# Patient Record
Sex: Female | Born: 1935 | Race: White | Hispanic: No | Smoking: Never smoker
Health system: Southern US, Community
[De-identification: ages and names within clinical notes are randomized; demographics above are authoritative.]

## PROBLEM LIST (undated history)

## (undated) DIAGNOSIS — K219 Gastro-esophageal reflux disease without esophagitis: Secondary | ICD-10-CM

## (undated) DIAGNOSIS — I350 Nonrheumatic aortic (valve) stenosis: Secondary | ICD-10-CM

## (undated) DIAGNOSIS — I10 Essential (primary) hypertension: Secondary | ICD-10-CM

## (undated) DIAGNOSIS — R9430 Abnormal result of cardiovascular function study, unspecified: Secondary | ICD-10-CM

## (undated) DIAGNOSIS — Z7901 Long term (current) use of anticoagulants: Secondary | ICD-10-CM

## (undated) DIAGNOSIS — I5030 Unspecified diastolic (congestive) heart failure: Secondary | ICD-10-CM

## (undated) DIAGNOSIS — E7800 Pure hypercholesterolemia, unspecified: Secondary | ICD-10-CM

## (undated) DIAGNOSIS — H3530 Unspecified macular degeneration: Secondary | ICD-10-CM

## (undated) DIAGNOSIS — I480 Paroxysmal atrial fibrillation: Secondary | ICD-10-CM

## (undated) DIAGNOSIS — M1990 Unspecified osteoarthritis, unspecified site: Secondary | ICD-10-CM

## (undated) DIAGNOSIS — G459 Transient cerebral ischemic attack, unspecified: Secondary | ICD-10-CM

## (undated) HISTORY — DX: Paroxysmal atrial fibrillation: I48.0

## (undated) HISTORY — DX: Unspecified diastolic (congestive) heart failure: I50.30

## (undated) HISTORY — DX: Nonrheumatic aortic (valve) stenosis: I35.0

## (undated) HISTORY — DX: Transient cerebral ischemic attack, unspecified: G45.9

## (undated) HISTORY — DX: Abnormal result of cardiovascular function study, unspecified: R94.30

## (undated) HISTORY — DX: Long term (current) use of anticoagulants: Z79.01

## (undated) HISTORY — DX: Gastro-esophageal reflux disease without esophagitis: K21.9

## (undated) HISTORY — DX: Unspecified osteoarthritis, unspecified site: M19.90

---

## 1998-01-09 ENCOUNTER — Other Ambulatory Visit: Admission: RE | Admit: 1998-01-09 | Discharge: 1998-01-09 | Payer: Self-pay | Admitting: *Deleted

## 1999-08-07 ENCOUNTER — Other Ambulatory Visit: Admission: RE | Admit: 1999-08-07 | Discharge: 1999-08-07 | Payer: Self-pay | Admitting: *Deleted

## 1999-09-15 ENCOUNTER — Ambulatory Visit (HOSPITAL_COMMUNITY): Admission: RE | Admit: 1999-09-15 | Discharge: 1999-09-15 | Payer: Self-pay | Admitting: Ophthalmology

## 1999-10-14 ENCOUNTER — Ambulatory Visit (HOSPITAL_COMMUNITY): Admission: RE | Admit: 1999-10-14 | Discharge: 1999-10-14 | Payer: Self-pay | Admitting: *Deleted

## 2000-09-14 ENCOUNTER — Encounter: Payer: Self-pay | Admitting: Internal Medicine

## 2000-09-14 ENCOUNTER — Inpatient Hospital Stay (HOSPITAL_COMMUNITY): Admission: AD | Admit: 2000-09-14 | Discharge: 2000-09-30 | Payer: Self-pay | Admitting: Internal Medicine

## 2000-09-14 ENCOUNTER — Encounter (INDEPENDENT_AMBULATORY_CARE_PROVIDER_SITE_OTHER): Payer: Self-pay

## 2000-09-14 ENCOUNTER — Encounter (INDEPENDENT_AMBULATORY_CARE_PROVIDER_SITE_OTHER): Payer: Self-pay | Admitting: Specialist

## 2000-09-15 ENCOUNTER — Encounter: Payer: Self-pay | Admitting: Internal Medicine

## 2000-09-17 ENCOUNTER — Encounter: Payer: Self-pay | Admitting: Nephrology

## 2000-11-03 ENCOUNTER — Other Ambulatory Visit: Admission: RE | Admit: 2000-11-03 | Discharge: 2000-11-03 | Payer: Self-pay | Admitting: *Deleted

## 2000-12-28 ENCOUNTER — Ambulatory Visit (HOSPITAL_COMMUNITY): Admission: RE | Admit: 2000-12-28 | Discharge: 2000-12-28 | Payer: Self-pay | Admitting: *Deleted

## 2001-11-28 ENCOUNTER — Other Ambulatory Visit: Admission: RE | Admit: 2001-11-28 | Discharge: 2001-11-28 | Payer: Self-pay | Admitting: *Deleted

## 2002-02-23 ENCOUNTER — Ambulatory Visit (HOSPITAL_COMMUNITY): Admission: RE | Admit: 2002-02-23 | Discharge: 2002-02-23 | Payer: Self-pay | Admitting: *Deleted

## 2002-05-17 ENCOUNTER — Encounter: Payer: Self-pay | Admitting: Ophthalmology

## 2002-05-22 ENCOUNTER — Ambulatory Visit (HOSPITAL_COMMUNITY): Admission: RE | Admit: 2002-05-22 | Discharge: 2002-05-22 | Payer: Self-pay | Admitting: Ophthalmology

## 2002-12-04 ENCOUNTER — Other Ambulatory Visit: Admission: RE | Admit: 2002-12-04 | Discharge: 2002-12-04 | Payer: Self-pay | Admitting: *Deleted

## 2003-03-01 ENCOUNTER — Ambulatory Visit (HOSPITAL_COMMUNITY): Admission: RE | Admit: 2003-03-01 | Discharge: 2003-03-01 | Payer: Self-pay | Admitting: *Deleted

## 2004-01-21 ENCOUNTER — Other Ambulatory Visit: Admission: RE | Admit: 2004-01-21 | Discharge: 2004-01-21 | Payer: Self-pay | Admitting: Obstetrics and Gynecology

## 2004-03-25 ENCOUNTER — Ambulatory Visit (HOSPITAL_COMMUNITY): Admission: RE | Admit: 2004-03-25 | Discharge: 2004-03-25 | Payer: Self-pay | Admitting: *Deleted

## 2004-11-05 ENCOUNTER — Encounter: Admission: RE | Admit: 2004-11-05 | Discharge: 2004-11-05 | Payer: Self-pay | Admitting: Gastroenterology

## 2005-03-26 ENCOUNTER — Ambulatory Visit (HOSPITAL_COMMUNITY): Admission: RE | Admit: 2005-03-26 | Discharge: 2005-03-26 | Payer: Self-pay | Admitting: *Deleted

## 2005-04-01 ENCOUNTER — Inpatient Hospital Stay (HOSPITAL_COMMUNITY): Admission: EM | Admit: 2005-04-01 | Discharge: 2005-04-05 | Payer: Self-pay | Admitting: Emergency Medicine

## 2005-04-02 ENCOUNTER — Ambulatory Visit: Payer: Self-pay | Admitting: Internal Medicine

## 2005-04-02 ENCOUNTER — Ambulatory Visit: Payer: Self-pay | Admitting: Cardiology

## 2005-04-02 ENCOUNTER — Encounter: Payer: Self-pay | Admitting: Cardiology

## 2005-08-31 ENCOUNTER — Ambulatory Visit (HOSPITAL_COMMUNITY): Admission: RE | Admit: 2005-08-31 | Discharge: 2005-08-31 | Payer: Self-pay | Admitting: Internal Medicine

## 2005-11-16 ENCOUNTER — Encounter: Admission: RE | Admit: 2005-11-16 | Discharge: 2005-11-16 | Payer: Self-pay | Admitting: Internal Medicine

## 2006-03-28 ENCOUNTER — Ambulatory Visit (HOSPITAL_COMMUNITY): Admission: RE | Admit: 2006-03-28 | Discharge: 2006-03-28 | Payer: Self-pay | Admitting: *Deleted

## 2006-08-31 ENCOUNTER — Encounter: Admission: RE | Admit: 2006-08-31 | Discharge: 2006-08-31 | Payer: Self-pay | Admitting: Internal Medicine

## 2007-04-03 ENCOUNTER — Ambulatory Visit (HOSPITAL_COMMUNITY): Admission: RE | Admit: 2007-04-03 | Discharge: 2007-04-03 | Payer: Self-pay | Admitting: Obstetrics and Gynecology

## 2008-04-09 ENCOUNTER — Ambulatory Visit (HOSPITAL_COMMUNITY): Admission: RE | Admit: 2008-04-09 | Discharge: 2008-04-09 | Payer: Self-pay | Admitting: Obstetrics and Gynecology

## 2009-04-17 ENCOUNTER — Ambulatory Visit (HOSPITAL_COMMUNITY): Admission: RE | Admit: 2009-04-17 | Discharge: 2009-04-17 | Payer: Self-pay | Admitting: Obstetrics and Gynecology

## 2010-05-26 ENCOUNTER — Ambulatory Visit (HOSPITAL_COMMUNITY): Admission: RE | Admit: 2010-05-26 | Discharge: 2010-05-26 | Payer: Self-pay | Admitting: Obstetrics and Gynecology

## 2010-09-24 ENCOUNTER — Inpatient Hospital Stay (HOSPITAL_COMMUNITY)
Admission: EM | Admit: 2010-09-24 | Discharge: 2010-09-25 | Payer: Self-pay | Source: Home / Self Care | Attending: Cardiovascular Disease | Admitting: Cardiovascular Disease

## 2010-10-13 HISTORY — PX: CARDIOVASCULAR STRESS TEST: SHX262

## 2010-12-14 LAB — CBC
HCT: 38.3 % (ref 36.0–46.0)
Hemoglobin: 12.1 g/dL (ref 12.0–15.0)
MCH: 32.6 pg (ref 26.0–34.0)
MCHC: 34.4 g/dL (ref 30.0–36.0)
MCHC: 34.7 g/dL (ref 30.0–36.0)
MCV: 93.9 fL (ref 78.0–100.0)
Platelets: 212 10*3/uL (ref 150–400)
Platelets: 264 10*3/uL (ref 150–400)
RBC: 4.08 MIL/uL (ref 3.87–5.11)
WBC: 11.3 10*3/uL — ABNORMAL HIGH (ref 4.0–10.5)

## 2010-12-14 LAB — PROTIME-INR
INR: 2.13 — ABNORMAL HIGH (ref 0.00–1.49)
Prothrombin Time: 23.1 seconds — ABNORMAL HIGH (ref 11.6–15.2)
Prothrombin Time: 24 seconds — ABNORMAL HIGH (ref 11.6–15.2)

## 2010-12-14 LAB — BASIC METABOLIC PANEL
BUN: 30 mg/dL — ABNORMAL HIGH (ref 6–23)
Calcium: 9.2 mg/dL (ref 8.4–10.5)
GFR calc Af Amer: 41 mL/min — ABNORMAL LOW (ref >60)
GFR calc Af Amer: 48 mL/min — ABNORMAL LOW (ref >60)
GFR calc non Af Amer: 34 mL/min — ABNORMAL LOW (ref >60)
GFR calc non Af Amer: 40 mL/min — ABNORMAL LOW (ref >60)
Glucose, Bld: 119 mg/dL — ABNORMAL HIGH (ref 70–99)
Glucose, Bld: 99 mg/dL (ref 70–99)
Sodium: 132 mEq/L — ABNORMAL LOW (ref 135–145)
Sodium: 134 mEq/L — ABNORMAL LOW (ref 135–145)

## 2010-12-14 LAB — HEPATIC FUNCTION PANEL
Alkaline Phosphatase: 59 U/L (ref 39–117)
Total Bilirubin: 0.5 mg/dL (ref 0.3–1.2)

## 2010-12-14 LAB — TROPONIN I: Troponin I: 0.03 ng/mL (ref 0.00–0.06)

## 2010-12-14 LAB — URINALYSIS, ROUTINE W REFLEX MICROSCOPIC
Bilirubin Urine: NEGATIVE
Glucose, UA: NEGATIVE mg/dL
Hgb urine dipstick: NEGATIVE
Protein, ur: NEGATIVE mg/dL
Specific Gravity, Urine: 1.012 (ref 1.005–1.030)
Urobilinogen, UA: 0.2 mg/dL (ref 0.0–1.0)

## 2010-12-14 LAB — CK TOTAL AND CKMB (NOT AT ARMC)
CK, MB: 1.9 ng/mL (ref 0.3–4.0)
Relative Index: 1.1 (ref 0.0–2.5)
Relative Index: 1.5 (ref 0.0–2.5)
Relative Index: 1.7 (ref 0.0–2.5)
Total CK: 152 U/L (ref 7–177)

## 2010-12-14 LAB — BRAIN NATRIURETIC PEPTIDE: Pro B Natriuretic peptide (BNP): 156 pg/mL — ABNORMAL HIGH (ref 0.0–100.0)

## 2010-12-14 LAB — URINE MICROSCOPIC-ADD ON

## 2011-02-19 NOTE — Discharge Summary (Signed)
NAMERASHON, REZEK NO.:  [id number]   MEDICAL RECORD NO.:  [id number]          PATIENT TYPE:  INP   LOCATION:  6711                         FACILITY:  [facility]   PHYSICIAN:  Barry Dienes. [name], M.D.DATE OF BIRTH:  1930/08/14   DATE OF ADMISSION:  04/01/2005  DATE OF DISCHARGE:                                 DISCHARGE SUMMARY   PERTINENT FINDINGS:  The patient is a 77 year old white female with a  history of paroxysmal atrial fibrillation and hypertension who presented to  the emergency room with acute onset of nausea, vomiting and diarrhea.  She  said that she was in her usual state of health until the evening prior to  admission.  After she awoke at approximately 5 a.m., she had the acute onset  of nausea and vomiting with green emesis followed by multiple watery stools.  She did have some epigastric pain which radiated to her back and was  associated with some shortness of breath.  In the emergency room, she had  one additional episode of nausea and vomiting and watery stools.  Her chest  pain and shortness of breath had subsided.  She denied any recent travel.  She had a ham sandwich and chicken salad sandwich on the day prior to  admission.  She has had no ill contacts and no recent antibiotic use.  She  also denied fever or chills.  She denied dysuria or other urinary tract  symptoms.   PAST MEDICAL HISTORY:  Paroxysmal atrial fibrillation on chronic Coumadin  treatment, hypertension, anemia, hyperlipidemia, history of acute renal  failure secondary to possible Vioxx and Diovan in December 2001,  osteoporosis, history of drug-induced lupus (Lopid), status post bladder  tack surgery, status post knee arthroscopy, status post cataract repair,  status post finger graft surgeries and status post March 2006 colonoscopy.   MEDICATIONS PRIOR TO ADMISSION:  1.  Prempro one tablet p.o. daily.  2. Coumadin 4 mg on Tuesday, Wednesday,      Thursday, Sunday, and  Sunday and 2 mg on Monday and Friday.  3. Zetia 10      mg daily.  4. Calcium 1800 mg daily.  5. Vitamin B6 one tablet daily.      6. Maxzide 37.5 mg/25 one tablet p.o. daily.  7. Lopressor 25 mg p.o.      b.i.d.  8. Prevacid 30 mg daily.  9. Provigil 200 mg daily.   ALLERGIES:  Penicillin, sulfa, Diovan, Vioxx, Lopid, Lipitor, Zocor, and the  extended release form of Toprol XL.   INITIAL PHYSICAL EXAMINATION:  VITAL SIGNS:  Temperature 99, blood pressure  111/56, pulse 90, respirations 30, pulse oxygen saturation 96% on room air.  GENERAL:  She was a mildly dehydrated white female who was in no apparent  distress.  HEENT:  Significant for a dry oropharynx.  NECK:  Supple without jugular venous distention or carotid bruits.  HEART:  Regular rate and rhythm without murmur or gallop.  CHEST:  Clear to auscultation.  ABDOMEN:  Soft and nondistended with normal bowel sounds and no tenderness.  EXTREMITIES:  Without clubbing, cyanosis or edema.  NEUROLOGIC:  She was alert and oriented x4 with no focal neurological  deficits.  SKIN:  Without rash.   LABORATORY DATA:  White blood cell count 3.4, hemoglobin 15.9, platelets  246.  Serum sodium 133, potassium 4.9, chloride 98, bicarbonate 23, BUN 30,  creatinine 1.2, glucose 133.  Urinalysis had 7-10 white blood cells with  positive leukocytes.  INR was 2.8.  Cardiac isoenzymes were normal.  EKG  showed a normal sinus rhythm of nonspecific ST segment changes.  Acute  abdomen series x-rays showed a gasless abdomen.   HOSPITAL COURSE:  She was admitted to a medical bed with telemetry.  She was  treated with IV fluids and anti-emetics for probable viral gastroenteritis.  She was started on enteric treatment with ciprofloxacin for possible urinary  tract infection.  Urine culture grew out Enterococcus species that was  sensitive to ampicillin, ciprofloxacin, vancomycin and Macrobid at greater  than 100,000 colony forming units/mL.  She also had  an ultrasound of the  abdomen done which was normal and a April 02, 2005, transthoracic  echocardiogram that showed normal left ventricular function with aortic  valve sclerosis without stenosis and left atrial enlargement at 42 mm with  normal being less than 40 mm.  On 04/02/05, she went into paroxysmal atrial  fibrillation with a heart rate ranging from 119 to 153.  She was seen by Dr.  Daleen Squibb from [facility] who felt that it was most likely that the  febrile illness caused the recurrent atrial fibrillation.  He recommended  the echocardiogram and increasing Lopressor to 25 mg three times daily.  He  also recommended an outpatient adenosine Cardiolite test for risk  stratification in approximately two weeks following discharge.  A TSH test  was done and normal at 1.9.  With the increase in Lopressor, she had no  further palpitations.  She did have mild hypertension on the low-dose  Lopressor which resolved with returning to her usual dosing of Lopressor.  On 04/04/05, she also had an MRI scan of the abdomen, results of which are  pending at the time of dictation.   PROCEDURES:  April 02, 2005, ultrasound of the abdomen; April 02, 2005,  transthoracic echocardiogram; April 04, 2005, MRI scan of the abdomen.   COMPLICATIONS:  None.   CONDITION ON DISCHARGE:  Her diarrhea has resolved and she is eating well.  She denies any shortness of breath, palpitations, or abdominal pain at  present.  Her most recent vital signs include a blood pressure 139/67, pulse  67, respirations 20, temperature 97.8, pulse oxygen saturation is 97% on  room air.  Her most recent labs include a white blood cell count of 4.7,  hemoglobin 12, hematocrit 35, platelets 87.  BT 28, INR 2.7.  Serum sodium  132, potassium 3.8, chloride 103, CO2 22, BUN 11, creatinine 0.9, glucose  102, serum albumin 2.6.   CURRENT EXAMINATION:  GENERAL:  She is in no acute distress while lying  supine. CHEST:  Clear to  auscultation.  HEART:  A regular rate and rhythm with a systolic ejection murmur grade 1/6  at the left sternal border.  ABDOMEN:  Normal bowel sounds with no tenderness or hepatosplenomegaly.  GAIT:  With gait examination, she was able to change from a sitting to a  standing position and walk in her room independently.   DISCHARGE DIAGNOSES:  1.  Viral gastroenteritis.  2.  Acute pyelonephritis.  3.  Anticoagulation.  4.  Hyperlipidemia.  5.  Osteopenia.  6.  Hypertension.  7.  Gastroesophageal reflux disease.  8.  Chronic insomnia and fatigue.  9.  Osteoarthritis of the knees and lumbar spine.  10. Paroxysmal atrial fibrillation and atrial flutter.  11. Thrombocytopenia.   DISCHARGE MEDICATIONS:  1.  Ciprofloxacin 250 mg one tablet p.o. b.i.d. x7 days.  2.  Coumadin 2 mg p.o. every day.  3.  Prempro one tablet p.o. daily.  4.  Zetia 10 mg p.o. daily.  5.  Citracal D 600 mg three times daily with meals.  6.  Maxzide 25 mg one tablet p.o. daily.  7.  Lopressor 25 mg p.o. b.i.d.  8.  Prevacid 30 mg p.o. daily.  9.  Provigil 200 mg p.o. q.a.m.  10. Tylenol 500 mg p.o. t.i.d. p.r.n. arthritis pain.   ACTIVITY RECOMMENDATIONS:  She may return to work on April 07, 2005, if  desired.   DIETARY RECOMMENDATIONS:  She was advised to not add salt to her foods.   SPECIAL INSTRUCTIONS:  She was advised to stop by our office on Thursday,  April 08, 2005, for recheck of her PT/INR test and complete blood count.  She  was also advised to call our office on Friday, April 09, 2005, for results of  her PT/INR testing.   FOLLOWUP PLAN:  She should have a followup appointment with Dr. Ivery Quale of [facility] in approximately two weeks following  discharge and was given a telephone number to call for that appointment.  She should have a followup adenosine Cardiolite test per the recommendations  of Dr. Juanito Doom at [facility] and could give their office a call to   confirm the date of the adenosine Cardiolite test.       DGP/MEDQ  D:  04/05/2005  T:  04/05/2005  Job:  [id number]   cc:   Thomas C. Wall, M.D.

## 2011-02-19 NOTE — H&P (Signed)
   NAME:  Madeline Serrano, Madeline Serrano                            ACCOUNT NO.:  [id number]   MEDICAL RECORD NO.:  [id number]                   PATIENT TYPE:  OIB   LOCATION:  2899                                 FACILITY:  [facility]   PHYSICIAN:  Guadelupe Sabin, M.D.             DATE OF BIRTH:  1930-05-22   DATE OF ADMISSION:  05/22/2002  DATE OF DISCHARGE:                                HISTORY & PHYSICAL   REASON FOR ADMISSION:  This was a planned outpatient surgical admission of  this 77 year old white female admitted for cataract/implant surgery of the  left eye.   PRESENT ILLNESS:  This patient had been noted to have cataract formations in  both eyes.  She was previously admitted on September 15, 1999, at which time  an extracapsular cataract extraction with primary insertion of a posterior  chamber intraocular lens implant of the right eye was performed.  The  patient did well and achieved good vision, 20/25+.  Meanwhile, the  unoperated left eye had deteriorated to 20/200 to 20/300.  The patient was  ready for similar cataract/implant surgery.  She signed an informed consent  and arrangements made for her outpatient admission at this time.   PAST MEDICAL HISTORY:  The patient is in stable general health under the  care of Dr. Barry Dienes. Madeline Serrano, her regular physician.  She had been taking  Coumadin for atrial fibrillation and this was discontinued four days prior  to admission.  Additional medications included Lopressor and Maxzide.   REVIEW OF SYSTEMS:  No cardiorespiratory complaints.   PHYSICAL EXAMINATION:  VITAL SIGNS:  As recorded on admission, blood  pressure 176/81, respirations 16, heart rate 67, normal sinus rhythm, and  temperature 97.  GENERAL APPEARANCE:  The patient is a pleasant, well-nourished, well-  developed 77 year old white female in no acute distress.  HEENT:  Eyes:  Visual acuity as noted above.  Slitlamp examination:  Nuclear  cataract formation, left eye.  Posterior  chamber well-centered implant,  right eye.  Fundus examination normal.  Applanation tonometry -- 16 mm each  eye.  CHEST:  Lungs clear to percussion and auscultation.  HEART:  Normal sinus rhythm.  No cardiomegaly.  No murmurs.  ABDOMEN:  Negative.  EXTREMITIES:  Negative.   ADMISSION DIAGNOSES:  1. Senile nuclear cataract, left eye.  2. Pseudophakia, right eye.   SURGICAL PLAN:  Cataract/implant surgery, left eye.                                               Guadelupe Sabin, M.D.    HNJ/MEDQ  D:  05/22/2002  T:  05/24/2002  Job:  [id number]

## 2011-02-19 NOTE — Consult Note (Signed)
[name], [name]                  ACCOUNT NO.:  [id number]   MEDICAL RECORD NO.:  [id number]          PATIENT TYPE:  INP   LOCATION:  6711                         FACILITY:  [facility]   PHYSICIAN:  Maisie Fus C. Wall, M.D.   DATE OF BIRTH:  07-31-1930   DATE OF CONSULTATION:  DATE OF DISCHARGE:                                   CONSULTATION   CONSULTING PHYSICIAN:  Maisie Fus C. Wall, M.D.   REFERRING PHYSICIAN:  Vania Rea. Jarold Motto, M.D. [facility].   We were asked by Dr. Jarold Motto to evaluate Madeline Serrano, a very pleasant 77-  year-old lady with paroxysmal atrial fibrillation.   She was admitted with nausea, vomiting, and an acute febrile illness.  She  went into atrial fib at a rate of 120-130.  She has a history of atrial fib,  last occurrence was in December 2001.  She has been on Coumadin.  Her INR  was therapeutic at 2.8 on admission.  She is now converted to a normal sinus  rhythm with no therapy.  She has had normal cardiac enzymes.   PAST MEDICAL HISTORY:  1.  Hypertension.  2.  Hyperlipidemia.  3.  History of angina with the last stress test ten years ago in [place], [place].  4.  She has chronic anemia.  5.  Drug-induced lupus secondary to Lopid in 1994.  6.  Acute renal failure, December 2001, question Vioxx versus Diovan.  7.  She has had cataract surgery.  8.  Arthroscopic knee surgery bilaterally.  9.  Osteoporosis.  10. Bladder tack procedure.   She is intolerant of:  1.  PENICILLIN.  2.  SULFA.  3.  LOPID.  4.  BIAXIN.  5.  STATINS.  6.  DIOVAN.  7.  VIOXX.  8.  TOPROL.   MEDICINES PRIOR TO ADMISSION:  1.  Coumadin 4 mg a day, except two on Mondays and Fridays.  2.  Zetia 10 mg a day.  3.  Maxzide 37.5/25 every day.  4.  Lopressor 25 b.i.d.  5.  Prevacid 30 mg a day.  6.  Prempro.  7.  Calcium.  8.  Vitamin B-6.   SOCIAL HISTORY:  She is living in [place], and she is widowed.  She has  two children.  She has never smoked and never drunk.  She  does not drink  coffee.  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   REVIEW OF SYSTEMS:  Noncontributory.   Her father died at age 62, had his first myocardial infarction at age 72.   PHYSICAL EXAMINATION:  VITAL SIGNS:  Blood pressure today is 109/62, her  pulse is currently 70 and regular.  Respiratory rate is 24.  Her temp is  98.3.  GENERAL:  She is in no acute distress.  She is very pleasant.  SKIN:  Warm and dry.  HEENT:  Normocephalic, atraumatic.  PERRLA.  Extraocular movements intact.  Sclerae are clear.  NECK:  Reveals no JVD.  Carotid upstrokes are full bilaterally without  bruits.  There is no obvious  lymphadenopathy.  CARDIOVASCULAR:  Reveals an S4 but no murmur.  LUNGS:  Clear to auscultation percussion.  ABDOMEN:  Soft.  Good bowel sounds.  Nontender.  There is no rebound.  There  is no hepatosplenomegaly.  EXTREMITIES:  Reveal good pulses bilaterally with bilateral femoral bruits,  left greater than right.  Distal pulses are intact.  There is no edema.  NEURO:  Intact.   Chest x-ray shows no evidence of congestive heart failure.   Electrocardiogram showed initially atrial fib, now shows sinus rhythm.  No  ST segment changes.   LABORATORY DATA:  Remarkable for an AST of 55.   ASSESSMENT:  1.  Paroxysmal atrial fibrillation with rapid ventricular rate, now      spontaneously converted to a normal sinus rhythm.  She has a history of      paroxysmal atrial fibrillation but this has not occurred since December      2001.  2.  Chronic Coumadin with therapeutic anticoagulation.  3.  Acute febrile illness which probably precipitated number one.  4.  Hypertension, stable.  5.  Hyperlipidemia, on Zetia and she is intolerant of Statins.  6.  Elevated liver function tests.  7.  Femoral bruits consistent with peripheral vascular disease.  8.  Multiple drug allergies.   RECOMMENDATIONS:  1.  A 2D echocardiogram.  2.  Increase Lopressor 25 t.i.d.  3.   Check TSH.  4.  Outpatient followup with me for risk stratification with a stress      Myoview.  We will make the arrangements.   Thank you for the consultation.       TCW/MEDQ  D:  04/02/2005  T:  04/02/2005  Job:  [id number]   cc:   Vania Rea. Jarold Motto, M.D. [facility]   Maisie Fus C. Wall, M.D.

## 2011-02-19 NOTE — Consult Note (Signed)
[place]  Patient:    Madeline Serrano, Madeline Serrano                           MRN: [id number] Proc. Date: 09/15/00 Adm. Date:  [id number] Attending:  Tresa Garter                          Consultation Report  CONSULTING PHYSICIAN:  Sonda Primes, M.D. [facility]  REASON FOR CONSULTATION:  Renal failure.  HISTORY OF PRESENT ILLNESS:  This is a 77 year old female with a long history of hypertension over 15 years, a history of chest pain in the past, but apparently has been cleared for coronary disease, history of hyperlipidemia and history of DJD, who on Saturday and Sunday had significant nausea, vomiting and diarrhea.  This was thought to be a viral illness and she was treated with an antidiarrheal and antiemetic.  We do not know the name of those.  Monday, p.m. she started developing some blisters on her hands, arms, and her face.  ALLERGIC REACTIONS:  She was admitted on September 14, 2000, for probable allergic reaction.  She also 2 weeks ago had her chronic Sectral switched to labetalol. 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  The antilipidemic drug caused hair loss and another caused myalgias.  She has a history of a large reaction with rash to PENICILLIN and SULFA and swelling of her ankles with BIAXIN and some other antihypertensives that she was tried on in June of this last year.  CHRONIC MEDICATIONS AT HOME: 1. Prempro. 2. Diovan. 3. Vioxx. 4. Lasix. 5. Norvasc 6. Prilosec.  She has been short of breath a week, for about 2 days and walking about, not at rest.  She usually has no PND and sleeps on 2 pillows.  She has nocturia x 2.  She has no cough, edema, nausea or vomiting.  She has a history of UTIs, she says 4 or 5 in the past but none for the last 4-5 years.  She has no chronic rash, but she has very dry skin.  She has DJD after having arthroscopy at both knees.  FAMILY  HISTORY:  She has no family history of renal disease and no history of stones.  Her husband does have renal disease from diabetes, however.  PAST MEDICAL HISTORY:  Includes allergies as listed above.  She has a history of drug induced lupus, hypertension, coronary artery disease, menopause, gastroesophageal reflux disease, hyperlipidemia, history of bladder suspension, traumatic loss of 2 fingers with over 21 operations on that, a history of cataract also done.  Medications are listed above.  REVIEW OF SYSTEMS:  HEENT:  No decreased vision but she does wear glasses. No sores in her mouth or her throat, no difficulty on swallowing. CARDIOVASCULAR:  As listed above.  PULMONARY:  She is a nonsmoker.  She has no cough or asthma. GI:  As above but now that has resolved with the nausea and vomiting.  She does have some appetite.  SKIN:  As above.  She has some blisters on her hands, face and arms.  SOCIAL HISTORY:  Both parents are deceased.  She has two children who are healthy.  She is a nonsmoker and nondrinker and she is married.  PHYSICAL EXAMINATION:  GENERAL:  Pale, obese and anxious.  VITAL SIGNS:  Blood pressure  is 154/62 supine going to 130/60 standing.  HEENT:  Benign other than her skin.  SKIN:  Reveals a resolving blister in the left maxillae.  She had some small blisters anywhere from 1 to 4 cm in diameter on her arms and the backs of her hands, and a few on the palms.  CARDIOVASCULAR:  Regular rhythm, S4, Grade 2/6 holosystolic murmur without edema.  Pulses are 2+/4+; there is a left femoral bruit.  PMI is 12 cm lateral to the sternal fifth intercostal space.  LUNGS:  Reveal no rales, rhonchi or wheezes.  ABDOMEN:  Obese, positive bowel sounds.  SKIN:  As above but also shows some [employer] spots.  MUSCULOSKELETAL:  Some mild knee deformity.  The second and third digits in her right hand are obviously not conventional finger digits and look to  be grafts with erythema and the skin is very tight over them and erythematous.  LABORATORY AND ACCESSORY DATA:  Ultrasound reveals 12 and 13 cm kidneys with normal cortex.  Chest x-ray is unremarkable.  DISCUSSION: 1. Renal failure, most likely acute with most possibilities and differential    including acute tubular necrosis secondary to Vioxx and/or Diovan, acute    allergic interstitial findings related to reaction to labetalol or    antiemetics and I would favor labetalol. 2. 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  Most likely    this is acute tubular necrosis secondary to Vioxx and Diovan in the    setting of decreased volumes based on her history as well as based on the    urine sediment. A question of some chronic component to her disease.  This    usually takes longer than 3 or 4 days to develop with her degree of renal    insufficiency.  She is acidotic and oliguric but not uremic at this time.    Potassium is borderline, volume is okay.  Chemistries this morning revealed    sodium 123, potassium 5.2, chloride 94, bicarbonate 16, creatinine 11, BUN    97, glucose 183.  CK is only 45, urine sodium is 28, urinalysis 30 mg    percent protein, 0-5 red cells, 0-5 white cells.  ASSESSMENT: 1. Acute allergic reaction.  She is on steroids and she will have a    significant catabolic state. 2. Hypertension. Would use a beta blocker and calcium channel blocker and    diuretics. 3. Coronary artery disease. 4. Degenerative joint disease.  PLAN: 1. IV normal saline. 2. Urine sodium and creatinine. 3. 24-hour urine for protein and creatinine. 4. Repeat her chemistries this afternoon. 5. May need acute dialysis, but should recover. 6. AVOID NSAIDs, NARBs/ACE INHIBITOR. DD:  09/15/00 TD:  09/15/00 Job: [id number]

## 2011-02-19 NOTE — H&P (Signed)
NAMEZENIAH, BRINEY NO.:  [id number]   MEDICAL RECORD NO.:  [id number]          PATIENT TYPE:  INP   LOCATION:  6711                         FACILITY:  [facility]   PHYSICIAN:  Kari Baars, M.D.  DATE OF BIRTH:  09-18-1930   DATE OF ADMISSION:  04/01/2005  DATE OF DISCHARGE:                                HISTORY & PHYSICAL   CHIEF COMPLAINT:  Nausea, vomiting, diarrhea.   HISTORY OF PRESENT ILLNESS:  Ms. Tomassi is a 77 year old white female patient  of Dr. Silvano Rusk with a history of paroxysmal atrial fibrillation,  hypertension who presented to the emergency department this morning with  acute onset of nausea, vomiting, and diarrhea.  She states that she was in  her usual state of health last evening when she went to bed.  However, this  morning after awakening around 5 a.m. she developed nausea and vomiting with  green emesis followed by multiple watery stools.  She did have some  epigastric pain which radiated to her back associated with some shortness of  breath.  In the emergency department she had one additional episode of  nausea, vomiting, and watery stools but her chest pain and shortness of  breath has improved.  She denies any recent travel.  She ate a ham sandwich  and chicken salad sandwich yesterday but both of these were fresh.  She had  no sick contacts.  She denies any recent antibiotic use.  She does endorse  fevers, chills, and sweats.  She denies dysuria or other urinary symptoms.   REVIEW OF SYSTEMS:  All systems were reviewed and are negative except as in  the HPI with the following exceptions:  Leg cramps, weakness, decreased  appetite.   PAST MEDICAL HISTORY:  1.  Paroxysmal atrial fibrillation on chronic Coumadin therapy.  2.  Hypertension.  3.  Anemia.  4.  Hyperlipidemia.  5.  History of acute renal failure secondary to possible Vioxx and Diovan      therapy (December 2001).  6.  Osteoporosis.  7.  Status post bladder tack.  8.   Status post knee arthroscopy.  9.  Status post cataract repair.  10. History of drug-induced lupus (Lopid).  11. Status post finger graft surgeries.  12. Colonoscopy March 2006.   MEDICATIONS:  1.  Prempro.  2.  Coumadin 4 mg Tuesday, Wednesday, Thursday, Saturday, Sunday, and 2 mg      Monday and Friday.  3.  Zetia 10 mg daily.  4.  Calcium total of 1800 mg daily.  5.  B6.  6.  Maxzide 37.5/25 daily.  7.  Lopressor 25 mg b.i.d.  8.  Prevacid 30 mg daily.  9.  Provigil 200 mg daily.   ALLERGIES:  She has multiple allergies including PENICILLIN, SULFA, DIOVAN,  VIOXX, LOPID, LIPITOR, ZOCOR, and TOPROL XL.   FAMILY HISTORY:  Father had an MI and diabetes.  Her mother had COPD.  She  had a son with testicular cancer.   SOCIAL HISTORY:  She lives alone and is a widow.  She has two children  including  a son who accompanied her to the emergency department.  She denies  tobacco, alcohol, or drug use.   PHYSICAL EXAMINATION:  VITAL SIGNS:  Temperature 99, blood pressure 111/56,  pulse 90, respirations 30, oxygen saturation 96% on room air.  GENERAL:  She is mildly dehydrated.  HEENT:  Oropharynx is dry.  Pupils are equal, round, and reactive to light.  Extraocular movements are intact.  NECK:  Supple without lymphadenopathy, JVD, or carotid bruits.  HEART:  Regular rate and rhythm without murmurs, rubs, or gallops.  LUNGS:  Clear to auscultation bilaterally.  ABDOMEN:  Soft, nondistended, nontender with normoactive bowel sounds.  EXTREMITIES:  No clubbing, cyanosis, edema.  She does have right finger  grafts due to prior amputations.  She has no CVA tenderness.  NEUROLOGIC:  Alert and oriented x4 with nonfocal neurologic examination.  SKIN:  No rashes.   LABORATORIES:  CBC significant for a white count of 3.4, hemoglobin 15.9,  platelets 246.  BMET significant for sodium 133, potassium 4.9, chloride 98,  bicarbonate 23, BUN 30, creatinine 1.2, glucose 133.  Urinalysis shows  7-10  white blood cells with positive leukocytes on dipstick.  INR is 2.8.  Cardiac enzymes are negative.   STUDIES:  EKG shows normal sinus rhythm with nonspecific ST changes.  Acute  abdominal series shows a gasless abdomen.   ASSESSMENT/PLAN:  1.  Nausea, vomiting, diarrhea.  This is likely viral gastroenteritis      associated with mild dehydration.  Superimposed urinary tract infection      may be contributing.  Will treat her supportively with Phenergan,      antidiarrheals, and intravenous fluids.  Clear liquid diet will be      advanced as tolerated.  Will check LFTs to rule out gallbladder disease.      Check urine and blood cultures to rule out urosepsis.  Rule out      myocardial infarction with serial cardiac enzymes.  2.  Urinary tract infection.  Cipro 500 mg b.i.d. for seven days.  Follow up      on urine cultures to direct further therapy.  3.  Abdominal pain secondary to #1.  Will check liver function tests to rule      out gallbladder disease as well as amylase and lipase.  PPI daily.  4.  Paroxysmal atrial fibrillation.  Continue Coumadin per protocol and      Lopressor for rate control.  5.  Hypertension.  Continue her home medications.  6.  Fluids, electrolytes, nutrition.  Clear liquid diet.  Advance as      tolerated.  Normal saline 100 mL/hour.  7.  Disposition.  Probable discharge in one to two days if she is tolerating      p.o. intake.       WS/MEDQ  D:  04/01/2005  T:  04/01/2005  Job:  [id number]

## 2011-02-19 NOTE — Discharge Summary (Signed)
[facility]  Patient:    Madeline Serrano, Madeline Serrano                           MRN: [id number] Adm. Date:  [id number] Disc. Date: [id number] Attending:  Cassandria Serrano Dictator:   Madeline Serrano, P.A. CC:         Madeline Serrano, M.D.  Madeline Serrano, M.D. [facility]   Discharge Summary  DISCHARGE DIAGNOSES: 1. Acute renal failure. 2. Acute tubular necrosis. 3. Paroxysmal atrial fibrillation. 4. Erythema multiforme. 5. Enterococcal urinary tract infection.  BRIEF ADMISSION HISTORY:  Madeline Serrano is a 77 year old white female who presented to the office with complaints of fatigue, rash, inability to urinate, and weakness.  Prior to this admission, the patient was seen in the office by Madeline Serrano and was given an antiemetic and antidiarrheal.  The patient had results with these medications; however, on the day of admission she presented with shortness of breath and blisters on her face and hands.  PAST MEDICAL HISTORY: 1. History of drug-induced lupus from Lopid. 2. Hypertension. 3. Coronary artery disease. 4. Menopause. 5. Gastroesophageal reflux disease. 6. Hyperlipidemia. 7. History of bladder suspension. 8. Traumatic loss of two fingers in the remote past.  HOSPITAL COURSE: #1 - DERMATOLOGY:  The patient presented with blistering rash.  Most likely thought to be a drug reaction and we were concerned about Stevens-Johnson syndrome.  Patient was started on IV steroids and Claritin.  A dermatology consult was obtained.  This was most likely consistent with erythema multiforme major.  A biopsy was obtained and pathology was indeed consistent with erythema multiforme with extensive epidermal necrosis.  ______ saw the patient and did agree with steroids but did not feel there was any benefit from the Claritin and this was discontinued.  The patients blistering lesions were treated with Bacitracin ointment.  The lesions on her hands were treated with the  Bacitracin ointment as well as Kerlix wraps.  The patients lesions now have significantly improved.  The patient has slowly been tapered off of steroids.  The etiology of the reaction is felt to be most likely secondary to the labetalol that she was recently started on; however, this cannot be confirmed.  #2 - NEPHROLOGY:  The patient presented after prolonged nausea, vomiting, and diarrhea.  On admission, the patients creatinine was found to be 11 with BUN of 97.  The patient had had minimal urinary output.  We did ask for nephrology to see the patient, whose differential included ATN secondary to Vioxx or Diovan, or AIN secondary to oliguric reaction to the labetalol or antiemetics, or acute glomerulonephritis secondary to an amino complex disease; also acute SLE with differential.  Nephrology discussed dialysis with the patient.  She did have an Ash catheter placed and underwent dialysis for the first time on September 17, 2000.  Patient underwent dialysis six times.  Renals final diagnosis was that this was acute renal failure secondary to acute tubular necrosis, most likely drug-induced.  The patients Ash catheter has been removed and she is urinating on her own.  Her renal function has significantly improved.  Of course, the patient will need to avoid some of the medications she came in on, including Diovan, Lasix, Vioxx, and labetalol.  These have all been discontinued.  #3 - INFECTIOUS DISEASE:  The patient developed low grade fever and a significant white count.  Urine was cultured and grew Enterococcus.  Patient received a full  course of renal-dose Tequin.  The patients white count has resolved and she has remained afebrile.  The patients Foley catheter has been discontinued.  #4 - PAROXYSMAL ATRIAL FIBRILLATION:  On September 21, 2000 after hemodialysis, the patient developed rapid heart rate in the 130s to 140s.  EKG was consistent with atrial fibrillation.  Patient was  transferred to 2900 and cardiology was asked to see the patient.  The patient has a history of heart palpitations and had been on Sectral and then labetalol in the past.  Dr. Caryl Serrano saw the patient and recommended using IV Lopressor.  Patient spontaneously converted to sinus rhythm.  A 2-D echo was within normal limits and there was no indication for further anticoagulation.  He did recommend Lopressor b.i.d.  He also feels the patient may benefit from a stress test once she is stabilized.  #5 - HYPERTENSION:  The patient had poorly controlled blood pressure.  As noted, most of her antihypertensives were discontinued on admission.  We did start the patient on some clonidine with improvement, and then Lopressor was added for rate control.  However, the patient did develop some orthostatic hypotension and these two medications were held.  Now that the patient has been more ambulatory, the patients orthostatic hypotension has improved and we will resume the Lopressor at discharge.  At this time, we will not start clonidine but will defer this to her primary physician.  #6 - ANEMIA:  The patient was mildly anemic.  Iron studies were not consistent with iron deficiency.  Nephrology felt this could be as a result to her acute renal failure and did give her some Epogen.  The patient has remained hemodynamically stable.  LABORATORY AT DISCHARGE:  BUN 40, creatinine 2.7.  Potassium 3.5.  Also, orthostatic blood pressure at discharge:  Lying down, her blood pressure was 177/83; sitting, her blood pressure was 161/81; standing, her blood pressure was 168/70.  MEDICATIONS AT DISCHARGE: 1. Prednisone 10 mg one tablet for six days, then one-half tablet for seven    days, then stop. 2. Lopressor 50 mg b.i.d. 3. Prilosec as needed. 4. Prempro as at home.  She has been instructed not to take Diovan, Norvasc, Lasix, Vioxx, or labetalol.  FOLLOW-UP:  She is to follow up with Madeline Serrano Friday,  October 07, 2000 at  2:15 p.m. DD:  09/30/00 TD:  09/30/00 Job: [id number]

## 2011-02-19 NOTE — Op Note (Signed)
NAME:  Madeline Serrano, Madeline Serrano                            ACCOUNT NO.:  [id number]   MEDICAL RECORD NO.:  [id number]                   PATIENT TYPE:  OIB   LOCATION:  2899                                 FACILITY:  [facility]   PHYSICIAN:  Guadelupe Sabin, M.D.             DATE OF BIRTH:  02-05-1930   DATE OF PROCEDURE:  05/22/2002  DATE OF DISCHARGE:                                 OPERATIVE REPORT   PREOPERATIVE DIAGNOSIS:  Senile nuclear cataract, left eye.   POSTOPERATIVE DIAGNOSIS:  Senile nuclear cataract, left eye.   OPERATION:  Planned extracapsular cataract extraction --  phacoemulsification, primary insertion of posterior chamber intraocular lens  implant.   SURGEON:  Guadelupe Sabin, M.D.   ASSISTANT:  Nurse.   ANESTHESIA:  Local 0.4% Xylocaine, 0.75% Marcaine retrobulbar block; topical  tetracaine; intraocular Xylocaine; anesthesia standby required in this  cardiac patient, patient given sodium Pentothal intravenously during the  period of retrobulbar blocking.   DESCRIPTION OF PROCEDURE:  After the patient was prepped and draped, a lid  speculum was inserted in the left eye.  The eye was turned downward with a  superior rectus traction suture.  Schiotz tonometry was recorded at 7 to 8  scale units with a 5.5 g weight.  A peritomy was performed adjacent to the  limbus from the 11 to 1 o'clock position.  The corneoscleral junction was  cleaned and a corneoscleral groove made with a 45 degree Superblade.  The  anterior chamber was then entered with the 2.5-mm diamond keratome at the 12  o'clock position and a 15 degree blade at the 2:30 position.  Using a bent  26-gauge needle on a Healon syringe, a circular capsulorrhexis was begun and  then completed with the Grabow forceps.  Hydrodissection and  hydrodelineation were performed using 1% Xylocaine.  A 30 degree  phacoemulsification tip was then inserted and the nucleus flipped  perpendicular to the usual plane of the lens.   With back-chopping and  fragmentation, the lens was emulsified quickly in 54 seconds, average power  level -- 20%, total amount of fluid used -- 50 cc.  Following removal of the  nucleus, the residual cortex was aspirated with the irrigation-aspiration  tip.  The posterior capsule appeared intact with a brilliant red fundus  reflex.  It was therefore elected to insert an [employer]  [id number] silicone three-piece posterior chamber intraocular lens implant with  UV absorber, diopter strength +12.50.  This was inserted with the McDonald  forceps into the anterior chamber and then centered into the capsular bag  using the [facility] lens rotator; the lens appeared to be well-centered.  The  Healon which had been used throughout the procedure was aspirated and  replaced with balanced salt solution and Miochol ophthalmic solution.  The  12 o'clock incision did not appear to be completely self-sealing and it was  elected to place one  10-0 interrupted nylon suture across the incision to  ensure closure.  Maxitrol ointment was instilled in the conjunctival cul-de-  sac and a light patch and protective shield applied.  Duration of procedure  and anesthesia administration -- 45 minutes.  The patient tolerated the  procedure well in general and left the operating room for the recovery room  in good condition.                                               Guadelupe Sabin, M.D.   HNJ/MEDQ  D:  05/22/2002  T:  05/24/2002  Job:  [id number]

## 2011-03-25 ENCOUNTER — Observation Stay (HOSPITAL_COMMUNITY)
Admission: EM | Admit: 2011-03-25 | Discharge: 2011-03-26 | Disposition: A | Discharge status: Home / Self Care | Payer: Medicare Other | Attending: Internal Medicine | Admitting: Internal Medicine

## 2011-03-25 DIAGNOSIS — R002 Palpitations: Secondary | ICD-10-CM | POA: Insufficient documentation

## 2011-03-25 DIAGNOSIS — I4891 Unspecified atrial fibrillation: Principal | ICD-10-CM | POA: Insufficient documentation

## 2011-03-25 DIAGNOSIS — R0602 Shortness of breath: Secondary | ICD-10-CM | POA: Insufficient documentation

## 2011-03-25 DIAGNOSIS — R079 Chest pain, unspecified: Secondary | ICD-10-CM | POA: Insufficient documentation

## 2011-03-26 ENCOUNTER — Emergency Department (HOSPITAL_COMMUNITY): Payer: Medicare Other

## 2011-03-26 LAB — CBC
HCT: 33.9 % — ABNORMAL LOW (ref 36.0–46.0)
Hemoglobin: 11.8 g/dL — ABNORMAL LOW (ref 12.0–15.0)
MCH: 31.7 pg (ref 26.0–34.0)
MCHC: 34.8 g/dL (ref 30.0–36.0)
MCV: 91.1 fL (ref 78.0–100.0)
MCV: 91.5 fL (ref 78.0–100.0)
Platelets: 225 10*3/uL (ref 150–400)
RDW: 13.5 % (ref 11.5–15.5)
WBC: 8 10*3/uL (ref 4.0–10.5)

## 2011-03-26 LAB — PROTIME-INR
INR: 2.59 — ABNORMAL HIGH (ref 0.00–1.49)
INR: 2.62 — ABNORMAL HIGH (ref 0.00–1.49)
Prothrombin Time: 28.2 seconds — ABNORMAL HIGH (ref 11.6–15.2)
Prothrombin Time: 28.4 seconds — ABNORMAL HIGH (ref 11.6–15.2)

## 2011-03-26 LAB — BASIC METABOLIC PANEL
Calcium: 9.8 mg/dL (ref 8.4–10.5)
GFR calc Af Amer: 46 mL/min — ABNORMAL LOW (ref >60)
GFR calc non Af Amer: 38 mL/min — ABNORMAL LOW (ref >60)
Glucose, Bld: 113 mg/dL — ABNORMAL HIGH (ref 70–99)
Sodium: 131 mEq/L — ABNORMAL LOW (ref 135–145)

## 2011-03-26 LAB — POCT I-STAT, CHEM 8
BUN: 37 mg/dL — ABNORMAL HIGH (ref 6–23)
Chloride: 102 mEq/L (ref 96–112)
HCT: 35 % — ABNORMAL LOW (ref 36.0–46.0)
Potassium: 4.3 mEq/L (ref 3.5–5.1)
Sodium: 129 mEq/L — ABNORMAL LOW (ref 135–145)

## 2011-03-26 LAB — TROPONIN I: Troponin I: <0.3 ng/mL (ref <0.30)

## 2011-04-01 NOTE — Discharge Summary (Signed)
  NAMEARLENA, MARSAN NO.:  [id number]  MEDICAL RECORD NO.:  [id number]  LOCATION:  3707                         FACILITY:  [facility]  PHYSICIAN:  Landry Corporal, MD DATE OF BIRTH:  1929/11/12  DATE OF ADMISSION:  03/25/2011 DATE OF DISCHARGE:  03/26/2011                              DISCHARGE SUMMARY   DISCHARGE DIAGNOSES:  Atrial fibrillation, symptomatic, controlled ventricular rate.  HOSPITAL COURSE:  Ms. Tullis is a 77 year old female with history of AFib in December 2011.  She converted to sinus rhythm after rate controlled at that time.  She presented back to [facility] with AFib, controlled ventricular response.  She states she has had Coumadin on and off for the last 9 years and has been on Coumadin since that time.  She was started on flecainide 50 mg b.i.d.  She had one dose in the ER. Overnight, she spontaneously converted back to normal sinus rhythm.  She had her second dose of flecainide at 10 o'clock this morning.  She was seen by Dr. Herbie Baltimore, feels she is stable for discharge.  The patient has no complaints at this time.  DISCHARGE LABS:  WBCs 8.0, hemoglobin 12.4, hematocrit 36.5, platelets 225.  PT 28.4, INR 2.62.  Sodium 131, potassium 4.6, chloride 98, carbon dioxide 24, glucose 113, BUN 32, creatinine 1.34, troponin less than 0.30, calcium 9.8.  STUDIES/PROCEDURES:  Chest x-ray no acute findings.  Lungs, clear. Heart size normal.  DISCHARGE MEDICATIONS: 1. Flecainide 50 mg 1 tablet by mouth twice daily. 2. Allegra 180 mg over-the-counter 1 tablet by mouth daily as needed     for allergies. 3. Benicar 40 mg 1 tablet by mouth every morning. 4. Calcium citrate 1800 mg over-the-counter 1 tablet by mouth daily. 5. Carvedilol 12.5 mg 1 tablet by mouth twice daily with meals. 6. Coumadin 4 mg tablets 1 tablet on Monday, Wednesday, Friday, and     1/2 tablet on Sunday, Tuesday, Thursday, and Saturday. 7. Crestor 10 mg 1-1/2 tablet by mouth  Monday, Wednesday, Friday. 8. Multivitamin 1 tablet by mouth daily. 9. Omega 3 fatty acids 1 capsule by mouth daily. 10.Prempro 0.3/1.5 mg 1 tablet 5 days a week none on Tuesdays and     Fridays. 11.Prevacid over-the-counter 1 tablet by mouth daily. 12.Triamterene/hydrochlorothiazide 75/50 mg 1-1/2 tablet by mouth     daily. 13.Valium 2 mg one-quarter tablet by mouth daily at bedtime as needed     for sleep. 14.Vitamin B6 tablet by mouth daily 200 mg.  FINAL DISPOSITION:  Ms. Grether will be discharged home in stable condition.  Recommended to increase her activity slowly.  It is recommended she eats heart-healthy diet.  She will follow up with Dr. Allyson Sabal in approximately 2-3 weeks.  Our office will call with the appointment time.    ______________________________ Wilburt Finlay, PA   ______________________________ Landry Corporal, MD    BH/MEDQ  D:  03/26/2011  T:  03/27/2011  Job:  [id number]  cc:   Nanetta Batty, M.D. Kari Baars, M.D.  Electronically Signed by Wilburt Finlay PA on 03/29/2011 11:57:15 AMElectronically Signed by Bryan Lemma MD on 04/01/2011 07:59:15 PM

## 2011-05-31 ENCOUNTER — Other Ambulatory Visit (HOSPITAL_COMMUNITY): Payer: Self-pay | Admitting: Obstetrics and Gynecology

## 2011-05-31 DIAGNOSIS — Z1231 Encounter for screening mammogram for malignant neoplasm of breast: Secondary | ICD-10-CM

## 2011-06-09 ENCOUNTER — Ambulatory Visit (HOSPITAL_COMMUNITY)
Admission: RE | Admit: 2011-06-09 | Discharge: 2011-06-09 | Disposition: A | Discharge status: Home / Self Care | Payer: Medicare Other | Source: Ambulatory Visit | Attending: Obstetrics and Gynecology | Admitting: Obstetrics and Gynecology

## 2011-06-09 DIAGNOSIS — Z1231 Encounter for screening mammogram for malignant neoplasm of breast: Secondary | ICD-10-CM | POA: Insufficient documentation

## 2011-08-08 ENCOUNTER — Other Ambulatory Visit: Payer: Self-pay

## 2011-08-08 ENCOUNTER — Encounter: Payer: Self-pay | Admitting: *Deleted

## 2011-08-08 ENCOUNTER — Emergency Department (HOSPITAL_COMMUNITY)
Admission: EM | Admit: 2011-08-08 | Discharge: 2011-08-08 | Discharge status: Against Medical Advice | Payer: Medicare Other | Attending: Emergency Medicine | Admitting: Emergency Medicine

## 2011-08-08 DIAGNOSIS — E789 Disorder of lipoprotein metabolism, unspecified: Secondary | ICD-10-CM | POA: Insufficient documentation

## 2011-08-08 DIAGNOSIS — R0602 Shortness of breath: Secondary | ICD-10-CM | POA: Insufficient documentation

## 2011-08-08 DIAGNOSIS — I4891 Unspecified atrial fibrillation: Secondary | ICD-10-CM | POA: Insufficient documentation

## 2011-08-08 DIAGNOSIS — I10 Essential (primary) hypertension: Secondary | ICD-10-CM | POA: Insufficient documentation

## 2011-08-08 DIAGNOSIS — Z79899 Other long term (current) drug therapy: Secondary | ICD-10-CM | POA: Insufficient documentation

## 2011-08-08 HISTORY — DX: Essential (primary) hypertension: I10

## 2011-08-08 HISTORY — DX: Pure hypercholesterolemia, unspecified: E78.00

## 2011-08-08 NOTE — ED Notes (Addendum)
Pt son came to the nurses station to voice his frustration over pt wait.  Went to speak with PA who agreed to see pt, on my arrival to her room pt appears to have left.  Monitor cables and pulseox have been removed and are laying on the bed.  PA notified

## 2011-08-08 NOTE — ED Notes (Signed)
Pt reports intermittent back pain x 1 week, having sob and palpitations, feels like she is in atrial fib. HR 68, NSR on ekg done at triage. resp e/u.

## 2011-08-08 NOTE — ED Provider Notes (Signed)
History     CSN: [id number] Arrival date & time: 08/08/2011  4:04 PM   None     Chief Complaint  Patient presents with  . Shortness of Breath    (Consider location/radiation/quality/duration/timing/severity/associated sxs/prior treatment) HPI  Past Medical History  Diagnosis Date  . A-fib   . Hypertension   . High cholesterol     History reviewed. No pertinent past surgical history.  History reviewed. No pertinent family history.  History  Substance Use Topics  . Smoking status: Never Smoker   . Smokeless tobacco: Not on file  . Alcohol Use: No    OB History    Grav Para Term Preterm Abortions TAB SAB Ect Mult Living                  Review of Systems  Allergies  Biaxin; Celebrex; Statins; Sulfa antibiotics; Vioxx; and Penicillins  Home Medications   Current Outpatient Rx  Name Route Sig Dispense Refill  . CALCIUM CITRATE 950 MG PO TABS Oral Take 2 tablets by mouth daily.      Marland Kitchen CARVEDILOL 12.5 MG PO TABS Oral Take 12.5 mg by mouth 2 (two) times daily with a meal.      . FEXOFENADINE HCL 180 MG PO TABS Oral Take 180 mg by mouth daily as needed. For allergies     . OMEGA-3 FATTY ACIDS 1000 MG PO CAPS Oral Take by mouth daily.      Marland Kitchen FLECAINIDE ACETATE 50 MG PO TABS Oral Take 50 mg by mouth 2 (two) times daily.      Carma Leaven M PLUS PO TABS Oral Take 1 tablet by mouth daily.      Marland Kitchen OLMESARTAN MEDOXOMIL 40 MG PO TABS Oral Take 40 mg by mouth at bedtime.      Marland Kitchen PYRIDOXINE HCL 200 MG PO TABS Oral Take 200 mg by mouth daily.      . TRIAMTERENE-HCTZ 50-25 MG PO CAPS Oral Take 0.5 capsules by mouth every morning.      . WARFARIN SODIUM 2 MG PO TABS Oral Take 2 mg by mouth daily. Takes 2mg  4 days a week     . WARFARIN SODIUM 4 MG PO TABS Oral Take 4 mg by mouth daily. Takes 4mg  three days week       BP 173/74  Pulse 63  Temp(Src) 97.7 F (36.5 C) (Oral)  Resp 22  SpO2 100%  Physical Exam  ED Course  Procedures (including critical care time)  Labs  Reviewed - No data to display No results found.   No diagnosis found.    MDM    PATIENT LEFT AMA, WAS NOT SEEN BY A MEDICAL PROVIDER. NO HPI/ROS/PE was performed.        Demetrius Charity, [place] 08/08/11 2022

## 2011-08-12 NOTE — ED Provider Notes (Signed)
Medical screening examination/treatment/procedure(s) were performed by non-physician practitioner and as supervising physician I was immediately available for consultation/collaboration.  Raeford Razor, MD 08/12/11 [phone]

## 2011-10-07 ENCOUNTER — Other Ambulatory Visit (HOSPITAL_COMMUNITY): Payer: Self-pay | Admitting: Internal Medicine

## 2011-10-07 DIAGNOSIS — R0602 Shortness of breath: Secondary | ICD-10-CM

## 2011-10-12 ENCOUNTER — Encounter (HOSPITAL_COMMUNITY): Payer: Medicare Other

## 2011-10-13 ENCOUNTER — Ambulatory Visit (HOSPITAL_COMMUNITY)
Admission: RE | Admit: 2011-10-13 | Discharge: 2011-10-13 | Disposition: A | Discharge status: Home / Self Care | Payer: Medicare Other | Source: Ambulatory Visit | Attending: Internal Medicine | Admitting: Internal Medicine

## 2011-10-13 DIAGNOSIS — R0602 Shortness of breath: Secondary | ICD-10-CM | POA: Insufficient documentation

## 2012-03-20 HISTORY — PX: OTHER SURGICAL HISTORY: SHX169

## 2012-06-29 ENCOUNTER — Inpatient Hospital Stay (HOSPITAL_COMMUNITY)
Admission: EM | Admit: 2012-06-29 | Discharge: 2012-07-05 | DRG: 470 | Disposition: A | Discharge status: Rehabilitation Facility | Payer: Medicare Other | Attending: Internal Medicine | Admitting: Internal Medicine

## 2012-06-29 ENCOUNTER — Encounter (HOSPITAL_COMMUNITY): Payer: Self-pay | Admitting: Adult Health

## 2012-06-29 ENCOUNTER — Emergency Department (HOSPITAL_COMMUNITY): Payer: Medicare Other

## 2012-06-29 DIAGNOSIS — M8440XA Pathological fracture, unspecified site, initial encounter for fracture: Secondary | ICD-10-CM | POA: Diagnosis present

## 2012-06-29 DIAGNOSIS — J4489 Other specified chronic obstructive pulmonary disease: Secondary | ICD-10-CM | POA: Diagnosis present

## 2012-06-29 DIAGNOSIS — E785 Hyperlipidemia, unspecified: Secondary | ICD-10-CM | POA: Diagnosis present

## 2012-06-29 DIAGNOSIS — W010XXA Fall on same level from slipping, tripping and stumbling without subsequent striking against object, initial encounter: Secondary | ICD-10-CM | POA: Diagnosis present

## 2012-06-29 DIAGNOSIS — S72001A Fracture of unspecified part of neck of right femur, initial encounter for closed fracture: Secondary | ICD-10-CM | POA: Diagnosis present

## 2012-06-29 DIAGNOSIS — Y92009 Unspecified place in unspecified non-institutional (private) residence as the place of occurrence of the external cause: Secondary | ICD-10-CM

## 2012-06-29 DIAGNOSIS — D62 Acute posthemorrhagic anemia: Secondary | ICD-10-CM | POA: Diagnosis not present

## 2012-06-29 DIAGNOSIS — S72009A Fracture of unspecified part of neck of unspecified femur, initial encounter for closed fracture: Secondary | ICD-10-CM

## 2012-06-29 DIAGNOSIS — I4892 Unspecified atrial flutter: Secondary | ICD-10-CM | POA: Diagnosis present

## 2012-06-29 DIAGNOSIS — I129 Hypertensive chronic kidney disease with stage 1 through stage 4 chronic kidney disease, or unspecified chronic kidney disease: Secondary | ICD-10-CM | POA: Diagnosis present

## 2012-06-29 DIAGNOSIS — N189 Chronic kidney disease, unspecified: Secondary | ICD-10-CM | POA: Diagnosis present

## 2012-06-29 DIAGNOSIS — I4891 Unspecified atrial fibrillation: Secondary | ICD-10-CM | POA: Diagnosis present

## 2012-06-29 DIAGNOSIS — E871 Hypo-osmolality and hyponatremia: Secondary | ICD-10-CM | POA: Diagnosis present

## 2012-06-29 DIAGNOSIS — M810 Age-related osteoporosis without current pathological fracture: Secondary | ICD-10-CM | POA: Diagnosis present

## 2012-06-29 DIAGNOSIS — D689 Coagulation defect, unspecified: Secondary | ICD-10-CM

## 2012-06-29 DIAGNOSIS — I10 Essential (primary) hypertension: Secondary | ICD-10-CM | POA: Diagnosis present

## 2012-06-29 DIAGNOSIS — R339 Retention of urine, unspecified: Secondary | ICD-10-CM | POA: Diagnosis present

## 2012-06-29 DIAGNOSIS — Z7901 Long term (current) use of anticoagulants: Secondary | ICD-10-CM

## 2012-06-29 DIAGNOSIS — S72033A Displaced midcervical fracture of unspecified femur, initial encounter for closed fracture: Principal | ICD-10-CM | POA: Diagnosis present

## 2012-06-29 DIAGNOSIS — J449 Chronic obstructive pulmonary disease, unspecified: Secondary | ICD-10-CM | POA: Diagnosis present

## 2012-06-29 HISTORY — DX: Unspecified macular degeneration: H35.30

## 2012-06-29 LAB — CBC WITH DIFFERENTIAL/PLATELET
Basophils Absolute: 0 10*3/uL (ref 0.0–0.1)
Basophils Relative: 0 % (ref 0–1)
Eosinophils Absolute: 0.2 10*3/uL (ref 0.0–0.7)
Eosinophils Relative: 2 % (ref 0–5)
HCT: 32.5 % — ABNORMAL LOW (ref 36.0–46.0)
MCHC: 35.7 g/dL (ref 30.0–36.0)
MCV: 88.8 fL (ref 78.0–100.0)
Monocytes Absolute: 1.1 10*3/uL — ABNORMAL HIGH (ref 0.1–1.0)
RDW: 13 % (ref 11.5–15.5)

## 2012-06-29 LAB — URINALYSIS, MICROSCOPIC ONLY
Glucose, UA: NEGATIVE mg/dL
Protein, ur: NEGATIVE mg/dL
pH: 6.5 (ref 5.0–8.0)

## 2012-06-29 LAB — BASIC METABOLIC PANEL
BUN: 36 mg/dL — ABNORMAL HIGH (ref 6–23)
CO2: 20 mEq/L (ref 19–32)
CO2: 23 mEq/L (ref 19–32)
Calcium: 9.6 mg/dL (ref 8.4–10.5)
Chloride: 88 mEq/L — ABNORMAL LOW (ref 96–112)
Chloride: 91 mEq/L — ABNORMAL LOW (ref 96–112)
Creatinine, Ser: 1.41 mg/dL — ABNORMAL HIGH (ref 0.50–1.10)
GFR calc Af Amer: 40 mL/min — ABNORMAL LOW (ref >90)
GFR calc non Af Amer: 30 mL/min — ABNORMAL LOW (ref >90)
Glucose, Bld: 152 mg/dL — ABNORMAL HIGH (ref 70–99)
Potassium: 4.5 mEq/L (ref 3.5–5.1)
Sodium: 123 mEq/L — ABNORMAL LOW (ref 135–145)

## 2012-06-29 LAB — CBC
HCT: 30.3 % — ABNORMAL LOW (ref 36.0–46.0)
Hemoglobin: 10.7 g/dL — ABNORMAL LOW (ref 12.0–15.0)
MCH: 31.9 pg (ref 26.0–34.0)
RBC: 3.35 MIL/uL — ABNORMAL LOW (ref 3.87–5.11)

## 2012-06-29 MED ORDER — TIOTROPIUM BROMIDE MONOHYDRATE 18 MCG IN CAPS
18.0000 ug | ORAL_CAPSULE | Freq: Every day | RESPIRATORY_TRACT | Status: DC
  Administered 2012-06-29 – 2012-07-05 (×7): 18 ug via RESPIRATORY_TRACT
  Filled 2012-06-29 (×2): qty 5

## 2012-06-29 MED ORDER — DEXTROSE 5 % IV SOLN
5.0000 mg | Freq: Once | INTRAVENOUS | Status: AC
  Administered 2012-06-29: 5 mg via INTRAVENOUS
  Filled 2012-06-29: qty 0.5

## 2012-06-29 MED ORDER — CALCIUM CARBONATE 1250 (500 CA) MG PO TABS
1.0000 | ORAL_TABLET | Freq: Three times a day (TID) | ORAL | Status: DC
  Administered 2012-06-29 – 2012-07-05 (×16): 500 mg via ORAL
  Filled 2012-06-29 (×22): qty 1

## 2012-06-29 MED ORDER — VITAMIN B-6 100 MG PO TABS
200.0000 mg | ORAL_TABLET | Freq: Two times a day (BID) | ORAL | Status: DC
  Administered 2012-06-29 – 2012-07-05 (×8): 200 mg via ORAL
  Filled 2012-06-29 (×14): qty 2

## 2012-06-29 MED ORDER — HYDROMORPHONE HCL PF 1 MG/ML IJ SOLN
0.5000 mg | INTRAMUSCULAR | Status: DC | PRN
Start: 2012-06-29 — End: 2012-06-29
  Administered 2012-06-29: 0.5 mg via INTRAVENOUS
  Filled 2012-06-29: qty 1

## 2012-06-29 MED ORDER — ACETAMINOPHEN 325 MG PO TABS: 650.0000 mg | ORAL_TABLET | Freq: Four times a day (QID) | ORAL | Status: DC | PRN

## 2012-06-29 MED ORDER — OCUVITE-LUTEIN PO CAPS
1.0000 | ORAL_CAPSULE | Freq: Every day | ORAL | Status: DC
  Administered 2012-06-29 – 2012-07-05 (×7): 1 via ORAL
  Filled 2012-06-29 (×9): qty 1

## 2012-06-29 MED ORDER — POLYETHYLENE GLYCOL 3350 17 G PO PACK: 17.0000 g | PACK | Freq: Every day | ORAL | Status: DC | PRN

## 2012-06-29 MED ORDER — PANTOPRAZOLE SODIUM 40 MG PO TBEC
40.0000 mg | DELAYED_RELEASE_TABLET | Freq: Every day | ORAL | Status: DC
  Administered 2012-06-29 – 2012-07-05 (×7): 40 mg via ORAL
  Filled 2012-06-29 (×7): qty 1

## 2012-06-29 MED ORDER — SODIUM CHLORIDE 0.9 % IV SOLN: INTRAVENOUS | Status: DC

## 2012-06-29 MED ORDER — HYDROMORPHONE HCL PF 1 MG/ML IJ SOLN
0.5000 mg | INTRAMUSCULAR | Status: AC | PRN
Start: 2012-06-29 — End: 2012-06-29
  Administered 2012-06-29: 13:00:00 via INTRAVENOUS
  Filled 2012-06-29: qty 1

## 2012-06-29 MED ORDER — DOCUSATE SODIUM 100 MG PO CAPS
100.0000 mg | ORAL_CAPSULE | Freq: Two times a day (BID) | ORAL | Status: DC
  Administered 2012-06-29 – 2012-07-05 (×12): 100 mg via ORAL
  Filled 2012-06-29 (×14): qty 1

## 2012-06-29 MED ORDER — FENTANYL CITRATE 0.05 MG/ML IJ SOLN
50.0000 ug | INTRAMUSCULAR | Status: DC | PRN
  Administered 2012-06-29: 50 ug via INTRAVENOUS
  Filled 2012-06-29: qty 2

## 2012-06-29 MED ORDER — LORATADINE 10 MG PO TABS
10.0000 mg | ORAL_TABLET | Freq: Every day | ORAL | Status: DC
  Administered 2012-06-29 – 2012-07-03 (×4): 10 mg via ORAL
  Filled 2012-06-29 (×7): qty 1

## 2012-06-29 MED ORDER — FLECAINIDE ACETATE 50 MG PO TABS
50.0000 mg | ORAL_TABLET | Freq: Two times a day (BID) | ORAL | Status: DC
  Administered 2012-06-29 – 2012-07-05 (×13): 50 mg via ORAL
  Filled 2012-06-29 (×16): qty 1

## 2012-06-29 MED ORDER — HYDROCODONE-ACETAMINOPHEN 5-325 MG PO TABS
1.0000 | ORAL_TABLET | ORAL | Status: DC | PRN
  Administered 2012-06-29: 2 via ORAL
  Administered 2012-06-30: 1 via ORAL
  Administered 2012-06-30 – 2012-07-04 (×6): 2 via ORAL
  Filled 2012-06-29 (×9): qty 2

## 2012-06-29 MED ORDER — ACETAMINOPHEN 650 MG RE SUPP: 650.0000 mg | Freq: Four times a day (QID) | RECTAL | Status: DC | PRN

## 2012-06-29 MED ORDER — WHITE PETROLATUM GEL
Status: AC
  Filled 2012-06-29: qty 5

## 2012-06-29 MED ORDER — ICAPS AREDS FORMULA PO TABS: 1.0000 | ORAL_TABLET | Freq: Every day | ORAL | Status: DC

## 2012-06-29 MED ORDER — SODIUM CHLORIDE 0.9 % IV SOLN
INTRAVENOUS | Status: DC
  Administered 2012-06-29 – 2012-06-30 (×2): via INTRAVENOUS
  Administered 2012-06-30: 100 mL/h via INTRAVENOUS
  Administered 2012-07-02 – 2012-07-03 (×3): via INTRAVENOUS

## 2012-06-29 MED ORDER — ONDANSETRON HCL 4 MG/2ML IJ SOLN: 4.0000 mg | Freq: Three times a day (TID) | INTRAMUSCULAR | Status: AC | PRN

## 2012-06-29 MED ORDER — VITAMIN B-6 100 MG PO TABS: 200.0000 mg | ORAL_TABLET | Freq: Two times a day (BID) | ORAL | Status: DC

## 2012-06-29 MED ORDER — CALCIUM CARBONATE 600 MG PO TABS
600.0000 mg | ORAL_TABLET | Freq: Three times a day (TID) | ORAL | Status: DC
  Filled 2012-06-29 (×4): qty 1

## 2012-06-29 MED ORDER — ALUM & MAG HYDROXIDE-SIMETH 200-200-20 MG/5ML PO SUSP: 30.0000 mL | Freq: Four times a day (QID) | ORAL | Status: DC | PRN

## 2012-06-29 MED ORDER — CARVEDILOL 12.5 MG PO TABS
12.5000 mg | ORAL_TABLET | Freq: Two times a day (BID) | ORAL | Status: DC
  Administered 2012-06-29 – 2012-07-03 (×7): 12.5 mg via ORAL
  Filled 2012-06-29 (×12): qty 1

## 2012-06-29 NOTE — ED Notes (Signed)
Slipped in bathroom at home one hour ago after taking a valium for tendonitis and bursitis of left arm, c/o right hip and groin pain, right 2nd toe avulsion of nail. Right leg shortening and external rotation noted. Pain rated 5/10, toes are warm and pedal pulse 2, able to wiggle toes.

## 2012-06-29 NOTE — ED Notes (Signed)
Right foot second toe cleaned and dressed

## 2012-06-29 NOTE — ED Notes (Signed)
Right foot cleaned of dried blood.  Second toe nail is pulled up

## 2012-06-29 NOTE — ED Provider Notes (Signed)
History     CSN: [id number]  Arrival date & time 06/29/12  0115   First MD Initiated Contact with Patient 06/29/12 0125      Chief Complaint  Patient presents with  . Fall    (Consider location/radiation/quality/duration/timing/severity/associated sxs/prior treatment) HPI Comments: 78 year old female with a history of atrial fibrillation on Coumadin, hypertension, high cholesterol presents after a mechanical fall while ambulating from her bathroom to her bed. She states that she tripped, landing on her right hip, has some difficulty getting up and was able to finally get to a phone to call her son for help. She states that the pain is persistent, worse with trying to move the right hip, not associated with any numbness or weakness and no associated neck pain or head injury. She has a prior history of surgery to her left upper extremity  Dr. Danella [facility]on [facility]ics  And Dr. Clelia Croft of [facility]  Patient is a 78 y.o. female presenting with fall. The history is provided by the patient, a relative and the EMS personnel.  Fall Pertinent negatives include no fever, no numbness, no abdominal pain, no nausea, no vomiting and no headaches.    Past Medical History  Diagnosis Date  . A-fib   . Hypertension   . High cholesterol   . Macular degeneration     History reviewed. No pertinent past surgical history.  History reviewed. No pertinent family history.  History  Substance Use Topics  . Smoking status: Never Smoker   . Smokeless tobacco: Not on file  . Alcohol Use: No    OB History    Grav Para Term Preterm Abortions TAB SAB Ect Mult Living                  Review of Systems  Constitutional: Negative for fever and chills.  HENT: Negative for sore throat and neck pain.   Eyes: Negative for visual disturbance.  Respiratory: Negative for cough and shortness of breath.   Cardiovascular: Negative for chest pain.  Gastrointestinal: Negative for nausea,  vomiting, abdominal pain and diarrhea.  Genitourinary: Negative for dysuria and frequency.  Musculoskeletal: Positive for joint swelling. Negative for back pain.  Skin: Positive for wound. Negative for rash.  Neurological: Negative for weakness, numbness and headaches.  Hematological: Negative for adenopathy.  Psychiatric/Behavioral: Negative for behavioral problems.    Allergies  Celebrex; Clarithromycin; Statins; Sulfa antibiotics; Vioxx; and Penicillins  Home Medications   Current Outpatient Rx  Name Route Sig Dispense Refill  . CALCIUM CARBONATE 600 MG PO TABS Oral Take 600 mg by mouth 3 (three) times daily with meals.    Marland Kitchen CARVEDILOL 12.5 MG PO TABS Oral Take 18.75 mg by mouth 2 (two) times daily with a meal.     . CONJ ESTROG-MEDROXYPROGEST ACE 0.3-1.5 MG PO TABS Oral Take 1 tablet by mouth daily. 5 days a week Does not take on Tuesday and Friday    . OMEGA-3 FATTY ACIDS 1000 MG PO CAPS Oral Take 1 g by mouth daily.     Marland Kitchen FLECAINIDE ACETATE 50 MG PO TABS Oral Take 50 mg by mouth 2 (two) times daily.      Marland Kitchen LANSOPRAZOLE 15 MG PO CPDR Oral Take 15 mg by mouth every other day.    . ICAPS AREDS FORMULA PO TABS Oral Take 1 tablet by mouth daily.    Marland Kitchen OLMESARTAN MEDOXOMIL 20 MG PO TABS Oral Take 20 mg by mouth daily.    Marland Kitchen PYRIDOXINE HCL 200  MG PO TABS Oral Take 200 mg by mouth 2 (two) times daily.     Marland Kitchen ROSUVASTATIN CALCIUM 10 MG PO TABS Oral Take 5 mg by mouth every Monday, Wednesday, and Friday.     Marland Kitchen TIOTROPIUM BROMIDE MONOHYDRATE 18 MCG IN CAPS Inhalation Place 18 mcg into inhaler and inhale daily.    . TRIAMTERENE-HCTZ 75-50 MG PO TABS Oral Take 0.5 tablets by mouth daily.    . WARFARIN SODIUM 4 MG PO TABS Oral Take 2-4 mg by mouth daily. Takes 1 tablet on Monday, and Friday Takes 1/2 tablet the rest of the week    . FEXOFENADINE HCL 180 MG PO TABS Oral Take 180 mg by mouth daily as needed. For allergies       BP 118/71  Temp 97.9 F (36.6 C) (Oral)  Resp 18  SpO2  100%  Physical Exam  Nursing note and vitals reviewed. Constitutional: She appears well-developed and well-nourished.       Uncomfortable appearing  HENT:  Head: Normocephalic and atraumatic.  Mouth/Throat: Oropharynx is clear and moist. No oropharyngeal exudate.  Eyes: Conjunctivae normal and EOM are normal. Pupils are equal, round, and reactive to light. Right eye exhibits no discharge. Left eye exhibits no discharge. No scleral icterus.  Neck: Normal range of motion. Neck supple. No JVD present. No thyromegaly present.  Cardiovascular: Normal rate, normal heart sounds and intact distal pulses.  Exam reveals no gallop and no friction rub.   No murmur heard.      Atrial fibrillation, strong peripheral pulses at the arteries  Pulmonary/Chest: Effort normal and breath sounds normal. No respiratory distress. She has no wheezes. She has no rales.  Abdominal: Soft. Bowel sounds are normal. She exhibits no distension and no mass. There is no tenderness.  Musculoskeletal: Normal range of motion. She exhibits tenderness ( pain with ROM of the R hip, slightly shortened leg on the R.  avulsed toenail). She exhibits no edema.  Lymphadenopathy:    She has no cervical adenopathy.  Neurological: She is alert. Coordination normal.  Skin: Skin is warm and dry. No rash noted. No erythema.  Psychiatric: She has a normal mood and affect. Her behavior is normal.    ED Course  Procedures (including critical care time)  Labs Reviewed  BASIC METABOLIC PANEL - Abnormal; Notable for the following:    Sodium 123 (*)     Chloride 88 (*)     Glucose, Bld 127 (*)     BUN 36 (*)     Creatinine, Ser 1.41 (*)     GFR calc non Af Amer 34 (*)     GFR calc Af Amer 40 (*)     All other components within normal limits  CBC WITH DIFFERENTIAL - Abnormal; Notable for the following:    WBC 13.2 (*)     RBC 3.66 (*)     Hemoglobin 11.6 (*)     HCT 32.5 (*)     Neutrophils Relative 79 (*)     Neutro Abs 10.4 (*)      Lymphocytes Relative 10 (*)     Monocytes Absolute 1.1 (*)     All other components within normal limits  PROTIME-INR - Abnormal; Notable for the following:    Prothrombin Time 50.1 (*)     INR 6.12 (*)     All other components within normal limits  URINALYSIS, MICROSCOPIC ONLY - Abnormal; Notable for the following:    APPearance CLOUDY (*)  Leukocytes, UA LARGE (*)     Bacteria, UA MANY (*)     Squamous Epithelial / LPF MANY (*)     All other components within normal limits  TYPE AND SCREEN  ABO/RH   Dg Chest 1 View  06/29/2012  *RADIOLOGY REPORT*  Clinical Data: Fall.  Right hip pain.  CHEST - 1 VIEW  Comparison: 03/26/2011.  Findings:  Cardiopericardial silhouette within normal limits. Mediastinal contours normal. Trachea midline.  No airspace disease or effusion.  Low volumes are present.  Stable prominence of left a perihilar pulmonary artery.  Multiple loose bodies are present in the left axillary pouch with left right humeral osteoarthritis.  IMPRESSION: Low volume chest.  No active cardiopulmonary disease.   Original Report Authenticated By: Andreas Newport, M.D.    Dg Hip Complete Right  06/29/2012  *RADIOLOGY REPORT*  Clinical Data: Fall.  Right hip pain.  RIGHT HIP - COMPLETE 2+ VIEW  Comparison: None.  Findings: Bilateral SI joint degenerative disease.  Partially visualized lumbar spondylosis.  The obturator rings appear intact. There is abnormal lucency in the right greater trochanter.  The right hip is externally rotated.  There is angulation in the lateral margin of the femoral head. Additionally, there is irregular lucency through the greater trochanter.   On the frontal view of the pelvis, the fracture is more evident, particularly with step-off deformity along the medial inferior femoral head.  IMPRESSION: Right proximal femur fracture.  The fracture is probably sub capital but may have intertrochanteric extension as well.  Probable mild impaction.  Fracture is suboptimally  visualized due to external rotation of the hip.   Original Report Authenticated By: Andreas Newport, M.D.      1. Hip fracture   2. Coagulopathy   3. Hyponatremia       MDM  Possible hip fracture, x-rays pending, pain medication, labs, coags, chest x-ray. Overall the patient appears stable without signs of head injury or neck pain. She is neurologically intact and n.p.o.   D/w Dr. Despina Hick who will see this AM, d/w Dr. Clelia Croft who agrees with temp admit orders.   xrays reviews - has hip fracture, is hyponatremic and coagulopathic.  Stable appearing overall.  ED ECG REPORT  I personally interpreted this EKG   Date: 06/29/2012   Rate: 96  Rhythm: atrial fibrillation  QRS Axis: normal  Intervals: normal  ST/T Wave abnormalities: nonspecific T wave changes  Conduction Disutrbances:nonspecific intraventricular conduction delay  Narrative Interpretation:   Old EKG Reviewed: Compared with 08/08/2011, no significant changes other than this faster rate   Vida Roller, MD 06/29/12 [phone]

## 2012-06-29 NOTE — Consult Note (Signed)
Reason for Consult:  Right Hip Pain following a fall  Referring Physician: Dr. Buren Kos  Madeline Serrano is an 78 y.o. female.  HPI: Madeline Serrano is an 78 year old white female with a history of paroxysmal atrial fibrillation on anticoagulation, moderate COPD, and hypertension who presented to the emergency department with a complaint of right groin pain following a fall.  She is an independent ambulator who sustained a right hip injury during a fall late last night/early this morning at home.  She had gone to the bathroom and was on her way back to bed when she apparently got tripped up and fell injuring the right hip.  She had been having problems sleeping for two nights prior to the fall and had taken some valium to help her rest.  She states that she may have been a little more groggy.  She landed on the floor near a chair and was able to pull herself up and eventually get back into bed.  She called her son who came over to let EMS in the house.  She is a widow who lives alone.  She was brought over to [facility] ED where x-rays revealed a nondisplaced right femoral subcapital neck fracture.  She was admitted by Dr. Clelia Croft and consulted Dr. Lequita Halt who was on call for [facility] last evening.  She takes coumadin for a history of atrial fib.  Her INR was elevated at 6.12 on admission and will be followed until at which point she will be able to undergo surgery.   She denies any syncopal or presyncopal episode associated with the fall. She is concerned about surgery having been on coumadin.  She understands that the coumadin will be held and she will not undergo surgery until her INR is back to a reasonable level.  Vitamin K has been ordered and her INR will be followed.  Past Medical History  Diagnosis Date  . A-fib   . Hypertension   . High cholesterol   . Macular degeneration    PSH: 1984 Bladder Procedure  1999 Left Knee Scope 2001 Right Knee Scope  Bilateral Cataract Procedures Multiple  surgeries on the Right Hand - Index and Long fingers  Family History: Father deceased 74: MI, DM2  Mother: deceased 71: COPD +smoker  Siblings: none  Children: 2 sons- 1 with testicular cancer  Family History: (+) early CAD, (+) DM, (-) colon cancer, (-) breast cancer   Social History:  She reports that she has never smoked. She does not have any smokeless tobacco history on file. She reports that she does not drink alcohol or use illicit drugs.  Widow. No ambulatory devices prior to fall.  Allergies:  Allergies  Allergen Reactions  . Celebrex (Celecoxib) Swelling  . Clarithromycin Swelling  . Statins Other (See Comments)    Hair fell out  . Sulfa Antibiotics Other (See Comments)    unknown  . Vioxx (Rofecoxib) Other (See Comments)    Renal problems  . Penicillins Rash    Current Medications:  Scheduled       calcium carbonate 600 mg, PO, TID WC Ordered    carvedilol 12.5 mg, PO, BID WC Ordered    docusate sodium 100 mg, PO, BID Ordered    flecainide 50 mg, PO, BID Ordered    loratadine 10 mg, PO, Daily Ordered    multivitamin-lutein (OCUVITE-LUTEIN) capsule 1 capsule, PO, Daily Ordered    pantoprazole 40 mg, PO, Daily Ordered    phytonadione (VITAMIN K) IV 5  mg, IV, Once Ordered    tiotropium 18 mcg, IN, Daily Ordered    vitamin B-6 (pyridOXINE) tablet 200 mg, PO, BID Ordered    white petrolatum No Dose/Rate,  Ordered   PRN     acetaminophen 650 mg, RE, Q6H PRN Ordered    acetaminophen 650 mg, PO, Q6H PRN Ordered    alum & mag hydroxide-simeth 30 mL, PO, Q6H PRN Ordered    HYDROcodone-acetaminophen 1-2 tablet, PO, Q4H PRN Ordered    HYDROmorphone 0.5 mg, IV, Q2H PRN Ordered    ondansetron 4 mg, IV, Q8H PRN Ordered    polyethylene glycol 17 g, PO, Daily PRN Ordered        Results for orders placed during the hospital encounter of 06/29/12 (from the past 48 hour(s))  URINALYSIS, MICROSCOPIC ONLY     Status: Abnormal   Collection Time   06/29/12  1:35 AM       Component Value Range Comment   Color, Urine YELLOW  YELLOW    APPearance CLOUDY (*) CLEAR    Specific Gravity, Urine 1.014  1.005 - 1.030    pH 6.5  5.0 - 8.0    Glucose, UA NEGATIVE  NEGATIVE mg/dL    Hgb urine dipstick NEGATIVE  NEGATIVE    Bilirubin Urine NEGATIVE  NEGATIVE    Ketones, ur NEGATIVE  NEGATIVE mg/dL    Protein, ur NEGATIVE  NEGATIVE mg/dL    Urobilinogen, UA 0.2  0.0 - 1.0 mg/dL    Nitrite NEGATIVE  NEGATIVE    Leukocytes, UA LARGE (*) NEGATIVE    WBC, UA 21-50  <3 WBC/hpf    RBC / HPF 0-2  <3 RBC/hpf    Bacteria, UA MANY (*) RARE    Squamous Epithelial / LPF MANY (*) RARE    Urine-Other SOME WBC'S IN CLUMPS     BASIC METABOLIC PANEL     Status: Abnormal   Collection Time   06/29/12  2:15 AM      Component Value Range Comment   Sodium 123 (*) 135 - 145 mEq/L    Potassium 4.1  3.5 - 5.1 mEq/L    Chloride 88 (*) 96 - 112 mEq/L    CO2 20  19 - 32 mEq/L    Glucose, Bld 127 (*) 70 - 99 mg/dL    BUN 36 (*) 6 - 23 mg/dL    Creatinine, Ser 1.61 (*) 0.50 - 1.10 mg/dL    Calcium 9.6  8.4 - 09.6 mg/dL    GFR calc non Af Amer 34 (*) >90 mL/min    GFR calc Af Amer 40 (*) >90 mL/min   CBC WITH DIFFERENTIAL     Status: Abnormal   Collection Time   06/29/12  2:15 AM      Component Value Range Comment   WBC 13.2 (*) 4.0 - 10.5 K/uL    RBC 3.66 (*) 3.87 - 5.11 MIL/uL    Hemoglobin 11.6 (*) 12.0 - 15.0 g/dL    HCT 04.5 (*) 40.9 - 46.0 %    MCV 88.8  78.0 - 100.0 fL    MCH 31.7  26.0 - 34.0 pg    MCHC 35.7  30.0 - 36.0 g/dL    RDW 81.1  91.4 - 78.2 %    Platelets 278  150 - 400 K/uL    Neutrophils Relative 79 (*) 43 - 77 %    Neutro Abs 10.4 (*) 1.7 - 7.7 K/uL    Lymphocytes Relative 10 (*) 12 - 46 %  Lymphs Abs 1.4  0.7 - 4.0 K/uL    Monocytes Relative 9  3 - 12 %    Monocytes Absolute 1.1 (*) 0.1 - 1.0 K/uL    Eosinophils Relative 2  0 - 5 %    Eosinophils Absolute 0.2  0.0 - 0.7 K/uL    Basophils Relative 0  0 - 1 %    Basophils Absolute 0.0  0.0 - 0.1 K/uL     PROTIME-INR     Status: Abnormal   Collection Time   06/29/12  2:15 AM      Component Value Range Comment   Prothrombin Time 50.1 (*) 11.6 - 15.2 seconds    INR 6.12 (*) 0.00 - 1.49   TYPE AND SCREEN     Status: Normal   Collection Time   06/29/12  2:15 AM      Component Value Range Comment   ABO/RH(D) O POS      Antibody Screen NEG      Sample Expiration 07/02/2012     ABO/RH     Status: Normal   Collection Time   06/29/12  2:15 AM      Component Value Range Comment   ABO/RH(D) O POS     CBC     Status: Abnormal   Collection Time   06/29/12 10:25 AM      Component Value Range Comment   WBC 11.8 (*) 4.0 - 10.5 K/uL    RBC 3.35 (*) 3.87 - 5.11 MIL/uL    Hemoglobin 10.7 (*) 12.0 - 15.0 g/dL    HCT 09.8 (*) 11.9 - 46.0 %    MCV 90.4  78.0 - 100.0 fL    MCH 31.9  26.0 - 34.0 pg    MCHC 35.3  30.0 - 36.0 g/dL    RDW 14.7  82.9 - 56.2 %    Platelets 246  150 - 400 K/uL     Dg Chest 1 View  06/29/2012  *RADIOLOGY REPORT*  Clinical Data: Fall.  Right hip pain.  CHEST - 1 VIEW  Comparison: 03/26/2011.  Findings:  Cardiopericardial silhouette within normal limits. Mediastinal contours normal. Trachea midline.  No airspace disease or effusion.  Low volumes are present.  Stable prominence of left a perihilar pulmonary artery.  Multiple loose bodies are present in the left axillary pouch with left right humeral osteoarthritis.  IMPRESSION: Low volume chest.  No active cardiopulmonary disease.   Original Report Authenticated By: Andreas Newport, M.D.    Dg Hip Complete Right  06/29/2012  *RADIOLOGY REPORT*  Clinical Data: Fall.  Right hip pain.  RIGHT HIP - COMPLETE 2+ VIEW  Comparison: None.  Findings: Bilateral SI joint degenerative disease.  Partially visualized lumbar spondylosis.  The obturator rings appear intact. There is abnormal lucency in the right greater trochanter.  The right hip is externally rotated.  There is angulation in the lateral margin of the femoral head. Additionally,  there is irregular lucency through the greater trochanter.   On the frontal view of the pelvis, the fracture is more evident, particularly with step-off deformity along the medial inferior femoral head.  IMPRESSION: Right proximal femur fracture.  The fracture is probably sub capital but may have intertrochanteric extension as well.  Probable mild impaction.  Fracture is suboptimally visualized due to external rotation of the hip.   Original Report Authenticated By: Andreas Newport, M.D.     ROS/Vitals Blood pressure 87/51, pulse 78, temperature 97.5 F (36.4 C), temperature source Oral, resp. rate 16,  height 5' 5.75" (1.67 m), weight 81.647 kg (180 lb), SpO2 98.00%. Physical Exam Patient is an 78 year old white female, well-nourished, overweight, no acute distress. Seen in hospital bed at [facility] Pelvis - Stable on exam Right Lower Extremity - neurovascular intact to right leg.  Motor intact - moving foot and toes well on exam.  Noted to have bandage over right toes (not removed, not examined) due to a torn toe nail that has bled.  Pulses ore +2 for DP an PT.  Pain note on attempted hip flexion and hip roll (internal and external rotation).  Radiographs:  Shows a nondisplaced right subcapital femoral neck fracture.  Assessment/Plan: Right Subcapital Femoral Neck Fracture  Patient to be kept at bedrest for now.  Will wait until INR is down.  The earliest would be tomorrow afternoon but may be over the weekend before being ready for the surgery.  Dr. Lequita Halt will be out of town over the weekend and will speak to one of his partners to continue to follow the patient and more than likely will need to perform the procedure once the patient is cleared and ready from the standpoint of the INR.  PERKINS, ALEXZANDREW 06/29/2012, 11:14 AM   I have seen and examined the patient and agree with the above assessment and findings. She had a mechanical fall  And has sustained a non-displaced right femoral  neck fracture. She only complains of groin pain and on exam has no other apparent injuries other than her right foot laceration. She will require a right hip pinning once her INR is at a reasonably safe level. I will be out of town this weekend but have spoken to one of my partners, Dr. Charlann Boxer, who is on call tomorrow and can perform the surgery tomorrow if the patient is medically ready. I have discussed the surgery in detail with the patient who would like to proceed.

## 2012-06-29 NOTE — Progress Notes (Signed)
Utilization review completed. Sarai January, RN, BSN. 

## 2012-06-29 NOTE — ED Notes (Signed)
Patient requests not to insert foley that she would like to use the bed pan as long as she can.

## 2012-06-29 NOTE — ED Notes (Signed)
Patient declines foley catheter at this time.  EDP Hyacinth Meeker informed and RN Britta Mccreedy informed.  Patient transported to xray at this time

## 2012-06-29 NOTE — H&P (Signed)
PCP:   Lucius Conn., M.D.   Chief Complaint:  Right groin pain after fall  HPI: Madeline Serrano is an 78 year old white female with a history of paroxysmal atrial fibrillation on anticoagulation, moderate COPD, and hypertension who presented to the emergency department with a complaint of right groin pain following a fall. Headaches are seen the patient yesterday in office do to left proximal arm pain, felt to be tendinitis. She was otherwise in her usual state of health. Last evening, she decided to take an extra amount of Valium (about a half of a pill) to help her sleep. She awoke and walked to the bathroom. Upon return to her bed she tripped on the carpet and fell onto her right hip. She developed acute onset right groin pain and was bleeding significantly from her right foot. She called her son and was brought to the emergency department where she was found have a right subcapital femur fracture. In addition, her INR was 6.0 and sodium was 123.  Her last INR was less than a month ago and was 3.0. She's not had any changes in her dose. She has had some decreased by mouth intake recently. In fact, yesterday she states that she only had grapes and a small bowl of soup after breakfast. She's not been drinking a lot of fluid. She continues to take her Maxzide and reports that sometimes she eats salty crackers to try to raise her sodium. Her last sodium value was 131 about 2 months ago. She denies any lightheadedness, dizziness, syncope, or near syncope. She denies any recent chest pain.  No recent bleeding issues. Her pain is controlled after receiving fentanyl and Dilantin in the emergency department  Review of Systems:  Review of Systems - all systems reviewed with patient and are negative as in history of present illness following exceptions: Left arm pain is improved today  Past Medical History: COPD- PFTs in 2/13- moderate (FEV1/FVC 60%, FEV1 1.2L) HTN (renal artery study normal 3/09) history of  Acute renal failure Anemia paroxysmal Atrial fib. on anticoagulation Hyperlipidemia IFG Osteoporosis 7/10 (t-2.6 Left femoral neck)- left foot stress fracture (11/12), remote left humerus fracture Macular degeneration  Past Surgical History: 1984 bladder prolapse 1999 knee arthroscopic 2001 knee arth. 2001 cataract OD 1967 (R) #2, 3 finger grafts  Medications: Prior to Admission medications   Medication Sig Start Date End Date Taking? Authorizing Provider  calcium carbonate (OS-CAL) 600 MG TABS Take 600 mg by mouth 3 (three) times daily with meals.   Yes Historical Provider, MD  carvedilol (COREG) 12.5 MG tablet Take 18.75 mg by mouth 2 (two) times daily with a meal.    Yes Historical Provider, MD  estrogen, conjugated,-medroxyprogesterone (PREMPRO) 0.3-1.5 MG per tablet Take 1 tablet by mouth daily. 5 days a week Does not take on Tuesday and Friday   Yes Historical Provider, MD  fish oil-omega-3 fatty acids 1000 MG capsule Take 1 g by mouth daily.    Yes Historical Provider, MD  flecainide (TAMBOCOR) 50 MG tablet Take 50 mg by mouth 2 (two) times daily.     Yes Historical Provider, MD  lansoprazole (PREVACID) 15 MG capsule Take 15 mg by mouth every other day.   Yes Historical Provider, MD  Multiple Vitamins-Minerals (ICAPS) TABS Take 1 tablet by mouth daily.   Yes Historical Provider, MD  olmesartan (BENICAR) 20 MG tablet Take 20 mg by mouth daily.   Yes Historical Provider, MD  pyridoxine (B-6) 200 MG tablet Take 200 mg  by mouth 2 (two) times daily.    Yes Historical Provider, MD  rosuvastatin (CRESTOR) 10 MG tablet Take 5 mg by mouth every Monday, Wednesday, and Friday.    Yes Historical Provider, MD  tiotropium (SPIRIVA) 18 MCG inhalation capsule Place 18 mcg into inhaler and inhale daily.   Yes Historical Provider, MD  triamterene-hydrochlorothiazide (MAXZIDE) 75-50 MG per tablet Take 0.5 tablets by mouth daily.   Yes Historical Provider, MD  warfarin (COUMADIN) 4 MG tablet Take  2-4 mg by mouth daily. Takes 1 tablet on Monday, and Friday Takes 1/2 tablet the rest of the week   Yes Historical Provider, MD  fexofenadine (ALLEGRA) 180 MG tablet Take 180 mg by mouth daily as needed. For allergies     Historical Provider, MD    Allergies:   Allergies  Allergen Reactions  . Celebrex (Celecoxib) Swelling  . Clarithromycin Swelling  . Statins Other (See Comments)    Hair fell out  . Sulfa Antibiotics Other (See Comments)    unknown  . Vioxx (Rofecoxib) Other (See Comments)    Renal problems  . 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 No Alcohol.  Family History: Father deceased 35: MI, DM2 Mother: deceased 39: COPD +smoker Siblings: none Children: 2 sons- 1 with testicular cancer Family History: (+) early CAD, (+) DM, (-) colon cancer, (-) breast cancer  Physical Exam: Filed Vitals:   06/29/12 0430 06/29/12 0500 06/29/12 0515 06/29/12 0620  BP: 141/48 125/68  87/51  Pulse: 121 83  78  Temp:   97.8 F (36.6 C) 97.5 F (36.4 C)  TempSrc:   Oral   Resp: 20 17  16   SpO2: 100% 100%  98%   General appearance: alert and no distress Head: Normocephalic, without obvious abnormality, atraumatic Eyes: conjunctivae/corneas clear. PERRL, EOM's intact.  Nose: Nares normal. Septum midline. Mucosa normal. No drainage or sinus tenderness. Throat: lips, mucosa, and tongue normal; teeth and gums normal Neck: no adenopathy, no carotid bruit, no JVD and thyroid not enlarged, symmetric, no tenderness/mass/nodules Resp: clear to auscultation bilaterally Cardio: regular rate and rhythm, S1, S2 normal, no murmur, click, rub or gallop GI: soft, non-tender; bowel sounds normal; no masses,  no organomegaly Extremities: Right leg rotated and foreshortened. Pulses intact  Pulses: 2+ and symmetric; right second toenail is loose with significant  bleeding from the nail bed Lymph nodes: Cervical adenopathy: no cervical lymphadenopathy Neurologic: Alert and oriented X 3, normal strength and tone. Normal symmetric reflexes.    Labs on Admission:   [facility] 06/29/12 0215  NA 123*  K 4.1  CL 88*  CO2 20  GLUCOSE 127*  BUN 36*  CREATININE 1.41*  CALCIUM 9.6  MG --  PHOS --    Basename 06/29/12 0215  WBC 13.2*  NEUTROABS 10.4*  HGB 11.6*  HCT 32.5*  MCV 88.8  PLT 278    Lab Results  Component Value Date   INR 6.12* 06/29/2012   INR 2.62* 03/26/2011   INR 2.59* 03/26/2011    Radiological Exams on Admission: Dg Chest 1 View 06/29/2012  *RADIOLOGY REPORT*  Clinical Data: Fall.  Right hip pain.  CHEST - 1 VIEW  Comparison: 03/26/2011.  Findings:  Cardiopericardial silhouette within normal limits. Mediastinal contours normal. Trachea midline.  No airspace disease or effusion.  Low volumes are present.  Stable prominence of left a perihilar pulmonary artery.  Multiple loose bodies are present in  the left axillary pouch with left right humeral osteoarthritis.  IMPRESSION: Low volume chest.  No active cardiopulmonary disease.   Original Report Authenticated By: Andreas Newport, M.D.    Dg Hip Complete Right 06/29/2012  *RADIOLOGY REPORT*  Clinical Data: Fall.  Right hip pain.  RIGHT HIP - COMPLETE 2+ VIEW  Comparison: None.  Findings: Bilateral SI joint degenerative disease.  Partially visualized lumbar spondylosis.  The obturator rings appear intact. There is abnormal lucency in the right greater trochanter.  The right hip is externally rotated.  There is angulation in the lateral margin of the femoral head. Additionally, there is irregular lucency through the greater trochanter.   On the frontal view of the pelvis, the fracture is more evident, particularly with step-off deformity along the medial inferior femoral head.  IMPRESSION: Right proximal femur fracture.  The fracture is probably sub capital but may have intertrochanteric  extension as well.  Probable mild impaction.  Fracture is suboptimally visualized due to external rotation of the hip.   Original Report Authenticated By: Andreas Newport, M.D.    EKG-atrial flutter with variable conduction. No acute ST changes.  Assessment/Plan Principal Problem: 1. *Femur fracture, right- subcapital femur fracture in the setting of a mechanical fall. Orthopedics has been consult it and will plan for surgical repair once INR has normalized.  She is at moderate risk for perioperative complications do to her atrial fibrillation, COPD but should proceed with surgery without any additional preoperative evaluation.  Active Problems: 2. Hyponatremia- likely secondary to Maxzide. Will discontinue and monitor with IV fluid hydration 3. Coagulopathy secondary to Coumadin therapy- we'll give vitamin K 5 mg IV x1 and recheck INR this afternoon. May redosed to achieve INR less than 2 by tomorrow.  Resume full dose anticoagulation when able per orthopedics. Perioperative DVT prophylaxis otherwise per orthopedics.  Monitor hemoglobin closely do to risk of bleeding. 4. Atrial fibrillation- EKG reveals atrial flutter with variable conduction. We'll place on cardiac telemetry to monitor rate. Continue Coreg for rate control. Resume anticoagulation when able. 5. Hypertension- hold Maxide and ARB preoperatively. We'll resume ARB once stable. We'll stay off the diuretic due to hyponatremia. 6. Osteoporosis- I've encouraged her to consider antiresorptive therapy or Prolia the past but she has resisted. We'll repeat bone density scan as an outpatient and consider options once stable. 7. Disposition-anticipate need for skilled nursing rehabilitation and she does live alone.Madeline Serrano 06/29/2012, 6:53 AM

## 2012-06-30 ENCOUNTER — Inpatient Hospital Stay (HOSPITAL_COMMUNITY): Payer: Medicare Other

## 2012-06-30 ENCOUNTER — Encounter (HOSPITAL_COMMUNITY): Payer: Self-pay | Admitting: Anesthesiology

## 2012-06-30 ENCOUNTER — Encounter (HOSPITAL_COMMUNITY)
Admission: EM | Disposition: A | Discharge status: Rehabilitation Facility | Payer: Self-pay | Source: Home / Self Care | Attending: Internal Medicine

## 2012-06-30 ENCOUNTER — Inpatient Hospital Stay (HOSPITAL_COMMUNITY): Payer: Medicare Other | Admitting: Anesthesiology

## 2012-06-30 HISTORY — PX: HIP ARTHROPLASTY: SHX981

## 2012-06-30 LAB — BASIC METABOLIC PANEL
CO2: 22 mEq/L (ref 19–32)
Calcium: 9 mg/dL (ref 8.4–10.5)
Creatinine, Ser: 1.46 mg/dL — ABNORMAL HIGH (ref 0.50–1.10)
GFR calc non Af Amer: 33 mL/min — ABNORMAL LOW (ref >90)
Glucose, Bld: 98 mg/dL (ref 70–99)

## 2012-06-30 LAB — CBC
MCH: 31.2 pg (ref 26.0–34.0)
MCV: 91.4 fL (ref 78.0–100.0)
Platelets: 176 10*3/uL (ref 150–400)
RDW: 13.5 % (ref 11.5–15.5)

## 2012-06-30 SURGERY — HEMIARTHROPLASTY, HIP, DIRECT ANTERIOR APPROACH, FOR FRACTURE
Anesthesia: General | Site: Hip | Laterality: Right | Wound class: Clean

## 2012-06-30 MED ORDER — PROMETHAZINE HCL 25 MG/ML IJ SOLN
6.2500 mg | INTRAMUSCULAR | Status: DC | PRN
  Administered 2012-06-30: 6.25 mg via INTRAVENOUS
  Filled 2012-06-30: qty 1

## 2012-06-30 MED ORDER — ENOXAPARIN SODIUM 40 MG/0.4ML ~~LOC~~ SOLN
40.0000 mg | SUBCUTANEOUS | Status: DC
  Administered 2012-07-01 – 2012-07-02 (×2): 40 mg via SUBCUTANEOUS
  Filled 2012-06-30 (×3): qty 0.4

## 2012-06-30 MED ORDER — GLYCOPYRROLATE 0.2 MG/ML IJ SOLN
INTRAMUSCULAR | Status: DC | PRN
  Administered 2012-06-30: 0.6 mg via INTRAVENOUS

## 2012-06-30 MED ORDER — LIDOCAINE HCL (CARDIAC) 20 MG/ML IV SOLN
INTRAVENOUS | Status: DC | PRN
  Administered 2012-06-30: 80 mg via INTRAVENOUS

## 2012-06-30 MED ORDER — MENTHOL 3 MG MT LOZG: 1.0000 | LOZENGE | OROMUCOSAL | Status: DC | PRN

## 2012-06-30 MED ORDER — SODIUM CHLORIDE 0.9 % IR SOLN
Status: DC | PRN
  Administered 2012-06-30: 1000 mL

## 2012-06-30 MED ORDER — FERROUS SULFATE 325 (65 FE) MG PO TABS
325.0000 mg | ORAL_TABLET | Freq: Three times a day (TID) | ORAL | Status: DC
  Administered 2012-07-01 – 2012-07-05 (×12): 325 mg via ORAL
  Filled 2012-06-30 (×16): qty 1

## 2012-06-30 MED ORDER — ROCURONIUM BROMIDE 100 MG/10ML IV SOLN
INTRAVENOUS | Status: DC | PRN
  Administered 2012-06-30: 35 mg via INTRAVENOUS

## 2012-06-30 MED ORDER — CLINDAMYCIN PHOSPHATE 900 MG/50ML IV SOLN
900.0000 mg | INTRAVENOUS | Status: AC
  Administered 2012-06-30: 900 mg via INTRAVENOUS
  Filled 2012-06-30: qty 50

## 2012-06-30 MED ORDER — ONDANSETRON HCL 4 MG PO TABS: 4.0000 mg | ORAL_TABLET | Freq: Four times a day (QID) | ORAL | Status: DC | PRN

## 2012-06-30 MED ORDER — NEOSTIGMINE METHYLSULFATE 1 MG/ML IJ SOLN
INTRAMUSCULAR | Status: DC | PRN
  Administered 2012-06-30: 4 mg via INTRAVENOUS

## 2012-06-30 MED ORDER — WARFARIN SODIUM 2 MG PO TABS
2.0000 mg | ORAL_TABLET | Freq: Once | ORAL | Status: AC
  Administered 2012-07-01: 2 mg via ORAL
  Filled 2012-06-30: qty 1

## 2012-06-30 MED ORDER — ONDANSETRON HCL 4 MG/2ML IJ SOLN: 4.0000 mg | Freq: Four times a day (QID) | INTRAMUSCULAR | Status: DC | PRN

## 2012-06-30 MED ORDER — OXYCODONE HCL 5 MG/5ML PO SOLN: 5.0000 mg | Freq: Once | ORAL | Status: AC | PRN

## 2012-06-30 MED ORDER — PHENOL 1.4 % MT LIQD: 1.0000 | OROMUCOSAL | Status: DC | PRN

## 2012-06-30 MED ORDER — MEPERIDINE HCL 25 MG/ML IJ SOLN: 6.2500 mg | INTRAMUSCULAR | Status: DC | PRN

## 2012-06-30 MED ORDER — HYDROMORPHONE HCL PF 1 MG/ML IJ SOLN
0.2500 mg | INTRAMUSCULAR | Status: DC | PRN
  Administered 2012-06-30: 0.25 mg via INTRAVENOUS
  Administered 2012-06-30: 0.5 mg via INTRAVENOUS
  Administered 2012-06-30: 0.25 mg via INTRAVENOUS

## 2012-06-30 MED ORDER — FENTANYL CITRATE 0.05 MG/ML IJ SOLN
INTRAMUSCULAR | Status: DC | PRN
  Administered 2012-06-30 (×3): 50 ug via INTRAVENOUS

## 2012-06-30 MED ORDER — DIPHENHYDRAMINE HCL 25 MG PO CAPS: 25.0000 mg | ORAL_CAPSULE | Freq: Four times a day (QID) | ORAL | Status: DC | PRN

## 2012-06-30 MED ORDER — WARFARIN - PHARMACIST DOSING INPATIENT
Freq: Every day | Status: DC
  Administered 2012-07-03: 18:00:00

## 2012-06-30 MED ORDER — ONDANSETRON HCL 4 MG/2ML IJ SOLN
4.0000 mg | Freq: Once | INTRAMUSCULAR | Status: AC | PRN
  Administered 2012-06-30: 4 mg via INTRAVENOUS

## 2012-06-30 MED ORDER — PROPOFOL 10 MG/ML IV BOLUS
INTRAVENOUS | Status: DC | PRN
  Administered 2012-06-30: 140 mg via INTRAVENOUS

## 2012-06-30 MED ORDER — PHENYLEPHRINE HCL 10 MG/ML IJ SOLN
INTRAMUSCULAR | Status: DC | PRN
  Administered 2012-06-30 (×2): 120 ug via INTRAVENOUS
  Administered 2012-06-30: 80 ug via INTRAVENOUS
  Administered 2012-06-30: 120 ug via INTRAVENOUS
  Administered 2012-06-30: 80 ug via INTRAVENOUS
  Administered 2012-06-30: 120 ug via INTRAVENOUS

## 2012-06-30 MED ORDER — METOCLOPRAMIDE HCL 10 MG PO TABS: 5.0000 mg | ORAL_TABLET | Freq: Three times a day (TID) | ORAL | Status: DC | PRN

## 2012-06-30 MED ORDER — LACTATED RINGERS IV SOLN
INTRAVENOUS | Status: DC | PRN
  Administered 2012-06-30 (×2): via INTRAVENOUS

## 2012-06-30 MED ORDER — OXYCODONE HCL 5 MG PO TABS: 5.0000 mg | ORAL_TABLET | Freq: Once | ORAL | Status: AC | PRN

## 2012-06-30 MED ORDER — EPHEDRINE SULFATE 50 MG/ML IJ SOLN
INTRAMUSCULAR | Status: DC | PRN
  Administered 2012-06-30: 10 mg via INTRAVENOUS

## 2012-06-30 MED ORDER — MIDAZOLAM HCL 5 MG/5ML IJ SOLN
INTRAMUSCULAR | Status: DC | PRN
  Administered 2012-06-30: 1 mg via INTRAVENOUS

## 2012-06-30 MED ORDER — HYDROMORPHONE HCL PF 1 MG/ML IJ SOLN
INTRAMUSCULAR | Status: AC
  Filled 2012-06-30: qty 1

## 2012-06-30 MED ORDER — METOCLOPRAMIDE HCL 5 MG/ML IJ SOLN: 5.0000 mg | Freq: Three times a day (TID) | INTRAMUSCULAR | Status: DC | PRN

## 2012-06-30 MED ORDER — ONDANSETRON HCL 4 MG/2ML IJ SOLN
INTRAMUSCULAR | Status: DC | PRN
  Administered 2012-06-30: 4 mg via INTRAVENOUS

## 2012-06-30 SURGICAL SUPPLY — 50 items
BLADE SAW SAG 73X25 THK (BLADE) ×1
BLADE SAW SGTL 73X25 THK (BLADE) ×1 IMPLANT
BRUSH FEMORAL CANAL (MISCELLANEOUS) IMPLANT
CLOTH BEACON ORANGE TIMEOUT ST (SAFETY) ×2 IMPLANT
COVER SURGICAL LIGHT HANDLE (MISCELLANEOUS) ×2 IMPLANT
DERMABOND ADVANCED (GAUZE/BANDAGES/DRESSINGS) ×1
DERMABOND ADVANCED .7 DNX12 (GAUZE/BANDAGES/DRESSINGS) ×1 IMPLANT
DRAPE INCISE IOBAN 85X60 (DRAPES) ×2 IMPLANT
DRAPE ORTHO SPLIT 77X108 STRL (DRAPES) ×2
DRAPE SURG ORHT 6 SPLT 77X108 (DRAPES) ×2 IMPLANT
DRAPE U-SHAPE 47X51 STRL (DRAPES) ×2 IMPLANT
DRSG AQUACEL AG ADV 3.5X10 (GAUZE/BANDAGES/DRESSINGS) ×2 IMPLANT
DRSG MEPILEX BORDER 4X8 (GAUZE/BANDAGES/DRESSINGS) IMPLANT
DURAPREP 26ML APPLICATOR (WOUND CARE) ×2 IMPLANT
ELECT REM PT RETURN 9FT ADLT (ELECTROSURGICAL) ×2
ELECTRODE REM PT RTRN 9FT ADLT (ELECTROSURGICAL) ×1 IMPLANT
EVACUATOR 1/8 PVC DRAIN (DRAIN) IMPLANT
FACESHIELD LNG OPTICON STERILE (SAFETY) ×4 IMPLANT
GLOVE BIOGEL PI IND STRL 7.5 (GLOVE) ×1 IMPLANT
GLOVE BIOGEL PI IND STRL 8 (GLOVE) ×2 IMPLANT
GLOVE BIOGEL PI INDICATOR 7.5 (GLOVE) ×1
GLOVE BIOGEL PI INDICATOR 8 (GLOVE) ×2
GLOVE ORTHO TXT STRL SZ7.5 (GLOVE) ×2 IMPLANT
GLOVE SURG ORTHO 8.0 STRL STRW (GLOVE) ×2 IMPLANT
GOWN STRL NON-REIN LRG LVL3 (GOWN DISPOSABLE) ×2 IMPLANT
HANDPIECE INTERPULSE COAX TIP (DISPOSABLE)
IMMOBILIZER KNEE 22 UNIV (SOFTGOODS) ×2 IMPLANT
KIT BASIN OR (CUSTOM PROCEDURE TRAY) ×2 IMPLANT
KIT ROOM TURNOVER OR (KITS) ×2 IMPLANT
MANIFOLD NEPTUNE II (INSTRUMENTS) ×2 IMPLANT
NS IRRIG 1000ML POUR BTL (IV SOLUTION) ×2 IMPLANT
PACK TOTAL JOINT (CUSTOM PROCEDURE TRAY) ×2 IMPLANT
PAD ARMBOARD 7.5X6 YLW CONV (MISCELLANEOUS) ×4 IMPLANT
PRESSURIZER FEMORAL UNIV (MISCELLANEOUS) IMPLANT
SET HNDPC FAN SPRY TIP SCT (DISPOSABLE) IMPLANT
SPECIMEN JAR MEDIUM (MISCELLANEOUS) IMPLANT
SPONGE LAP 4X18 X RAY DECT (DISPOSABLE) ×4 IMPLANT
STAPLER VISISTAT (STAPLE) ×2 IMPLANT
STAPLER VISISTAT 35W (STAPLE) IMPLANT
STRIP CLOSURE SKIN 1/2X4 (GAUZE/BANDAGES/DRESSINGS) IMPLANT
SUT MNCRL AB 4-0 PS2 18 (SUTURE) ×2 IMPLANT
SUT VIC AB 1 CT1 27 (SUTURE) ×2
SUT VIC AB 1 CT1 27XBRD ANBCTR (SUTURE) ×2 IMPLANT
SUT VIC AB 2-0 CT1 27 (SUTURE) ×2
SUT VIC AB 2-0 CT1 TAPERPNT 27 (SUTURE) ×2 IMPLANT
TOWEL OR 17X24 6PK STRL BLUE (TOWEL DISPOSABLE) ×2 IMPLANT
TOWEL OR 17X26 10 PK STRL BLUE (TOWEL DISPOSABLE) ×2 IMPLANT
TOWER CARTRIDGE SMART MIX (DISPOSABLE) IMPLANT
TRAY FOLEY CATH 14FR (SET/KITS/TRAYS/PACK) ×2 IMPLANT
WATER STERILE IRR 1000ML POUR (IV SOLUTION) ×6 IMPLANT

## 2012-06-30 NOTE — Progress Notes (Signed)
Subjective: Pain is controlled with pain medications and when lying still.  No other complaints.  Objective: Vital signs in last 24 hours: Temp:  [97.6 F (36.4 C)-97.9 F (36.6 C)] 97.6 F (36.4 C) (09/27 0432) Pulse Rate:  [64-76] 64  (09/27 0432) Resp:  [18-20] 18  (09/27 0432) BP: (110-141)/(33-76) 132/42 mmHg (09/27 0636) SpO2:  [96 %-99 %] 96 % (09/27 0432) Weight:  [81.647 kg (180 lb)] 81.647 kg (180 lb) (09/26 0849) Weight change:  Last BM Date: 06/28/12  CBG (last 3)  No results found for this basename: GLUCAP:3 in the last 72 hours  Intake/Output from previous day: 09/26 0701 - 09/27 0700 In: 2370 [P.O.:270; I.V.:2100] Out: -  Intake/Output this shift: Total I/O In: 1200 [I.V.:1200] Out: -   General appearance: alert and no distress Eyes: no scleral icterus Throat: oropharynx moist without erythema Resp: clear to auscultation bilaterally Cardio: regular rate and rhythm GI: soft, non-tender; bowel sounds normal; no masses,  no organomegaly Extremities: no clubbing, cyanosis or edema; pulses 2+; no bleeding from toe   Lab Results:  Basename 06/29/12 1025 06/29/12 0215  NA 123* 123*  K 4.5 4.1  CL 91* 88*  CO2 23 20  GLUCOSE 152* 127*  BUN 40* 36*  CREATININE 1.59* 1.41*  CALCIUM 9.2 9.6  MG -- --  PHOS -- --   No results found for this basename: AST:2,ALT:2,ALKPHOS:2,BILITOT:2,PROT:2,ALBUMIN:2 in the last 72 hours  Basename 06/29/12 1025 06/29/12 0215  WBC 11.8* 13.2*  NEUTROABS -- 10.4*  HGB 10.7* 11.6*  HCT 30.3* 32.5*  MCV 90.4 88.8  PLT 246 278   Lab Results  Component Value Date   INR 1.88* 06/29/2012   INR 6.12* 06/29/2012   INR 2.62* 03/26/2011   No results found for this basename: CKTOTAL:3,CKMB:3,CKMBINDEX:3,TROPONINI:3 in the last 72 hours No results found for this basename: TSH,T4TOTAL,FREET3,T3FREE,THYROIDAB in the last 72 hours No results found for this basename: VITAMINB12:2,FOLATE:2,FERRITIN:2,TIBC:2,IRON:2,RETICCTPCT:2 in the  last 72 hours  Studies/Results: Dg Chest 1 View  06/29/2012  *RADIOLOGY REPORT*  Clinical Data: Fall.  Right hip pain.  CHEST - 1 VIEW  Comparison: 03/26/2011.  Findings:  Cardiopericardial silhouette within normal limits. Mediastinal contours normal. Trachea midline.  No airspace disease or effusion.  Low volumes are present.  Stable prominence of left a perihilar pulmonary artery.  Multiple loose bodies are present in the left axillary pouch with left right humeral osteoarthritis.  IMPRESSION: Low volume chest.  No active cardiopulmonary disease.   Original Report Authenticated By: Andreas Newport, M.D.    Dg Hip Complete Right  06/29/2012  *RADIOLOGY REPORT*  Clinical Data: Fall.  Right hip pain.  RIGHT HIP - COMPLETE 2+ VIEW  Comparison: None.  Findings: Bilateral SI joint degenerative disease.  Partially visualized lumbar spondylosis.  The obturator rings appear intact. There is abnormal lucency in the right greater trochanter.  The right hip is externally rotated.  There is angulation in the lateral margin of the femoral head. Additionally, there is irregular lucency through the greater trochanter.   On the frontal view of the pelvis, the fracture is more evident, particularly with step-off deformity along the medial inferior femoral head.  IMPRESSION: Right proximal femur fracture.  The fracture is probably sub capital but may have intertrochanteric extension as well.  Probable mild impaction.  Fracture is suboptimally visualized due to external rotation of the hip.   Original Report Authenticated By: Andreas Newport, M.D.      Medications: Scheduled:   . calcium carbonate  1  tablet Oral TID WC  . carvedilol  12.5 mg Oral BID WC  . docusate sodium  100 mg Oral BID  . flecainide  50 mg Oral BID  . loratadine  10 mg Oral Daily  . multivitamin-lutein  1 capsule Oral Daily  . pantoprazole  40 mg Oral Daily  . phytonadione (VITAMIN K) IV  5 mg Intravenous Once  . vitamin B-6  200 mg Oral BID  .  tiotropium  18 mcg Inhalation Daily  . white petrolatum      . DISCONTD: sodium chloride   Intravenous STAT  . DISCONTD: calcium carbonate  600 mg Oral TID WC  . DISCONTD: ICAPS  1 tablet Oral Daily  . DISCONTD: pyridoxine  200 mg Oral BID   Continuous:   . sodium chloride 100 mL/hr at 06/29/12 1738    Assessment/Plan: Principal Problem: 1. Right subcapital femoral neck fracture-Ok to proceed with surgery when OK with ortho assuming INR remains below 1.5-2.0.  Post-op DVT prophylaxis per ortho.  SCDs for now.   Active Problems: 2. Hyponatremia- BMET pending.  Her baseline is around 130.  Diuretics held. 3. Coagulopathy- INR trending down after Vitamin K.  Repeat INR pending. 4. Paroxysmal Atrial fibrillation/flutter- continue coreg for rate control.  Resume Coumadin once hemostasis obtained.   5. Osteoporosis- will address as outpatient.  Consider Prolia. 6. Hypertension- BP acceptable.  ARB and diuretic held.  Continue coreg. 7. Disposition- anticipate need for SNF rehab.  Anticipate discharge Monday if no perioperative complications.    LOS: 1 day   SHAW,W DOUGLAS 06/30/2012, 6:51 AM

## 2012-06-30 NOTE — Preoperative (Signed)
Beta Blockers   Reason not to administer Beta Blockers:Not Applicable 

## 2012-06-30 NOTE — Progress Notes (Signed)
Dressing on right toe is dry and intact able to wiggle toes feels sensation less than 3 sec cappillary refill

## 2012-06-30 NOTE — Progress Notes (Signed)
X ray done

## 2012-06-30 NOTE — Op Note (Signed)
NAME:  Madeline Serrano                ACCOUNT NO.:  [id number]   MEDICAL RECORD NO.: [id number]   LOCATION:  1435                         FACILITY:  [facility]   DATE OF BIRTH:  1930-09-26  PHYSICIAN:  Madlyn Frankel. Charlann Boxer, M.D.     DATE OF PROCEDURE:  06/30/2012                               OPERATIVE REPORT     PREOPERATIVE DIAGNOSIS:  right displaced comminuted femoral neck fracture.   POSTOPERATIVE DIAGNOSIS:  right displaced comminuted femoral neck fracture.   PROCEDURE:  Right hip hemiarthroplasty utilizing DePuy component, size 7 standard Tri-Lock stem with a 49 unipolar ball with a +5 adapter.   SURGEON:  Madlyn Frankel. Charlann Boxer, MD   ASSISTANT:  Lanney Gins, PA-C.   ANESTHESIA:  General.   SPECIMENS:  None.   DRAINS:  One medium Hemovac.   BLOOD LOSS:  About 150 cc.   COMPLICATIONS:  None.   INDICATION OF PROCEDURE:  Madeline Serrano is a 78 year old female who lives independently.  She unfortunately had a fall at her house.  She was admitted to the hospital after radiographs revealed a femoral neck fracture.  She was seen and evaluated and was scheduled for surgery for fixation.  The necessity of surgical repair was discussed with she and her family.  Consent was obtained after reviewing risks of infection, DVT, component failure, and need for revision surgery.  She presented with an INR of >6 and after reversing it to a level of 1.88 it was acceptable to proceed to the OR.   PROCEDURE IN DETAIL:  The patient was brought to the operative theater. Once adequate anesthesia, preoperative antibiotics, 900mg  of Clindamycin administered, the patient was positioned into the left lateral decubitus position with the right side up.  The right lower extremity was then prepped and draped in sterile fashion.  A time-out was performed identifying the patient, planned procedure, and extremity.   A lateral incision was made off the proximal trochanter. Sharp dissection was carried down to the  iliotibial band and gluteal fascia. The gluteal fascia was then incised for posterior approach.  The short external rotators were taken down separate from the posterior capsule. An L capsulotomy was made preserving the posterior leaflet for later anatomic repair. Fracture site was identified and after removing comminuted segments of the posterior femoral neck, the femoral head was removed without difficulty and measured on the back table  using the sizing rings and determined to be 49 mm in diameter.   The proximal femur was then exposed.  Retractors placed.  I then drilled, opened the proximal femur.  Then I hand reamed once and  Irrigated the canal to try to prevent fat emboli.  I began broaching the femur with a 0 broach up to a size 7 broach with good medial and lateral metaphyseal fit without evidence of any torsion or movement despite the comminution particularly over the posterior neck.  A trial reduction was carried out with a standard neck and a +5 adapter with a 49 ball.  The hip reduced nicely.  The leg lengths appeared to be equal compared to the down leg.   The hip went through a range of motion without evidence  of any subluxation or impingement.   Given these findings, the trial components removed.  The final 7 standard  Tri-Lock stem was opened.  After irrigating the canal, the final stem was impacted and sat at the level where the broach was. Based on this and the trial reduction, a +5 adapter was opened and impacted in the 49mm unipolar ball onto a clean and dry trunnion.  The hip had been irrigated throughout the case and again at this point.  I re- Approximated the posterior capsule to the superior leaflet using a  #1 Vicryl,  and placed a medium Hemovac drain deep.  The remainder of the wound was closed with #1 Vicryl in the iliotibial band and gluteal fascia, a  2-0 Vicryl in the sub-Q tissue and a running 4-0 Monocryl in the skin.  The hip was cleaned, dried, and  dressed sterilely using Dermabond and Aquacel dressing.  Drain site was dressed separately.  She was then brought to recovery room, extubated in stable condition, tolerating the procedure well.  Lanney Gins, PA-C was present and utilized as Geophysicist/field seismologist for the entire case from  Preoperative positioning to management of the contralateral extremity and retractors to  General facilitation of the procedure.  He was also involved with primary wound closure.         Madlyn Frankel Charlann Boxer, M.D.

## 2012-06-30 NOTE — Anesthesia Postprocedure Evaluation (Signed)
Anesthesia Post Note  Patient: Madeline Serrano  Procedure(s) Performed: Procedure(s) (LRB): ARTHROPLASTY BIPOLAR HIP (Right)  Anesthesia type: General  Patient location: PACU  Post pain: Pain level controlled and Adequate analgesia  Post assessment: Post-op Vital signs reviewed, Patient's Cardiovascular Status Stable, Respiratory Function Stable, Patent Airway and Pain level controlled  Last Vitals:  Filed Vitals:   06/30/12 1942  BP:   Pulse: 62  Temp: 36.5 C  Resp: 16    Post vital signs: Reviewed and stable  Level of consciousness: awake, alert  and oriented  Complications: No apparent anesthesia complications

## 2012-06-30 NOTE — Progress Notes (Signed)
Dressing noted on right big toe dressing is dry and intact

## 2012-06-30 NOTE — Anesthesia Procedure Notes (Signed)
Procedure Name: Intubation Date/Time: 06/30/2012 6:08 PM Performed by: Sharlene Dory E Pre-anesthesia Checklist: Patient identified, Emergency Drugs available, Suction available, Patient being monitored and Timeout performed Patient Re-evaluated:Patient Re-evaluated prior to inductionOxygen Delivery Method: Circle system utilized Preoxygenation: Pre-oxygenation with 100% oxygen Intubation Type: IV induction Ventilation: Mask ventilation without difficulty Laryngoscope Size: Mac and 3 Grade View: Grade I Tube type: Oral Tube size: 7.5 mm Number of attempts: 1 Airway Equipment and Method: Stylet Placement Confirmation: ETT inserted through vocal cords under direct vision,  positive ETCO2 and breath sounds checked- equal and bilateral Secured at: 21 cm Tube secured with: Tape Dental Injury: Teeth and Oropharynx as per pre-operative assessment

## 2012-06-30 NOTE — Progress Notes (Signed)
Patient ID: Madeline Serrano, female   DOB: [birth year], 78 y.o.   MRN: [id number]  Right femoral neck fracture  Relatively comfortable but ready to have her hip addressed  INR 1.88  To OR today for right hip hemiarthroplasty Discussed with patient and her son who is her power of attorney options, I feel that hemiarthroplasty would give best chance to prevent needing another operation and allow her to get up and moving the quickest and most reliably.  They are in agreement, consent order changed  Thanks

## 2012-06-30 NOTE — Anesthesia Preprocedure Evaluation (Signed)
Anesthesia Evaluation  Patient identified by MRN, date of birth, ID band Patient awake    Reviewed: Allergy & Precautions, H&P , NPO status , Patient's Chart, lab work & pertinent test results  History of Anesthesia Complications (+) PONV  Airway Mallampati: II  Neck ROM: Full    Dental  (+) Partial Lower, Partial Upper, Poor Dentition and Dental Advisory Given   Pulmonary COPD breath sounds clear to auscultation        Cardiovascular hypertension, + dysrhythmias Atrial Fibrillation Rhythm:Regular Rate:Normal     Neuro/Psych negative neurological ROS     GI/Hepatic negative GI ROS, Neg liver ROS,   Endo/Other  negative endocrine ROS  Renal/GU negative Renal ROS     Musculoskeletal   Abdominal   Peds  Hematology Anemia Hct 27   Anesthesia Other Findings   Reproductive/Obstetrics                           Anesthesia Physical Anesthesia Plan  ASA: II  Anesthesia Plan: General   Post-op Pain Management:    Induction: Intravenous  Airway Management Planned: Oral ETT  Additional Equipment:   Intra-op Plan:   Post-operative Plan: Extubation in OR  Informed Consent: I have reviewed the patients History and Physical, chart, labs and discussed the procedure including the risks, benefits and alternatives for the proposed anesthesia with the patient or authorized representative who has indicated his/her understanding and acceptance.     Plan Discussed with: CRNA and Surgeon  Anesthesia Plan Comments:         Anesthesia Quick Evaluation

## 2012-06-30 NOTE — Progress Notes (Signed)
SNF referral received for patient.  Awaiting PT evaluation to determine if SNF is appropriate.  CSW will follow up. [name]. [name]  [phone]

## 2012-06-30 NOTE — Progress Notes (Signed)
ANTICOAGULATION CONSULT NOTE - Initial Consult  Pharmacy Consult for Coumadin Indication: VTE prophylaxis  Allergies  Allergen Reactions  . Celebrex (Celecoxib) Swelling  . Clarithromycin Swelling  . Statins Other (See Comments)    Hair fell out  . Sulfa Antibiotics Other (See Comments)    unknown  . Vioxx (Rofecoxib) Other (See Comments)    Renal problems  . Penicillins Rash    Patient Measurements: Height: 5' 5.75" (167 cm) Weight: 180 lb (81.647 kg) IBW/kg (Calculated) : 58.72   Vital Signs: Temp: 97.5 F (36.4 C) (09/27 2147) BP: 111/45 mmHg (09/27 2147) Pulse Rate: 62  (09/27 2147)  Labs:  Basename 06/30/12 0832 06/30/12 0610 06/29/12 1858 06/29/12 1025 06/29/12 0215  HGB -- 9.4* -- 10.7* --  HCT -- 27.5* -- 30.3* 32.5*  PLT -- 176 -- 246 278  APTT -- -- -- -- --  LABPROT 18.1* -- 20.9* -- 50.1*  INR 1.55* -- 1.88* -- 6.12*  HEPARINUNFRC -- -- -- -- --  CREATININE -- 1.46* -- 1.59* 1.41*  CKTOTAL -- -- -- -- --  CKMB -- -- -- -- --  TROPONINI -- -- -- -- --    Estimated Creatinine Clearance: 32.9 ml/min (by C-G formula based on Cr of 1.46).   Medical History: Past Medical History  Diagnosis Date  . A-fib   . Hypertension   . High cholesterol   . Macular degeneration     Medications:  Prescriptions prior to admission  Medication Sig Dispense Refill  . calcium carbonate (OS-CAL) 600 MG TABS Take 600 mg by mouth 3 (three) times daily with meals.      . carvedilol (COREG) 12.5 MG tablet Take 18.75 mg by mouth 2 (two) times daily with a meal.       . estrogen, conjugated,-medroxyprogesterone (PREMPRO) 0.3-1.5 MG per tablet Take 1 tablet by mouth daily. 5 days a week Does not take on Tuesday and Friday      . fish oil-omega-3 fatty acids 1000 MG capsule Take 1 g by mouth daily.       . flecainide (TAMBOCOR) 50 MG tablet Take 50 mg by mouth 2 (two) times daily.        . lansoprazole (PREVACID) 15 MG capsule Take 15 mg by mouth every other day.      .  Multiple Vitamins-Minerals (ICAPS) TABS Take 1 tablet by mouth daily.      Marland Kitchen olmesartan (BENICAR) 20 MG tablet Take 20 mg by mouth daily.      Marland Kitchen pyridoxine (B-6) 200 MG tablet Take 200 mg by mouth 2 (two) times daily.       . rosuvastatin (CRESTOR) 10 MG tablet Take 5 mg by mouth every Monday, Wednesday, and Friday.       . tiotropium (SPIRIVA) 18 MCG inhalation capsule Place 18 mcg into inhaler and inhale daily.      Marland Kitchen triamterene-hydrochlorothiazide (MAXZIDE) 75-50 MG per tablet Take 0.5 tablets by mouth daily.      Marland Kitchen warfarin (COUMADIN) 4 MG tablet Take 2-4 mg by mouth daily. Takes 1 tablet on Monday, and Friday Takes 1/2 tablet the rest of the week      . fexofenadine (ALLEGRA) 180 MG tablet Take 180 mg by mouth daily as needed. For allergies         Assessment: Madeline Serrano was admitted for femoral neck fracture s/p fall, she is now s/p R hip arthroplasty. She was on Coumadin PTA for afib, INR was > 6 at admission, and she  is s/p Vit K (5mg  IV) reversal on 9/26. INR today is subtherapeutic. H/H and plts have decreased since admit, without overt bleeding. Noted home Coumadin regimen was 4 MG M, F; 2mg  TWThSatSun.  Goal of Therapy:  INR 2-3 Monitor platelets by anticoagulation protocol: Yes   Plan:  - Coumadin 2mg  PO x 1 today - Will f/up daily PT/INR  Thanks, Milon Dethloff K. Allena Katz, PharmD, BCPS.  Clinical Pharmacist Pager [phone]. 06/30/2012 10:50 PM

## 2012-06-30 NOTE — Transfer of Care (Signed)
Immediate Anesthesia Transfer of Care Note  Patient: Madeline Serrano  Procedure(s) Performed: Procedure(s) (LRB) with comments: ARTHROPLASTY BIPOLAR HIP (Right) - right hip hemiarthroplasty  Patient Location: PACU  Anesthesia Type: General  Level of Consciousness: awake, alert  and oriented  Airway & Oxygen Therapy: Patient Spontanous Breathing and Patient connected to face mask oxygen  Post-op Assessment: Report given to PACU RN, Post -op Vital signs reviewed and stable and Patient moving all extremities  Post vital signs: Reviewed and stable  Complications: No apparent anesthesia complications

## 2012-07-01 LAB — CBC
HCT: 24.7 % — ABNORMAL LOW (ref 36.0–46.0)
MCH: 32.8 pg (ref 26.0–34.0)
MCHC: 35.2 g/dL (ref 30.0–36.0)
MCV: 93.2 fL (ref 78.0–100.0)
RDW: 13.4 % (ref 11.5–15.5)
WBC: 8.5 10*3/uL (ref 4.0–10.5)

## 2012-07-01 LAB — BASIC METABOLIC PANEL
BUN: 24 mg/dL — ABNORMAL HIGH (ref 6–23)
CO2: 21 mEq/L (ref 19–32)
Chloride: 99 mEq/L (ref 96–112)
Creatinine, Ser: 1.23 mg/dL — ABNORMAL HIGH (ref 0.50–1.10)
Glucose, Bld: 93 mg/dL (ref 70–99)

## 2012-07-01 MED ORDER — CLINDAMYCIN PHOSPHATE 600 MG/50ML IV SOLN
600.0000 mg | Freq: Four times a day (QID) | INTRAVENOUS | Status: AC
  Administered 2012-07-01 (×2): 600 mg via INTRAVENOUS
  Filled 2012-07-01 (×2): qty 50

## 2012-07-01 MED ORDER — WARFARIN SODIUM 2 MG PO TABS
2.0000 mg | ORAL_TABLET | Freq: Once | ORAL | Status: AC
  Administered 2012-07-01: 2 mg via ORAL
  Filled 2012-07-01 (×2): qty 1

## 2012-07-01 NOTE — Progress Notes (Signed)
Subjective: No complaints today Objective: Vital signs in last 24 hours: Temp:  [97.4 F (36.3 C)-98.3 F (36.8 C)] 98 F (36.7 C) (09/28 0559) Pulse Rate:  [59-65] 63  (09/28 0559) Resp:  [14-21] 20  (09/28 0800) BP: (111-163)/(39-56) 118/43 mmHg (09/28 0559) SpO2:  [98 %-100 %] 98 % (09/28 1007) Weight change:  Last BM Date: 06/28/12  CBG (last 3)  No results found for this basename: GLUCAP:3 in the last 72 hours  Intake/Output from previous day: 09/27 0701 - 09/28 0700 In: 2700 [I.V.:2700] Out: 1775 [Urine:1625; Blood:150] Intake/Output this shift:    General appearance: alert and no distress Eyes: no scleral icterus Throat: oropharynx moist without erythema Resp: clear to auscultation bilaterally Cardio: regular rate and rhythm GI: soft, non-tender; bowel sounds normal; no masses,  no organomegaly Extremities: no clubbing, cyanosis or edema; pulses 2+; no bleeding from toe   Lab Results:  Basename 07/01/12 0502 06/30/12 0610  NA 129* 127*  K 4.1 4.1  CL 99 96  CO2 21 22  GLUCOSE 93 98  BUN 24* 36*  CREATININE 1.23* 1.46*  CALCIUM 8.4 9.0  MG -- --  PHOS -- --   No results found for this basename: AST:2,ALT:2,ALKPHOS:2,BILITOT:2,PROT:2,ALBUMIN:2 in the last 72 hours  Basename 07/01/12 0502 06/30/12 0610 06/29/12 0215  WBC 8.5 7.4 --  NEUTROABS -- -- 10.4*  HGB 8.7* 9.4* --  HCT 24.7* 27.5* --  MCV 93.2 91.4 --  PLT 195 176 --   Lab Results  Component Value Date   INR 1.47 07/01/2012   INR 1.55* 06/30/2012   INR 1.88* 06/29/2012   No results found for this basename: CKTOTAL:3,CKMB:3,CKMBINDEX:3,TROPONINI:3 in the last 72 hours No results found for this basename: TSH,T4TOTAL,FREET3,T3FREE,THYROIDAB in the last 72 hours No results found for this basename: VITAMINB12:2,FOLATE:2,FERRITIN:2,TIBC:2,IRON:2,RETICCTPCT:2 in the last 72 hours  Studies/Results: Dg Pelvis Portable  06/30/2012  *RADIOLOGY REPORT*  Clinical Data: Right hip replacement  PORTABLE  PELVIS  Comparison: CT 09/22/2006 the right  Findings: Right hip arthroplasty is intact without evidence of dislocation or fracture.  There is subcutaneous gas along the left flank consistent recent surgery.  IMPRESSION: Note complication following right hip arthroplasty.   Original Report Authenticated By: Genevive Bi, M.D.      Medications: Scheduled:    . calcium carbonate  1 tablet Oral TID WC  . carvedilol  12.5 mg Oral BID WC  . clindamycin (CLEOCIN) IV  600 mg Intravenous Q6H  . clindamycin (CLEOCIN) IV  900 mg Intravenous To OR  . docusate sodium  100 mg Oral BID  . enoxaparin (LOVENOX) injection  40 mg Subcutaneous Q24H  . ferrous sulfate  325 mg Oral TID PC  . flecainide  50 mg Oral BID  . HYDROmorphone      . loratadine  10 mg Oral Daily  . multivitamin-lutein  1 capsule Oral Daily  . pantoprazole  40 mg Oral Daily  . vitamin B-6  200 mg Oral BID  . tiotropium  18 mcg Inhalation Daily  . warfarin  2 mg Oral ONCE-1800  . Warfarin - Pharmacist Dosing Inpatient   Does not apply q1800   Continuous:    . sodium chloride 20 mL/hr at 06/30/12 1700    Assessment/Plan: Principal Problem: 1. Right subcapital femoral neck fracture-s/p arthroplasty of Bipolar hip.  Pt will need inpatient rehab/SNF.  Active Problems: 2. Hyponatremia- Na improving slowly. Cont to monitor. Her baseline is around 130.  Diuretics held. 3. Coagulopathy- being followed by pharmacy protocol 4.  Paroxysmal Atrial fibrillation/flutter- continue coreg and tikosyn for rate and rhythm control.  Restarted Coumadin.   5. Osteoporosis- will address as outpatient.  Consider Prolia. 6. Hypertension- BP acceptable.  ARB and diuretic held.  Continue coreg. 7. Disposition- anticipate need for SNF rehab.  Anticipate discharge Monday if no perioperative complications.    LOS: 2 days   Chevy Sweigert 07/01/2012, 12:19 PM

## 2012-07-01 NOTE — Progress Notes (Signed)
Pt. W/ decreased U.O. - foley DC'd this am. Bladder scan shows less than 150cc x 2 separate readings todaY.  Reported to IM- DR [facility] and to con't to monitor, encourage po fluids and IVF as presently doing.

## 2012-07-01 NOTE — Evaluation (Signed)
Physical Therapy Evaluation Patient Details Name: Madeline Serrano MRN: [id number] DOB: 12-13-1929 Today's Date: 07/01/2012 Time: 1478-2956 PT Time Calculation (min): 33 min  PT Assessment / Plan / Recommendation Clinical Impression  Pt is a cooperative 78 y.o. female s/p fall fractured femur with surgical intervention of right hemi arthroplasty.  Patient demonstrates deficits in functional mobility and activity tolerance at this time secondary to pain, weakness, co morbidities, and WBing limitations.  Feel patient will benefit from continued skilled physical therapy.  Will continue to see acutely to address deficits. Patient is good candidate for Continued inpatient rehabilitation. During session patient displayed some diaphoresis and reported dizziness with ambulation. Vitals assess 104/51 seated Heart rate 96 BPM.  Patient rested for >3 mins and symptoms resolved.  Nsg aware.     PT Assessment  Patient needs continued PT services    Follow Up Recommendations  Inpatient Rehab;Skilled nursing facility    Barriers to Discharge Inaccessible home environment;Decreased caregiver support steps to enter and lives alone    Equipment Recommendations  Rolling walker with 5" wheels;3 in 1 bedside comode;Tub/shower bench    Recommendations for Other Services Rehab consult   Frequency Min 5X/week    Precautions / Restrictions Precautions Precautions: Posterior Hip Restrictions Weight Bearing Restrictions: Yes RLE Weight Bearing: Partial weight bearing RLE Partial Weight Bearing Percentage or Pounds: 50   Pertinent Vitals/Pain 5/10 aching pain      Mobility  Bed Mobility Bed Mobility: Supine to Sit;Sitting - Scoot to Edge of Bed Supine to Sit: 4: Min assist Sitting - Scoot to [place] of Bed: 4: Min assist Details for Bed Mobility Assistance: Min assist to sit upright and bring RLE fwd.  VCs for body position; technique and hand placement Transfers Transfers: Sit to Stand;Stand to Sit Sit to  Stand: 4: Min guard Stand to Sit: 4: Min guard;4: Min assist Details for Transfer Assistance: Min assist to sit secondary to symptomatic dizziness; guard and VCs for hand placement Ambulation/Gait Ambulation/Gait Assistance: 4: Min guard;4: Min Environmental consultant (Feet): 8 Feet Assistive device: Rolling walker Ambulation/Gait Assistance Details: Min Guard initially, then min assist to perform turn and last few steps secondary to dizziness Gait Pattern: Step-to pattern (partial wt bearing) Gait velocity: decreased General Gait Details: Difficulty following VCs and maintaining partial WBing Stairs: No    Shoulder Instructions     Exercises Total Joint Exercises Ankle Circles/Pumps: AROM;Both;20 reps Quad Sets: Strengthening;5 reps;Supine (performed x2 and continuously once seated in chair)   PT Diagnosis: Difficulty walking;Abnormality of gait;Generalized weakness;Acute pain  PT Problem List: Decreased strength;Decreased activity tolerance;Decreased mobility PT Treatment Interventions: DME instruction;Gait training;Stair training;Functional mobility training;Therapeutic activities;Therapeutic exercise;Patient/family education   PT Goals Acute Rehab PT Goals PT Goal Formulation: With patient Time For Goal Achievement: 07/04/12 Potential to Achieve Goals: Good Pt will Stand: with modified independence PT Goal: Stand - Progress: Goal set today Pt will Ambulate: >150 feet;with modified independence;with rolling walker PT Goal: Ambulate - Progress: Goal set today Pt will Go Up / Down Stairs: 1-2 stairs;with modified independence;with rolling walker PT Goal: Up/Down Stairs - Progress: Goal set today  Visit Information  Last PT Received On: 07/01/12    Subjective Data  Subjective: I'm nervous to get up Patient Stated Goal: To go to rehab then eventually go home   Prior Functioning  Home Living Lives With: Alone Available Help at Discharge: Family;Available  PRN/intermittently Type of Home: House Home Access: Stairs to enter [employer] of Steps: 1 (3 steps in front with rails)  Entrance Stairs-Rails: None Home Layout: Two level;Able to live on main level with bedroom/bathroom Bathroom Shower/Tub: Walk-in shower;Door Dentist: None Prior Function Level of Independence: Independent Able to Take Stairs?: Yes Driving: Yes Vocation: Retired Musician: H[employer] Dominant Hand: Right    Cognition  Overall Cognitive Status: Appears within functional limits for tasks assessed/performed Arousal/Alertness: Awake/alert Orientation Level: Appears intact for tasks assessed;Oriented X4 / Intact Behavior During Session: [facility] for tasks performed    Extremity/Trunk Assessment Right Upper Extremity Assessment RUE ROM/Strength/Tone: [facility] for tasks assessed (amputated digits right hand) Left Upper Extremity Assessment LUE ROM/Strength/Tone: Deficits;Due to pain (bursitis ) Right Lower Extremity Assessment RLE ROM/Strength/Tone: Deficits;Unable to fully assess;Due to pain;Due to precautions Left Lower Extremity Assessment LLE ROM/Strength/Tone: [facility] for tasks assessed   Balance    End of Session PT - End of Session Equipment Utilized During Treatment: Gait belt;Right knee immobilizer Activity Tolerance: Patient limited by fatigue;Patient limited by pain;Other (comment) (patient became dizzy with ambulation Vitals assessed low BP) Patient left: in chair;with call bell/phone within reach (reclined ) Nurse Communication: Mobility status  GP     Fabio Asa 07/01/2012, 9:53 AM  Charlotte Crumb, PT DPT  [phone]

## 2012-07-01 NOTE — Progress Notes (Signed)
Orthopedic Tech Progress Note Patient Details:  Madeline Serrano Feb 06, 1930 [id number]  Patient ID: Madeline Serrano, female   DOB: April 30, 1930, 78 y.o.   MRN: [id number] Confirmed pt has OHF. Saw no note on acct stating who delivered OHF.    Nathan Moctezuma T 07/01/2012, 11:03 AM

## 2012-07-01 NOTE — Progress Notes (Signed)
Subjective: 1 Day Post-Op Procedure(s) (LRB): ARTHROPLASTY BIPOLAR HIP (Right) Patient reports pain as 3 on 0-10 scale.    Objective: Vital signs in last 24 hours: Temp:  [97.4 F (36.3 C)-98.3 F (36.8 C)] 98 F (36.7 C) (09/28 0559) Pulse Rate:  [59-65] 63  (09/28 0559) Resp:  [14-21] 20  (09/28 0800) BP: (111-163)/(39-56) 118/43 mmHg (09/28 0559) SpO2:  [98 %-100 %] 98 % (09/28 1007)  Intake/Output from previous day: 09/27 0701 - 09/28 0700 In: 2700 [I.V.:2700] Out: 1775 [Urine:1625; Blood:150] Intake/Output this shift:     Basename 07/01/12 0502 06/30/12 0610 06/29/12 1025 06/29/12 0215  HGB 8.7* 9.4* 10.7* 11.6*    Basename 07/01/12 0502 06/30/12 0610  WBC 8.5 7.4  RBC 2.65* 3.01*  HCT 24.7* 27.5*  PLT 195 176    Basename 07/01/12 0502 06/30/12 0610  NA 129* 127*  K 4.1 4.1  CL 99 96  CO2 21 22  BUN 24* 36*  CREATININE 1.23* 1.46*  GLUCOSE 93 98  CALCIUM 8.4 9.0    Basename 07/01/12 0502 06/30/12 0832  LABPT -- --  INR 1.47 1.55*   Dressing clean, dry, intact. No change neurovascular status    Assessment/Plan: 1 Day Post-Op Procedure(s) (LRB): ARTHROPLASTY BIPOLAR HIP (Right) Up with therapy D/C planning Medicine following  Madeline Serrano A 07/01/2012, 11:42 AM

## 2012-07-01 NOTE — Progress Notes (Signed)
ANTICOAGULATION CONSULT NOTE - Follow Up Consult  Pharmacy Consult for Coumadin Indication: VTE prophylaxis/Afib  Allergies  Allergen Reactions  . Celebrex (Celecoxib) Swelling  . Clarithromycin Swelling  . Statins Other (See Comments)    Hair fell out  . Sulfa Antibiotics Other (See Comments)    unknown  . Vioxx (Rofecoxib) Other (See Comments)    Renal problems  . Penicillins Rash    Patient Measurements: Height: 5' 5.75" (167 cm) Weight: 180 lb (81.647 kg) IBW/kg (Calculated) : 58.72    Vital Signs: Temp: 98 F (36.7 C) (09/28 0559) BP: 118/43 mmHg (09/28 0559) Pulse Rate: 63  (09/28 0559)  Labs:  Basename 07/01/12 0502 06/30/12 0832 06/30/12 0610 06/29/12 1858 06/29/12 1025  HGB 8.7* -- 9.4* -- --  HCT 24.7* -- 27.5* -- 30.3*  PLT 195 -- 176 -- 246  APTT -- -- -- -- --  LABPROT 17.4* 18.1* -- 20.9* --  INR 1.47 1.55* -- 1.88* --  HEPARINUNFRC -- -- -- -- --  CREATININE 1.23* -- 1.46* -- 1.59*  CKTOTAL -- -- -- -- --  CKMB -- -- -- -- --  TROPONINI -- -- -- -- --    Estimated Creatinine Clearance: 39.1 ml/min (by C-G formula based on Cr of 1.23).    Assessment:  Madeline Serrano was admitted for femoral neck fracture s/p fall, she is now s/p R hip arthroplasty. She was on Coumadin PTA for afib, INR was > 6 at admission, and she is s/p Vit K (5mg  IV) reversal on 9/26. INR today is subtherapeutic at 1.47, which was drawn only 5hrs after 1st dose of Coumadin since restarting. Currently receiving Lovenox bridge. H/H and plts have decreased since admit, without overt bleeding. Noted home Coumadin regimen was 4 MG M, F; 2mg  TWThSatSun.   Goal of Therapy:  INR 2-3  Monitor platelets by anticoagulation protocol: Yes   Plan:  - Coumadin 2mg  PO x 1 today  - Will f/up daily PT/INR    Thanks, Vania Rea. Darin Engels.D. Clinical Pharmacist Pager [phone] Phone [phone] 07/01/2012 1:27 PM

## 2012-07-02 LAB — BASIC METABOLIC PANEL
BUN: 24 mg/dL — ABNORMAL HIGH (ref 6–23)
CO2: 17 mEq/L — ABNORMAL LOW (ref 19–32)
Calcium: 8.4 mg/dL (ref 8.4–10.5)
Chloride: 98 mEq/L (ref 96–112)
Creatinine, Ser: 1.51 mg/dL — ABNORMAL HIGH (ref 0.50–1.10)
GFR calc Af Amer: 36 mL/min — ABNORMAL LOW (ref >90)
GFR calc non Af Amer: 31 mL/min — ABNORMAL LOW (ref >90)
Glucose, Bld: 121 mg/dL — ABNORMAL HIGH (ref 70–99)
Potassium: 4.5 mEq/L (ref 3.5–5.1)
Sodium: 127 mEq/L — ABNORMAL LOW (ref 135–145)

## 2012-07-02 LAB — CBC
Hemoglobin: 7.8 g/dL — ABNORMAL LOW (ref 12.0–15.0)
MCHC: 34.2 g/dL (ref 30.0–36.0)
Platelets: 246 10*3/uL (ref 150–400)
RDW: 13.7 % (ref 11.5–15.5)

## 2012-07-02 LAB — PROTIME-INR
INR: 1.92 — ABNORMAL HIGH (ref 0.00–1.49)
Prothrombin Time: 21.2 seconds — ABNORMAL HIGH (ref 11.6–15.2)

## 2012-07-02 MED ORDER — ETHACRYNIC ACID 25 MG PO TABS
25.0000 mg | ORAL_TABLET | Freq: Once | ORAL | Status: AC
  Administered 2012-07-02: 25 mg via ORAL
  Filled 2012-07-02: qty 1

## 2012-07-02 MED ORDER — DIPHENHYDRAMINE HCL 25 MG PO CAPS
25.0000 mg | ORAL_CAPSULE | Freq: Once | ORAL | Status: AC
  Administered 2012-07-02: 25 mg via ORAL
  Filled 2012-07-02: qty 1

## 2012-07-02 MED ORDER — FUROSEMIDE 10 MG/ML IJ SOLN: 20.0000 mg | Freq: Once | INTRAMUSCULAR | Status: DC

## 2012-07-02 MED ORDER — ETHACRYNIC ACID 25 MG PO TABS
25.0000 mg | ORAL_TABLET | Freq: Once | ORAL | Status: DC
  Filled 2012-07-02: qty 1

## 2012-07-02 MED ORDER — WARFARIN SODIUM 1 MG PO TABS
1.0000 mg | ORAL_TABLET | Freq: Once | ORAL | Status: AC
  Administered 2012-07-02: 1 mg via ORAL
  Filled 2012-07-02: qty 1

## 2012-07-02 MED ORDER — ACETAMINOPHEN 325 MG PO TABS
650.0000 mg | ORAL_TABLET | Freq: Once | ORAL | Status: AC
  Administered 2012-07-02: 650 mg via ORAL
  Filled 2012-07-02: qty 2

## 2012-07-02 NOTE — Progress Notes (Signed)
Clinical Social Work Department CLINICAL SOCIAL WORK PLACEMENT NOTE 07/02/2012  Patient:  Madeline Serrano, Madeline Serrano  Account Number:  [id number] Admit date:  06/29/2012  Clinical Social Worker:  Dellie Burns, Theresia Majors  Date/time:  07/02/2012 12:00 N  Clinical Social Work is seeking post-discharge placement for this patient at the following level of care:   SKILLED NURSING   (*CSW will update this form in Epic as items are completed)   (JB)07/02/2012  Patient/family provided with [facility] Department of Clinical Social Work's list of facilities offering this level of care within the geographic area requested by the patient (or if unable, by the patient's family).  (JB)07/02/2012  Patient/family informed of their freedom to choose among providers that offer the needed level of care, that participate in Medicare, Medicaid or managed care program needed by the patient, have an available bed and are willing to accept the patient.  (JB)07/02/2012  Patient/family informed of [facility]' ownership interest in [facility], as well as of the fact that they are under no obligation to receive care at this facility.  PASARR submitted to [employer] on 07/02/2012(JB) PASARR number received from [employer] on 07/02/2012(JB)  FL2 transmitted to all facilities in geographic area requested by pt/family on  07/02/2012(JB) FL2 transmitted to all facilities within larger geographic area on   Patient informed that his/her managed care company has contracts with or will negotiate with  certain facilities, including the following:     Patient/family informed of bed offers received:   Patient chooses bed at  Physician recommends and patient chooses bed at    Patient to be transferred to  on   Patient to be transferred to facility by   The following physician request were entered in Epic:   Additional Comments:

## 2012-07-02 NOTE — Progress Notes (Signed)
Physical Therapy Treatment Patient Details Name: Madeline Serrano MRN: [id number] DOB: 14-Aug-1930 Today's Date: 07/02/2012 Time: 6213-0865 PT Time Calculation (min): 23 min  PT Assessment / Plan / Recommendation Comments on Treatment Session  At this date, pt not progressing with mobility & PT goals.   Required increased assist for all mobility today.      Follow Up Recommendations  Inpatient Rehab;Skilled nursing facility    Barriers to Discharge        Equipment Recommendations  Rolling walker with 5" wheels;3 in 1 bedside comode;Tub/shower bench    Recommendations for Other Services Rehab consult  Frequency Min 5X/week   Plan Discharge plan remains appropriate    Precautions / Restrictions Precautions Precautions: Posterior Hip Precaution Comments: Re-educated pt on all 3 precautions due to pt unable to recall any of them Restrictions Weight Bearing Restrictions: Yes RLE Weight Bearing: Partial weight bearing RLE Partial Weight Bearing Percentage or Pounds: 50       Mobility  Bed Mobility Bed Mobility: Supine to Sit;Sitting - Scoot to Edge of Bed Supine to Sit: 3: Mod assist;With rails;HOB elevated Sitting - Scoot to Edge of Bed: 4: Min assist Details for Bed Mobility Assistance: Assist for LE's & to bring shoulders/trunk upright.  Cues for sequencing & technique.   Transfers Transfers: Sit to Stand;Stand to Sit Sit to Stand: 3: Mod assist;With upper extremity assist;From bed Stand to Sit: 4: Min assist;With upper extremity assist;With armrests;To chair/3-in-1 Details for Transfer Assistance: Assist to achieve standing from bed & to control descent to recliner.  Cues for hand placement & technique.   Ambulation/Gait Ambulation/Gait Assistance: 4: Min assist Ambulation Distance (Feet): 5 Feet Assistive device: Rolling walker Ambulation/Gait Assistance Details: max directional cues for sequencing & technique.  Encouragement to increase weight bearing through RLE (no more  than 50% though), due to pt bearing significant amount of weight through UE's & becoming very fatigued.  When asked if pain was limiting her, she stated "no, it doesn't really hurt", but that she is nervous/scared about putting weight through RLE.    Gait Pattern: Step-to pattern;Decreased step length - right;Decreased step length - left;Decreased stance time - right;Decreased weight shift to right;Shuffle Gait velocity: decreased General Gait Details: Pt appears to barely place any weight through RLE  Stairs: No Wheelchair Mobility Wheelchair Mobility: No    Exercises Total Joint Exercises Ankle Circles/Pumps: AROM;Both;20 reps Quad Sets: AROM;Both;10 reps Gluteal Sets: AROM;Both;10 reps Heel Slides: AAROM;Right;10 reps Hip ABduction/ADduction: AAROM;Right;10 reps Straight Leg Raises: AAROM;Right;10 reps     PT Goals Acute Rehab PT Goals PT Goal Formulation: With patient Time For Goal Achievement: 07/04/12 Potential to Achieve Goals: Good Pt will Stand: with modified independence PT Goal: Stand - Progress: Not progressing Pt will Ambulate: >150 feet;with modified independence;with rolling walker PT Goal: Ambulate - Progress: Not progressing Pt will Go Up / Down Stairs: 1-2 stairs;with modified independence;with rolling walker  Visit Information  Last PT Received On: 07/02/12 Assistance Needed: +2 (safety to increase ambulation distance)    Subjective Data  Patient Stated Goal: To go to rehab then eventually go home   Cognition  Overall Cognitive Status: Appears within functional limits for tasks assessed/performed Arousal/Alertness: Awake/alert Orientation Level: Appears intact for tasks assessed;Oriented X4 / Intact Behavior During Session: [facility] for tasks performed    Balance     End of Session PT - End of Session Equipment Utilized During Treatment: Gait belt Activity Tolerance: Patient limited by fatigue Patient left: in chair;with call bell/phone within reach  Nurse  Communication: Mobility status     Verdell Face, [place] [phone] 07/02/2012

## 2012-07-02 NOTE — Progress Notes (Signed)
ANTICOAGULATION CONSULT NOTE - Follow Up Consult  Pharmacy Consult for Coumadin Indication: VTE prophylaxis/Afib  Allergies  Allergen Reactions  . Celebrex (Celecoxib) Swelling  . Clarithromycin Swelling  . Statins Other (See Comments)    Hair fell out  . Sulfa Antibiotics Other (See Comments)    unknown  . Vioxx (Rofecoxib) Other (See Comments)    Renal problems  . Penicillins Rash    Patient Measurements: Height: 5' 5.75" (167 cm) Weight: 180 lb (81.647 kg) IBW/kg (Calculated) : 58.72    Vital Signs: Temp: 98 F (36.7 C) (09/29 0624) BP: 106/43 mmHg (09/29 0624) Pulse Rate: 100  (09/29 0624)  Labs:  Basename 07/02/12 0550 07/01/12 0502 06/30/12 0832 06/30/12 0610  HGB 7.8* 8.7* -- --  HCT 22.8* 24.7* -- 27.5*  PLT 246 195 -- 176  APTT -- -- -- --  LABPROT 21.2* 17.4* 18.1* --  INR 1.92* 1.47 1.55* --  HEPARINUNFRC -- -- -- --  CREATININE 1.51* 1.23* -- 1.46*  CKTOTAL -- -- -- --  CKMB -- -- -- --  TROPONINI -- -- -- --    Estimated Creatinine Clearance: 31.9 ml/min (by C-G formula based on Cr of 1.51).    Assessment:  Madeline Serrano was admitted for femoral neck fracture s/p fall, she is now s/p R hip arthroplasty. She was on Coumadin PTA for afib, INR was > 6 at admission, and she is s/p Vit K (5mg  IV) reversal on 9/26. INR today is subtherapeutic at 1.92. H/H and plts have steadily decreased since admit, without overt bleeding. Spoke to both nurse and patient about this. Pt seems well with good energy and not SoB, but her pulse has been rising and her is BP downtrending. Spoke to PA about this and he will be evaluating her further.  Noted home Coumadin regimen was 4 MG M, F; 2mg  TWThSatSun.   Goal of Therapy:  INR 2-3  Monitor platelets by anticoagulation protocol: Yes   Plan:  - Coumadin 1mg  PO x 1 today  - Will f/up daily PT/INR  - Lovenox bridge D/Ced for INR > 1.8   Thanks, Vania Rea. Darin Engels.D. Clinical Pharmacist Pager [phone] Phone  [phone] 07/02/2012 9:52 AM

## 2012-07-02 NOTE — Progress Notes (Signed)
Clinical Social Work Department BRIEF PSYCHOSOCIAL ASSESSMENT 07/02/2012  Patient:  Madeline Serrano, Madeline Serrano     Account Number:  [id number]     Admit date:  06/29/2012  Clinical Social Worker:  Skip Mayer  Date/Time:  07/02/2012 12:00 N  Referred by:  Physician  Date Referred:  07/02/2012 Referred for  SNF Placement   Other Referral:   Interview type:  Patient Other interview type:    PSYCHOSOCIAL DATA Living Status:  ALONE Admitted from facility:   Level of care:   Primary support name:  [name] Primary support relationship to patient:  CHILD, ADULT Degree of support available:   Adequate, per pt    CURRENT CONCERNS Current Concerns  Post-Acute Placement   Other Concerns:    SOCIAL WORK ASSESSMENT / PLAN CSW met with pt re: d/c plan  Pt admitted from home alone.  Pt has son and DIL who live locally and another son in [place].  Pt reports independent PTA and was caring for her 2 grandchildren after school every day.  Pt reports her son in [place] plans to stay with her for a short time post rehab.  CSW explained need for SNF as b/u plan to CIR and pt is agreeable to this plan.  Pt prefers [facility] if CIR unable to accept pt.  CSW to complete FL2 and initiate SNF search as b/u plan to CIR.  Weekday CSW to f/u with offers if needed.  FL2 on chart for MD signature.   Assessment/plan status:  Information/Referral to [facility] Other assessment/ plan:   Information/referral to community resources:   SNF  PTAR  CIR    PATIENT'S/FAMILY'S RESPONSE TO PLAN OF CARE: CSW explained need for SNF as b/u plan to CIR and pt reports agreeable to this plan.  Pt prefers [facility] if SNF needed.  Pt engaged during visit and verbalized appreciation for CSW assist.        Dellie Burns, MSW, LCSWA [phone] (Weekends 8:00am-4:30pm)

## 2012-07-02 NOTE — Progress Notes (Signed)
Occupational Therapy Evaluation Patient Details Name: Madeline Serrano MRN: [id number] DOB: 1930-03-01 Today's Date: 07/02/2012 Time: 7829-5621 OT Time Calculation (min): 51 min  OT Assessment / Plan / Recommendation Clinical Impression  82 s/p mechanical fall with R femur fx. Pt underwent R hemiarthroplasty - posterior. Educated family/pt on posterior hip precautions. Family stated they were unaware that pt would have any precautions. PTA, pt was indpendent with all mobility and self care and took care of her 2 grand daughters. Feel that pt is an excellent rehab candidate and could achieve mod I level at CIR. Pt is motivated to return to PLOF and has supportive family. Pt will benefit from skilled Ot services to max independence with ADl and functional mobility for ADL to facilitate D/C to next venue of care.Will follow acutely.     OT Assessment  Patient needs continued OT Services    Follow Up Recommendations  Inpatient Rehab    Barriers to Discharge None    Equipment Recommendations  Rolling walker with 5" wheels;3 in 1 bedside comode;Tub/shower bench    Recommendations for Other Services Rehab consult  Frequency  Min 2X/week    Precautions / Restrictions Precautions Precautions: Posterior Hip Precaution Comments: Re-educated pt on all 3 precautions due to pt unable to recall any of them Restrictions Weight Bearing Restrictions: Yes RLE Weight Bearing: Partial weight bearing RLE Partial Weight Bearing Percentage or Pounds: 50   Pertinent Vitals/Pain Only when moving leg - 4    ADL  Eating/Feeding: Simulated;Independent Where Assessed - Eating/Feeding: Bed level Grooming: Simulated;Set up Where Assessed - Grooming: Unsupported sitting Upper Body Bathing: Simulated;Set up Where Assessed - Upper Body Bathing: Unsupported sitting Lower Body Bathing: Simulated;+1 Total assistance Where Assessed - Lower Body Bathing: Supported sit to stand Upper Body Dressing: Simulated;Set  up Where Assessed - Upper Body Dressing: Unsupported sitting Lower Body Dressing: Simulated;+1 Total assistance Where Assessed - Lower Body Dressing: Supported sit to stand Toilet Transfer: Simulated;Maximal assistance Toilet Transfer Method: Stand pivot;Sit to Barista: Other (comment) Toileting - Clothing Manipulation and Hygiene: Simulated;+1 Total assistance Where Assessed - Toileting Clothing Manipulation and Hygiene: Standing Equipment Used: Knee Immobilizer;Gait belt;Rolling walker Transfers/Ambulation Related to ADLs: Mod A sit - stand. Max A with ambulation due to L knee buckling.    OT Diagnosis: Generalized weakness;Acute pain  OT Problem List: Decreased strength;Decreased range of motion;Decreased activity tolerance;Decreased knowledge of use of DME or AE;Decreased knowledge of precautions;Cardiopulmonary status limiting activity;Obesity;Pain OT Treatment Interventions: Self-care/ADL training;Therapeutic exercise;Energy conservation;DME and/or AE instruction;Therapeutic activities;Patient/family education   OT Goals Acute Rehab OT Goals OT Goal Formulation: With patient Time For Goal Achievement: 07/16/12 Potential to Achieve Goals: Good ADL Goals Pt Will Perform Lower Body Bathing: with min assist;Sit to stand from bed;Supported;with cueing (comment type and amount);with adaptive equipment ADL Goal: Lower Body Bathing - Progress: Goal set today Pt Will Perform Lower Body Dressing: Sit to stand from bed;Supported;with adaptive equipment;with cueing (comment type and amount);with min assist ADL Goal: Lower Body Dressing - Progress: Goal set today Pt Will Transfer to Toilet: with min assist;with DME;3-in-1;Maintaining hip precautions;Maintaining weight bearing status ADL Goal: Toilet Transfer - Progress: Goal set today Pt Will Perform Toileting - Clothing Manipulation: with min assist;Sitting on 3-in-1 or toilet;Standing;with cueing (comment type and  amount) ADL Goal: Toileting - Clothing Manipulation - Progress: Goal set today Pt Will Perform Toileting - Hygiene: with supervision;Sit to stand from 3-in-1/toilet;Standing at 3-in-1/toilet;with cueing (comment type and amount) ADL Goal: Toileting - Hygiene - Progress: Goal  set today Additional ADL Goal #1: Pt will state 3/3 hip precautions independently. ADL Goal: Additional Goal #1 - Progress: Goal set today  Visit Information  Last OT Received On: 07/02/12 Assistance Needed: +1    Subjective Data      Prior Functioning     Home Living Lives With: Alone Available Help at Discharge: Family;Available PRN/intermittently Type of Home: House Home Access: Stairs to enter [employer] of Steps: 1 (3 steps in front with rails) Entrance Stairs-Rails: None Home Layout: Two level;Able to live on main level with bedroom/bathroom Bathroom Shower/Tub: Walk-in shower;Door [employer] Toilet: Standard Bathroom Accessibility: Yes How Accessible: Accessible via walker Home Adaptive Equipment: Built-in shower seat;Straight cane Prior Function Level of Independence: Independent Able to Take Stairs?: Yes Driving: Yes Vocation: Retired Musician: [employer] Dominant Hand: Right         Vision/Perception     Cognition  Overall Cognitive Status: Appears within functional limits for tasks assessed/performed Arousal/Alertness: Awake/alert Orientation Level: Appears intact for tasks assessed Behavior During Session: Anxious    Extremity/Trunk Assessment Right Upper Extremity Assessment RUE ROM/Strength/Tone: WFL for tasks assessed RUE Sensation: WFL - Light Touch;WFL - Proprioception RUE Coordination: WFL - gross/fine motor Left Upper Extremity Assessment LUE ROM/Strength/Tone: Deficits;Due to pain LUE ROM/Strength/Tone Deficits: ? L shoulder pain. ? bursitis? recent onset LUE Sensation: WFL - Light Touch;WFL - Proprioception LUE Coordination: WFL - gross/fine  motor Right Lower Extremity Assessment RLE ROM/Strength/Tone: Deficits;Due to pain;Due to precautions Left Lower Extremity Assessment LLE ROM/Strength/Tone: [facility] for tasks assessed (L knee buckled) Trunk Assessment Trunk Assessment: Normal     Mobility Bed Mobility Bed Mobility: Supine to Sit;Sitting - Scoot to [place] of Bed;Sit to Supine;Scooting to [facility] Supine to Sit: 4: Min assist;With rails;HOB elevated Sitting - Scoot to Edge of Bed: 5: Supervision Sit to Supine: 2: Max assist;HOB flat;With rail Scooting to [facility]: 1: +1 Total assist Details for Bed Mobility Assistance: A for management of RLE and maitaining hip precautions Transfers Transfers: Sit to Stand;Stand to Sit Sit to Stand: 3: Mod assist;With upper extremity assist;From bed Stand to Sit: 3: Mod assist;With upper extremity assist;To bed Details for Transfer Assistance: vc for hip precautions. vc for safe technique/use of RW                    End of Session OT - End of Session Equipment Utilized During Treatment: Gait belt;Right knee immobilizer Activity Tolerance: Patient limited by fatigue;Other (comment) (pt with low Hgb. getting ready to receive blood) Patient left: in bed;with call bell/phone within reach;with family/visitor present;with nursing in room Nurse Communication: Mobility status;Precautions  GO     [facility] 07/02/2012, 3:48 PM [facility], OTR/L  [phone] 07/02/2012

## 2012-07-02 NOTE — Progress Notes (Signed)
Patient still does not have the urge to void. Tech did bladder scan.  Shows 344 ml in bladder. Patient is refusing to get on [facility] or even bed pan. Will continue to monitor.

## 2012-07-02 NOTE — Progress Notes (Signed)
Patient bladder scanned per MD order.  Scanned for .  Patient is still unable to void on her own.  Patient has tried to sit on [facility] several times with no results.  Patient reports drinking liquids and has NS@100 .  Na-127  MD aware.  Will continue to monitor and I/O cath if no void.

## 2012-07-02 NOTE — Progress Notes (Signed)
Pharmacy notified ortho MD regarding concern of Hgb 7.8 and symptomatic with hypotension and tachycardia.  PRBCs ordered.  Notified Internal Med MD per ortho before administering.  Also discussed the patient's inability to void.  MD would rather not place foley at this time and continue to I/O cath.  MD aware of Na level.  MD ok with one time dose of diuretic after 1 unit of blood.  Will recheck CBC in AM.  Will continue to monitor.

## 2012-07-02 NOTE — Progress Notes (Addendum)
Subjective: 2 Days Post-Op Procedure(s) (LRB): ARTHROPLASTY BIPOLAR HIP (Right) Patient reports pain as moderate.    Objective: Vital signs in last 24 hours: Temp:  [98 F (36.7 C)-98.5 F (36.9 C)] 98 F (36.7 C) (09/29 0624) Pulse Rate:  [81-102] 100  (09/29 0624) Resp:  [18-20] 18  (09/29 1142) BP: (98-116)/(38-43) 106/43 mmHg (09/29 0624) SpO2:  [95 %-99 %] 95 % (09/29 1142)  Intake/Output from previous day: 09/28 0701 - 09/29 0700 In: 630 [P.O.:600] Out: 850 [Urine:850] Intake/Output this shift:     Basename 07/02/12 0550 07/01/12 0502 06/30/12 0610  HGB 7.8* 8.7* 9.4*    Basename 07/02/12 0550 07/01/12 0502  WBC 9.4 8.5  RBC 2.47* 2.65*  HCT 22.8* 24.7*  PLT 246 195    Basename 07/02/12 0550 07/01/12 0502  NA 127* 129*  K 4.5 4.1  CL 98 99  CO2 17* 21  BUN 24* 24*  CREATININE 1.51* 1.23*  GLUCOSE 121* 93  CALCIUM 8.4 8.4    Basename 07/02/12 0550 07/01/12 0502  LABPT -- --  INR 1.92* 1.47   Dressing clean, dry, intact. No change neurovascular status    Assessment/Plan: 2 Days Post-Op Procedure(s) (LRB): ARTHROPLASTY BIPOLAR HIP (Right) Up with therapy Coumadin DVT prophylaxis pharm to dose  BEDNARZ,PAUL A 07/02/2012, 12:01 PM  Patient with increased heart rate and complaining of being cold. Denies dizziness when up with PT. Reports no Chest pain or SOB.  PE: General - awake and alert , shivering from being cold Abdomen- soft non tender Right hip - dressing clean dry and intact, thigh compartments soft   A/P: 1. Patient with symptoms of ABLA secondary to surgery- will transfuse 2 units of PRBC's 2. Monitor Hgb and INR

## 2012-07-02 NOTE — Progress Notes (Signed)
At 0530 patient still did not void, did bladder scan, she had 535 mL of urine in her bladder.  We helped her up to the [facility], ran water and still nothing.  I did an in and out catheter at 0615 and 850 mL of urine was removed.

## 2012-07-02 NOTE — Progress Notes (Signed)
Subjective: Pt did require I/O cath yesterday secondary to urine retention after removal of foley. No complaints otherwise. Pain well controlled Objective: Vital signs in last 24 hours: Temp:  [98 F (36.7 C)-98.5 F (36.9 C)] 98 F (36.7 C) (09/29 0624) Pulse Rate:  [81-102] 100  (09/29 0624) Resp:  [18-20] 18  (09/29 0800) BP: (98-116)/(38-43) 106/43 mmHg (09/29 0624) SpO2:  [95 %-99 %] 95 % (09/29 0832) Weight change:  Last BM Date: 06/29/12  CBG (last 3)  No results found for this basename: GLUCAP:3 in the last 72 hours  Intake/Output from previous day: 09/28 0701 - 09/29 0700 In: 630 [P.O.:600] Out: 850 [Urine:850] Intake/Output this shift:    General appearance: alert and no distress Eyes: no scleral icterus Throat: oropharynx moist without erythema Resp: clear to auscultation bilaterally Cardio: regular rate and rhythm GI: soft, non-tender; bowel sounds normal; no masses,  no organomegaly Extremities: no clubbing, cyanosis or edema; pulses 2+; brace on R leg  Lab Results:  [facility] 07/02/12 0550 07/01/12 0502  NA 127* 129*  K 4.5 4.1  CL 98 99  CO2 17* 21  GLUCOSE 121* 93  BUN 24* 24*  CREATININE 1.51* 1.23*  CALCIUM 8.4 8.4  MG -- --  PHOS -- --   No results found for this basename: AST:2,ALT:2,ALKPHOS:2,BILITOT:2,PROT:2,ALBUMIN:2 in the last 72 hours  Basename 07/02/12 0550 07/01/12 0502  WBC 9.4 8.5  NEUTROABS -- --  HGB 7.8* 8.7*  HCT 22.8* 24.7*  MCV 92.3 93.2  PLT 246 195   Lab Results  Component Value Date   INR 1.92* 07/02/2012   INR 1.47 07/01/2012   INR 1.55* 06/30/2012    Studies/Results: Dg Pelvis Portable  06/30/2012  *RADIOLOGY REPORT*  Clinical Data: Right hip replacement  PORTABLE PELVIS  Comparison: CT 09/22/2006 the right  Findings: Right hip arthroplasty is intact without evidence of dislocation or fracture.  There is subcutaneous gas along the left flank consistent recent surgery.  IMPRESSION: Note complication following right  hip arthroplasty.   Original Report Authenticated By: Genevive Bi, M.D.      Medications: Scheduled:    . calcium carbonate  1 tablet Oral TID WC  . carvedilol  12.5 mg Oral BID WC  . clindamycin (CLEOCIN) IV  600 mg Intravenous Q6H  . docusate sodium  100 mg Oral BID  . enoxaparin (LOVENOX) injection  40 mg Subcutaneous Q24H  . ferrous sulfate  325 mg Oral TID PC  . flecainide  50 mg Oral BID  . loratadine  10 mg Oral Daily  . multivitamin-lutein  1 capsule Oral Daily  . pantoprazole  40 mg Oral Daily  . vitamin B-6  200 mg Oral BID  . tiotropium  18 mcg Inhalation Daily  . warfarin  2 mg Oral ONCE-1800  . Warfarin - Pharmacist Dosing Inpatient   Does not apply q1800   Continuous:    . sodium chloride 100 mL/hr at 07/02/12 0522    Assessment/Plan: Principal Problem: 1. Right subcapital femoral neck fracture-s/p arthroplasty of Bipolar hip.  Pt will need inpatient rehab/SNF. SW consult placed. lovenox for VTE ppx   Active Problems: 2. Hyponatremia- Na stable b/w 127-130. Cont to monitor. Her baseline is around 130.  Diuretics held. 3. Coagulopathy- being followed by pharmacy protocol. On coumadin 4. Paroxysmal Atrial fibrillation/flutter- continue coreg and tikosyn for rate and rhythm control.  Restarted Coumadin per protocol, allowing INR to drift up    5. Osteoporosis- will address as outpatient.  Consider Prolia. 6. Hypertension-  BP is low normal. Continue to monitor. Pt only on coreg which is needed for problem # 4, so not much left to titrate down.  ARB and diuretic held.   7. CKD - Cr remaining at baseline of 1.2-1.5, but patient now having some urinary retention. Order placed for bladder scans and I/O cath prn. Cont to monitor for improvement.  #. Disposition- anticipate need for SNF rehab, SW consult placed.  Anticipate discharge Monday if no perioperative complications.    LOS: 3 days   Oleta Gunnoe 07/02/2012, 10:07 AM

## 2012-07-03 ENCOUNTER — Encounter (HOSPITAL_COMMUNITY): Payer: Self-pay | Admitting: Orthopedic Surgery

## 2012-07-03 DIAGNOSIS — R339 Retention of urine, unspecified: Secondary | ICD-10-CM | POA: Diagnosis not present

## 2012-07-03 DIAGNOSIS — D62 Acute posthemorrhagic anemia: Secondary | ICD-10-CM | POA: Diagnosis not present

## 2012-07-03 DIAGNOSIS — W19XXXA Unspecified fall, initial encounter: Secondary | ICD-10-CM

## 2012-07-03 DIAGNOSIS — S72009A Fracture of unspecified part of neck of unspecified femur, initial encounter for closed fracture: Secondary | ICD-10-CM

## 2012-07-03 LAB — BASIC METABOLIC PANEL
BUN: 20 mg/dL (ref 6–23)
CO2: 16 mEq/L — ABNORMAL LOW (ref 19–32)
Chloride: 99 mEq/L (ref 96–112)
Creatinine, Ser: 1.27 mg/dL — ABNORMAL HIGH (ref 0.50–1.10)

## 2012-07-03 LAB — CBC WITH DIFFERENTIAL/PLATELET
Basophils Absolute: 0 10*3/uL (ref 0.0–0.1)
Eosinophils Absolute: 0.3 10*3/uL (ref 0.0–0.7)
Eosinophils Relative: 3 % (ref 0–5)
Lymphs Abs: 1 10*3/uL (ref 0.7–4.0)
MCH: 30.8 pg (ref 26.0–34.0)
MCV: 89.3 fL (ref 78.0–100.0)
Platelets: 224 10*3/uL (ref 150–400)
RDW: 14.1 % (ref 11.5–15.5)

## 2012-07-03 LAB — TYPE AND SCREEN: Unit division: 0

## 2012-07-03 LAB — CBC
HCT: 22.9 % — ABNORMAL LOW (ref 36.0–46.0)
MCHC: 34.1 g/dL (ref 30.0–36.0)
MCV: 90.9 fL (ref 78.0–100.0)
RDW: 14.2 % (ref 11.5–15.5)

## 2012-07-03 MED ORDER — ACETAMINOPHEN 325 MG PO TABS
650.0000 mg | ORAL_TABLET | Freq: Once | ORAL | Status: AC
  Administered 2012-07-03: 650 mg via ORAL
  Filled 2012-07-03: qty 1

## 2012-07-03 NOTE — Progress Notes (Signed)
Patient called for assistance to use the bed pan at 0320 this morning.  She was able to void on her own 250 mL.  Will continue to monitor.

## 2012-07-03 NOTE — Progress Notes (Signed)
Subjective: Difficulty voiding yesterday.  Urinated X2 this am without difficulty.  Pain controlled.  Tolerated 1 unit PRBCs.  Denies SOB.  Objective: Vital signs in last 24 hours: Temp:  [97.5 F (36.4 C)-98.7 F (37.1 C)] 98.4 F (36.9 C) (09/30 0612) Pulse Rate:  [99-112] 112  (09/30 0612) Resp:  [18-19] 18  (09/30 0612) BP: (117-135)/(50-64) 132/64 mmHg (09/30 0612) SpO2:  [95 %-100 %] 100 % (09/30 0753) Weight change:  Last BM Date: 06/29/12  CBG (last 3)  No results found for this basename: GLUCAP:3 in the last 72 hours  Intake/Output from previous day: 09/29 0701 - 09/30 0700 In: 2220 [P.O.:720; I.V.:1150; Blood:350] Out: 1425 [Urine:1425] Intake/Output this shift:    General appearance: alert and pale Eyes: no scleral icterus Throat: oropharynx moist without erythema Resp: clear to auscultation bilaterally Cardio: regular rate and rhythm GI: soft, non-tender; bowel sounds normal; no masses,  no organomegaly Extremities: no clubbing, cyanosis; trace edema   Lab Results:  Basename 07/03/12 0545 07/02/12 0550  NA 126* 127*  K 4.3 4.5  CL 99 98  CO2 16* 17*  GLUCOSE 108* 121*  BUN 20 24*  CREATININE 1.27* 1.51*  CALCIUM 8.0* 8.4  MG -- --  PHOS -- --   No results found for this basename: AST:2,ALT:2,ALKPHOS:2,BILITOT:2,PROT:2,ALBUMIN:2 in the last 72 hours  Basename 07/03/12 0545 07/02/12 0550  WBC 8.4 9.4  NEUTROABS -- --  HGB 7.8* 7.8*  HCT 22.9* 22.8*  MCV 90.9 92.3  PLT 180 246   Lab Results  Component Value Date   INR 2.37* 07/03/2012   INR 1.92* 07/02/2012   INR 1.47 07/01/2012   No results found for this basename: CKTOTAL:3,CKMB:3,CKMBINDEX:3,TROPONINI:3 in the last 72 hours No results found for this basename: TSH,T4TOTAL,FREET3,T3FREE,THYROIDAB in the last 72 hours No results found for this basename: VITAMINB12:2,FOLATE:2,FERRITIN:2,TIBC:2,IRON:2,RETICCTPCT:2 in the last 72 hours  Studies/Results: No results found.   Medications:  Scheduled:   . acetaminophen  650 mg Oral Once  . calcium carbonate  1 tablet Oral TID WC  . carvedilol  12.5 mg Oral BID WC  . diphenhydrAMINE  25 mg Oral Once  . docusate sodium  100 mg Oral BID  . ethacrynic acid  25 mg Oral Once  . ferrous sulfate  325 mg Oral TID PC  . flecainide  50 mg Oral BID  . loratadine  10 mg Oral Daily  . multivitamin-lutein  1 capsule Oral Daily  . pantoprazole  40 mg Oral Daily  . vitamin B-6  200 mg Oral BID  . tiotropium  18 mcg Inhalation Daily  . warfarin  1 mg Oral ONCE-1800  . Warfarin - Pharmacist Dosing Inpatient   Does not apply q1800  . DISCONTD: enoxaparin (LOVENOX) injection  40 mg Subcutaneous Q24H  . DISCONTD: ethacrynic acid  25 mg Oral Once  . DISCONTD: furosemide  20 mg Intravenous Once   Continuous:   . sodium chloride 100 mL/hr at 07/03/12 0510    Assessment/Plan: Principal Problem: 1. *Hip fracture, right s/p Right Bipolar Hip arthroplasty (POD #3)- progressing well with rehab.  INR therapeutic for DVT prophylaxis.  IR consult.  Active Problems: 2. Hyponatremia- slight decrease in Na with Lasix administration yesterday.  Decrease IV fluids and monitor.  Hold all diuretics. 3. Acute blood loss anemia- s/p 1U PRBC- will transfuse 1 additional unit today due to suboptimal response.  No evidence of ongoing blood loss. 4. Urinary retention- improved this am with spontaneous voiding.  Check Bladder scan qshift X 24  hours.   5. Coagulopathy- continue Coumadin per pharmacy. 6. Atrial fibrillation- rate increased this am.  Continue Coreg.  Transfusion should help. 7. Osteoporosis- consider Prolia as outpatient. 8. Hypertension- ARB and Maxzide on hold.  Monitor. 9. Disposition- anticipate discharge tomorrow to SNF rehab or IR.  IR consult.   LOS: 4 days   Nayib Remer,W DOUGLAS 07/03/2012, 8:24 AM

## 2012-07-03 NOTE — Progress Notes (Signed)
Patient ID: TIMIRA BIEDA, female   DOB: 29-Oct-1929, 78 y.o.   MRN: [id number] Subjective: 3 Days Post-Op Procedure(s) (LRB): ARTHROPLASTY BIPOLAR HIP (Right)    Patient reports pain as mild.  No major events.  Ready for rehab  Objective:   VITALS:   Filed Vitals:   07/03/12 0612  BP: 132/64  Pulse: 112  Temp: 98.4 F (36.9 C)  Resp: 18    Neurovascular intact Incision: dressing C/D/I  LABS  Basename 07/03/12 0545 07/02/12 0550 07/01/12 0502  HGB 7.8* 7.8* 8.7*  HCT 22.9* 22.8* 24.7*  WBC 8.4 9.4 8.5  PLT 180 246 195     Basename 07/03/12 0545 07/02/12 0550 07/01/12 0502  NA 126* 127* 129*  K 4.3 4.5 4.1  BUN 20 24* 24*  CREATININE 1.27* 1.51* 1.23*  GLUCOSE 108* 121* 93     Basename 07/03/12 0545 07/02/12 0550  LABPT -- --  INR 2.37* 1.92*     Assessment/Plan: 3 Days Post-Op Procedure(s) (LRB): ARTHROPLASTY BIPOLAR HIP (Right)   Up with therapy Discharge to SNF when medically stable RTC 2 weeks to se Dr. Charlann Boxer at [facility], [phone] PWB 50% right leg for 4-6 weeks DVT prophylaxis for 2 weeks then if on antiplatelet drug can restart. Will follow but please call with questions [phone]

## 2012-07-03 NOTE — Progress Notes (Signed)
Patient is still not able to void on her own.  Day shift did an I/O catheter at 1600 on 07/02/12.  That was the second time we had to I/O cath her.  Tonight at 2330, we helped patient to the [facility] where she voided only 25 mL on her own.  She sat there for about 30 minutes.  I used heating pad on her lower abdomen.  She has no feeling to void. She is drinking fluids and has IV fluids going. I will monitor but if this continues, I will follow hospital policy and place a foley.

## 2012-07-03 NOTE — Progress Notes (Signed)
ANTICOAGULATION CONSULT - COUMADIN  Pharmacy Consult for Coumadin  HPI: 78 y/o female was admitted for femoral neck fracture s/p fall, she is now s/p R hip arthroplasty. She was on Coumadin PTA for afib.   Allergies: Allergies  Allergen Reactions  . Sulfa Antibiotics Other (See Comments)    Madeline Serrano  . Celebrex (Celecoxib) Swelling  . Clarithromycin Swelling  . Statins Other (See Comments)    Hair fell out  . Vioxx (Rofecoxib) Other (See Comments)    Renal problems  . Penicillins Rash    Height/Weight: Height: 5' 5.75" (167 cm) Weight: 180 lb (81.647 kg) IBW/kg (Calculated) : 58.72   Vitals: Blood pressure 132/64, pulse 112, temperature 98.4 F (36.9 C), temperature source Oral, resp. rate 18, height 5' 5.75" (1.67 m), weight 180 lb (81.647 kg), SpO2 100.00%.  Current active problems: Principal Problem:  *Hip fracture, right Active Problems:  Hyponatremia  Coagulopathy  Atrial fibrillation  Osteoporosis  Hypertension  Acute blood loss anemia  Urinary retention   Medical / Surgical History: Diagnosis  . A-fib  . Hypertension  . High cholesterol  . Macular degeneration   History reviewed. No pertinent past surgical history.  Current Labs:   [facility] 07/03/12 0545 07/02/12 0550 07/01/12 0502  HGB 7.8* 7.8* --  HCT 22.9* 22.8* 24.7*  PLT 180 246 195  LABPROT 24.8* 21.2* 17.4*  INR 2.37* 1.92* 1.47  CREATININE 1.27* 1.51* 1.23*    Lab Results  Component Value Date   INR 2.37* 07/03/2012   INR 1.92* 07/02/2012   INR 1.47 07/01/2012   Estimated Creatinine Clearance: 37.9 ml/min (by C-G formula based on Cr of 1.27).  Pertinent Medication History: Medication Sig  . warfarin (COUMADIN) 4 MG tablet Take 2-4 mg by mouth daily. Takes 1 tablet on Monday, and Friday Takes 1/2 tablet the rest of the week   Scheduled:    . calcium carbonate  1 tablet Oral TID WC  . carvedilol  12.5 mg Oral BID WC  . diphenhydrAMINE  25 mg Oral Once  . docusate  sodium  100 mg Oral BID  . ferrous sulfate  325 mg Oral TID PC  . flecainide  50 mg Oral BID  . loratadine  10 mg Oral Daily  . multivitamin-lutein  1 capsule Oral Daily  . pantoprazole  40 mg Oral Daily  . vitamin B-6  200 mg Oral BID  . tiotropium  18 mcg Inhalation Daily  . warfarin  1 mg Oral ONCE-1800  . DISCONTD: enoxaparin (LOVENOX) injection  40 mg Subcutaneous Q24H   Assessment:  INR reported as 2.37  <  1.92  which is within target range.  Significant rise to > 2 after only 2 doses of Coumadin. [INR on admission > 6]  Patient to receive 1 unit PRBC's today.  MD feels there is "no evidence of ongoing blood loss".  Lovenox discontinued.  Goals:  Target INR of 2-3.  Plan:  Will hold Coumadin as the INR will continue to climb given the rapid rise in INR after Coumadin restart.    Daily INR's, CBC.  Jakala Herford, Elisha Headland, Pharm. D. 07/03/2012, 9:08 AM

## 2012-07-03 NOTE — Progress Notes (Signed)
Patient has voided again on bed pan (325 mL) for a grand total of 600 mL for this shift.

## 2012-07-03 NOTE — Consult Note (Signed)
Physical Medicine and Rehabilitation Consult Reason for Consult: Right hip fracture.  Referring Physician:  Dr. Clelia Croft   HPI: Madeline Serrano is a 78 y.o. female with history of HTN, Atrial Fib with chronic coumadin who fell night prior to admission on 06/29/12 with complaints of right hip pain.  Workup revealed nondisplaced right subcapital femoral neck fracture and sodium 123 at admission. Coumadin reversed and patient underwent right hip hemi by Dr Charlann Boxer on 06/30/12. Post op PWB RLE and coumadin resumed.  Did develop ABLA and was transfused with 1 unit PRBC for Hgb @ 7.8. Has had problems voiding and cath for 850 cc yesterday.  Therapies ongoing and patient needs cues for sequencing, as well as maintaining PWB status.  MD, therapy team recommending CIR.   ROS Past Medical History  Diagnosis Date  . A-fib   . Hypertension   . High cholesterol   . Macular degeneration    History reviewed. No pertinent past surgical history.  History reviewed. No pertinent family history.  Social History:  Lives alone. reports that she has never smoked. She does not have any smokeless tobacco history on file. She reports that she does not drink alcohol or use illicit drugs.  Allergies  Allergen Reactions  . Sulfa Antibiotics Other (See Comments)    [name]  . Celebrex (Celecoxib) Swelling  . Clarithromycin Swelling  . Statins Other (See Comments)    Hair fell out  . Vioxx (Rofecoxib) Other (See Comments)    Renal problems  . Penicillins Rash   Medications Prior to Admission  Medication Sig Dispense Refill  . calcium carbonate (OS-CAL) 600 MG TABS Take 600 mg by mouth 3 (three) times daily with meals.      . carvedilol (COREG) 12.5 MG tablet Take 18.75 mg by mouth 2 (two) times daily with a meal.       . estrogen, conjugated,-medroxyprogesterone (PREMPRO) 0.3-1.5 MG per tablet Take 1 tablet by mouth daily. 5 days a week Does not take on Tuesday and Friday      . fish oil-omega-3 fatty acids  1000 MG capsule Take 1 g by mouth daily.       . flecainide (TAMBOCOR) 50 MG tablet Take 50 mg by mouth 2 (two) times daily.        . lansoprazole (PREVACID) 15 MG capsule Take 15 mg by mouth every other day.      . Multiple Vitamins-Minerals (ICAPS) TABS Take 1 tablet by mouth daily.      Marland Kitchen olmesartan (BENICAR) 20 MG tablet Take 20 mg by mouth daily.      Marland Kitchen pyridoxine (B-6) 200 MG tablet Take 200 mg by mouth 2 (two) times daily.       . rosuvastatin (CRESTOR) 10 MG tablet Take 5 mg by mouth every Monday, Wednesday, and Friday.       . tiotropium (SPIRIVA) 18 MCG inhalation capsule Place 18 mcg into inhaler and inhale daily.      Marland Kitchen triamterene-hydrochlorothiazide (MAXZIDE) 75-50 MG per tablet Take 0.5 tablets by mouth daily.      Marland Kitchen warfarin (COUMADIN) 4 MG tablet Take 2-4 mg by mouth daily. Takes 1 tablet on Monday, and Friday Takes 1/2 tablet the rest of the week      . fexofenadine (ALLEGRA) 180 MG tablet Take 180 mg by mouth daily as needed. For allergies         Home: Home Living Lives With: Alone Available Help at Discharge: Family;Available PRN/intermittently Type of Home: House Home  Access: Stairs to enter [employer] of Steps: 1 (3 steps in front with rails) Entrance Stairs-Rails: None Home Layout: Two level;Able to live on main level with bedroom/bathroom Bathroom Shower/Tub: Walk-in shower;Door [employer] Toilet: Standard Bathroom Accessibility: Yes How Accessible: Accessible via walker Home Adaptive Equipment: Built-in shower seat;Straight cane  Functional History: Prior Function Able to Take Stairs?: Yes Driving: Yes Vocation: Retired Functional Status:  Mobility: Bed Mobility Bed Mobility: Supine to Sit;Sitting - Scoot to [place] of Bed;Sit to Supine;Scooting to [facility] Supine to Sit: 4: Min assist;With rails;HOB elevated Sitting - Scoot to Edge of Bed: 5: Supervision Sit to Supine: 2: Max assist;HOB flat;With rail Scooting to HOB: 1: +1 Total  assist Transfers Transfers: Sit to Stand;Stand to Sit Sit to Stand: 3: Mod assist;With upper extremity assist;From bed Stand to Sit: 3: Mod assist;With upper extremity assist;To bed Ambulation/Gait Ambulation/Gait Assistance: 4: Min assist Ambulation Distance (Feet): 5 Feet Assistive device: Rolling walker Ambulation/Gait Assistance Details: max directional cues for sequencing & technique.  Encouragement to increase weight bearing through RLE (no more than 50% though), due to pt bearing significant amount of weight through UE's & becoming very fatigued.   Gait Pattern: Step-to pattern;Decreased step length - right;Decreased step length - left;Decreased stance time - right;Decreased weight shift to right;Shuffle Gait velocity: decreased General Gait Details: Pt appears to barely place any weight through RLE  Stairs: No Wheelchair Mobility Wheelchair Mobility: No  ADL: ADL Eating/Feeding: Simulated;Independent Where Assessed - Eating/Feeding: Bed level Grooming: Simulated;Set up Where Assessed - Grooming: Unsupported sitting Upper Body Bathing: Simulated;Set up Where Assessed - Upper Body Bathing: Unsupported sitting Lower Body Bathing: Simulated;+1 Total assistance Where Assessed - Lower Body Bathing: Supported sit to stand Upper Body Dressing: Simulated;Set up Where Assessed - Upper Body Dressing: Unsupported sitting Lower Body Dressing: Simulated;+1 Total assistance Where Assessed - Lower Body Dressing: Supported sit to stand Toilet Transfer: Simulated;Maximal assistance Toilet Transfer Method: Stand pivot;Sit to Barista: Other (comment) Equipment Used: Knee Immobilizer;Gait belt;Rolling walker Transfers/Ambulation Related to ADLs: Mod A sit - stand. Max A with ambulation due to L knee buckling.  Cognition: Cognition Arousal/Alertness: Awake/alert Orientation Level: Oriented X4 Cognition Overall Cognitive Status: Appears within functional limits for  tasks assessed/performed Arousal/Alertness: Awake/alert Orientation Level: Appears intact for tasks assessed Behavior During Session: Anxious  Blood pressure 132/64, pulse 112, temperature 98.4 F (36.9 C), temperature source Oral, resp. rate 18, height 5' 5.75" (1.67 m), weight 81.647 kg (180 lb), SpO2 100.00%. Physical Exam  Nursing note and vitals reviewed. Constitutional: She is oriented to person, place, and time.  Musculoskeletal:       Right hip wound Clean and dressed, Right leg appropriately tender.  Neurological: She is alert and oriented to person, place, and time. She has normal reflexes. No cranial nerve deficit or sensory deficit. She displays a negative Romberg sign.  Psychiatric: She has a normal mood and affect. Her behavior is normal. Judgment and thought content normal.    Results for orders placed during the hospital encounter of 06/29/12 (from the past 24 hour(s))  PREPARE RBC (CROSSMATCH)     Status: Normal   Collection Time   07/02/12  2:03 PM      Component Value Range   Order Confirmation ORDER PROCESSED BY BLOOD BANK    BASIC METABOLIC PANEL     Status: Abnormal   Collection Time   07/03/12  5:45 AM      Component Value Range   Sodium 126 (*) 135 - 145 mEq/L  Potassium 4.3  3.5 - 5.1 mEq/L   Chloride 99  96 - 112 mEq/L   CO2 16 (*) 19 - 32 mEq/L   Glucose, Bld 108 (*) 70 - 99 mg/dL   BUN 20  6 - 23 mg/dL   Creatinine, Ser 1.61 (*) 0.50 - 1.10 mg/dL   Calcium 8.0 (*) 8.4 - 10.5 mg/dL   GFR calc non Af Amer 39 (*) >90 mL/min   GFR calc Af Amer 45 (*) >90 mL/min  CBC     Status: Abnormal   Collection Time   07/03/12  5:45 AM      Component Value Range   WBC 8.4  4.0 - 10.5 K/uL   RBC 2.52 (*) 3.87 - 5.11 MIL/uL   Hemoglobin 7.8 (*) 12.0 - 15.0 g/dL   HCT 09.6 (*) 04.5 - 40.9 %   MCV 90.9  78.0 - 100.0 fL   MCH 31.0  26.0 - 34.0 pg   MCHC 34.1  30.0 - 36.0 g/dL   RDW 81.1  91.4 - 78.2 %   Platelets 180  150 - 400 K/uL  PROTIME-INR     Status:  Abnormal   Collection Time   07/03/12  5:45 AM      Component Value Range   Prothrombin Time 24.8 (*) 11.6 - 15.2 seconds   INR 2.37 (*) 0.00 - 1.49   No results found.  Assessment/Plan: Diagnosis: Right Femoral Neck Fx 1. Does the need for close, 24 hr/day medical supervision in concert with the patient's rehab needs make it unreasonable for this patient to be served in a less intensive setting? Yes and Potentially 2. Co-Morbidities requiring supervision/potential complications: htn, abla, afib 3. Due to bladder management, bowel management, safety, skin/wound care, disease management, medication administration, pain management and patient education, does the patient require 24 hr/day rehab nursing? Yes 4. Does the patient require coordinated care of a physician, rehab nurse, PT (1-2 hrs/day, 5 days/week) and OT (1-2 hrs/day, 5 days/week) to address physical and functional deficits in the context of the above medical diagnosis(es)? Yes Addressing deficits in the following areas: balance, endurance, locomotion, strength, transferring, bowel/bladder control, bathing, dressing, feeding, grooming, toileting and psychosocial support 5. Can the patient actively participate in an intensive therapy program of at least 3 hrs of therapy per day at least 5 days per week? Yes 6. The potential for patient to make measurable gains while on inpatient rehab is excellent 7. Anticipated functional outcomes upon discharge from inpatient rehab are mod I to supervision with PT, mod I to min assist with OT, n/a with SLP. 8. Estimated rehab length of stay to reach the above functional goals is: ? 10 days 9. Does the patient have adequate social supports to accommodate these discharge functional goals? Yes 10. Anticipated D/C setting: Home 11. Anticipated post D/C treatments: HH therapy 12. Overall Rehab/Functional Prognosis: excellent  RECOMMENDATIONS: This patient's condition is appropriate for continued  rehabilitative care in the following setting: CIR Patient has agreed to participate in recommended program. Yes Note that insurance prior authorization may be required for reimbursement for recommended care.  Comment:Rehab RN to follow up.   Ivory Broad, MD     07/03/2012

## 2012-07-03 NOTE — Progress Notes (Signed)
Pt voided 250cc. Bladder scan performed per MD order. Scan showed 735cc. Notified Dr Clelia Croft of results and I/O cathed per his order. Results of cath are 750cc urine. Will continue to monitor output and scan Q 8hrs.

## 2012-07-03 NOTE — Progress Notes (Signed)
Physical Therapy Treatment Patient Details Name: Madeline Serrano MRN: [id number] DOB: 1930/03/02 Today's Date: 07/03/2012 Time: 1529-1600 PT Time Calculation (min): 31 min  PT Assessment / Plan / Recommendation Comments on Treatment Session  Discontinued ambulation secondary to pt HR increasing to 131, RR increasing, and SpO2 dropped to 82 on room air.  Returned pt to room and placed on 2 L of O2 via [place]. Sats returned to low 90s and HR returned to mid 80s.  Notified RN of pt status during and after session.     Follow Up Recommendations  Post acute inpatient rehab    Barriers to Discharge        Equipment Recommendations  Rolling walker with 5" wheels;3 in 1 bedside comode;Tub/shower bench    Recommendations for Other Services    Frequency Min 5X/week   Plan Discharge plan remains appropriate    Precautions / Restrictions Precautions Precautions: Posterior Hip Restrictions Weight Bearing Restrictions: Yes RLE Weight Bearing: Partial weight bearing RLE Partial Weight Bearing Percentage or Pounds: 50   Pertinent Vitals/Pain See comments above.    Mobility  Bed Mobility Bed Mobility: Supine to Sit;Sitting - Scoot to [place] of Bed;Sit to Supine;Scooting to [facility] Supine to Sit: 5: Supervision;HOB flat Sitting - Scoot to Edge of Bed: 5: Supervision Transfers Transfers: Sit to Stand;Stand to Sit Sit to Stand: 4: Min assist;From bed;With upper extremity assist Stand to Sit: 4: Min assist;To chair/3-in-1;With upper extremity assist Details for Transfer Assistance: vc for hip precautions. vc for safe technique/use of RW Ambulation/Gait Ambulation/Gait Assistance: 4: Min assist Ambulation Distance (Feet): 30 Feet Assistive device: Rolling walker Ambulation/Gait Assistance Details: Repeated verbal and tactile cues for upright trunk posture.  Assist to manage walker as pt pushes walker too far ahead.   Gait Pattern: Step-to pattern;Decreased step length - right;Decreased step length -  left;Decreased stance time - right;Decreased weight shift to right;Shuffle Gait velocity: decreased Stairs: No Wheelchair Mobility Wheelchair Mobility: No    Exercises Total Joint Exercises Ankle Circles/Pumps: AROM;Both;20 reps Quad Sets: AROM;Both;10 reps Gluteal Sets: AROM;Both;10 reps Heel Slides: AAROM;Right;10 reps   PT Diagnosis:    PT Problem List:   PT Treatment Interventions:     PT Goals Acute Rehab PT Goals PT Goal Formulation: With patient Time For Goal Achievement: 07/04/12 Potential to Achieve Goals: Good Pt will Stand: with modified independence PT Goal: Stand - Progress: Progressing toward goal Pt will Ambulate: >150 feet;with modified independence;with rolling walker PT Goal: Ambulate - Progress: Progressing toward goal Pt will Go Up / Down Stairs: 1-2 stairs;with modified independence;with rolling walker  Visit Information  Last PT Received On: 07/03/12 Assistance Needed: +1    Subjective Data  Patient Stated Goal: To go to rehab then eventually go home   Cognition  Overall Cognitive Status: Appears within functional limits for tasks assessed/performed Arousal/Alertness: Awake/alert Orientation Level: Appears intact for tasks assessed    Balance     End of Session PT - End of Session Equipment Utilized During Treatment: Gait belt Activity Tolerance: Patient limited by fatigue Patient left: in chair;with call bell/phone within reach Nurse Communication: Mobility status;Other (comment) (tolerance to activity. )   GP     Lai Hendriks 07/03/2012, 4:56 PM Kohei Antonellis L. Meryn Sarracino DPT [phone]

## 2012-07-04 ENCOUNTER — Inpatient Hospital Stay (HOSPITAL_COMMUNITY): Payer: Medicare Other

## 2012-07-04 DIAGNOSIS — M1990 Unspecified osteoarthritis, unspecified site: Secondary | ICD-10-CM

## 2012-07-04 HISTORY — DX: Unspecified osteoarthritis, unspecified site: M19.90

## 2012-07-04 LAB — BASIC METABOLIC PANEL
Calcium: 8.4 mg/dL (ref 8.4–10.5)
Creatinine, Ser: 1.21 mg/dL — ABNORMAL HIGH (ref 0.50–1.10)
GFR calc non Af Amer: 41 mL/min — ABNORMAL LOW (ref >90)
Glucose, Bld: 104 mg/dL — ABNORMAL HIGH (ref 70–99)
Sodium: 129 mEq/L — ABNORMAL LOW (ref 135–145)

## 2012-07-04 LAB — CBC
MCH: 30.3 pg (ref 26.0–34.0)
Platelets: 231 10*3/uL (ref 150–400)
RBC: 3 MIL/uL — ABNORMAL LOW (ref 3.87–5.11)
RDW: 14.2 % (ref 11.5–15.5)

## 2012-07-04 LAB — PROTIME-INR: Prothrombin Time: 26 seconds — ABNORMAL HIGH (ref 11.6–15.2)

## 2012-07-04 LAB — URINALYSIS, ROUTINE W REFLEX MICROSCOPIC
Bilirubin Urine: NEGATIVE
Glucose, UA: NEGATIVE mg/dL
Ketones, ur: NEGATIVE mg/dL
Protein, ur: 30 mg/dL — AB

## 2012-07-04 LAB — TYPE AND SCREEN

## 2012-07-04 LAB — URINE MICROSCOPIC-ADD ON

## 2012-07-04 MED ORDER — CIPROFLOXACIN HCL 500 MG PO TABS
500.0000 mg | ORAL_TABLET | Freq: Two times a day (BID) | ORAL | Status: DC
  Administered 2012-07-04 – 2012-07-05 (×3): 500 mg via ORAL
  Filled 2012-07-04 (×5): qty 1

## 2012-07-04 MED ORDER — CARVEDILOL 25 MG PO TABS
25.0000 mg | ORAL_TABLET | Freq: Two times a day (BID) | ORAL | Status: DC
  Administered 2012-07-04 – 2012-07-05 (×3): 25 mg via ORAL
  Filled 2012-07-04 (×5): qty 1

## 2012-07-04 MED ORDER — BUMETANIDE 0.25 MG/ML IJ SOLN
0.5000 mg | Freq: Once | INTRAMUSCULAR | Status: AC
  Administered 2012-07-04: 0.5 mg via INTRAVENOUS
  Filled 2012-07-04: qty 2

## 2012-07-04 MED ORDER — WARFARIN SODIUM 1 MG PO TABS
1.0000 mg | ORAL_TABLET | Freq: Once | ORAL | Status: AC
  Administered 2012-07-04: 1 mg via ORAL
  Filled 2012-07-04: qty 1

## 2012-07-04 MED ORDER — CIPROFLOXACIN HCL 500 MG PO TABS
500.0000 mg | ORAL_TABLET | Freq: Once | ORAL | Status: DC
  Filled 2012-07-04: qty 1

## 2012-07-04 NOTE — Progress Notes (Signed)
Physical Therapy Treatment Patient Details Name: Madeline Serrano MRN: [id number] DOB: [birth year] Today's Date: 07/04/2012 Time: 4098-1191 PT Time Calculation (min): 27 min  PT Assessment / Plan / Recommendation Comments on Treatment Session  Pt admitted s/p right hip hemiarthroplasty.  Pt continues to progress with therapy.  HR stable in 110s throughout session.  DOE up to 3/4, however, with ambulation.  Distance ambulated limited by DOE and fatigue.  Very motivated.    Follow Up Recommendations  Post acute inpatient rehab    Barriers to Discharge        Equipment Recommendations  Rolling walker with 5" wheels;3 in 1 bedside comode;Tub/shower bench    Recommendations for Other Services    Frequency Min 5X/week   Plan Discharge plan remains appropriate;Frequency remains appropriate    Precautions / Restrictions Precautions Precautions: Posterior Hip Precaution Comments: Pt able to recall 3/3 posterior hip precautions. Restrictions Weight Bearing Restrictions: Yes RLE Weight Bearing: Partial weight bearing RLE Partial Weight Bearing Percentage or Pounds: 50   Pertinent Vitals/Pain None    Mobility  Bed Mobility Bed Mobility: Supine to Sit Supine to Sit: 4: Min assist;HOB flat Details for Bed Mobility Assistance: Assist for right LE due to weakness.  Cues for sequence. Transfers Transfers: Sit to Stand;Stand to Sit Sit to Stand: 4: Min assist;With upper extremity assist;From bed Stand to Sit: 4: Min assist;With upper extremity assist;To chair/3-in-1 Details for Transfer Assistance: Assist for balance with cues for safety to follow posterior hip precautions as well as PWBing right LE. Ambulation/Gait Ambulation/Gait Assistance: 4: Min assist Ambulation Distance (Feet): 45 Feet Assistive device: Rolling walker Ambulation/Gait Assistance Details: Assist for balance and to off weight right LE in order to maintain PWBing (50%).   Gait Pattern: Step-to pattern;Decreased step length  - right;Decreased stance time - right;Trunk flexed Gait velocity: decreased Stairs: No Wheelchair Mobility Wheelchair Mobility: No    Exercises Total Joint Exercises Ankle Circles/Pumps: AROM;Right;20 reps;Supine Quad Sets: AROM;Right;10 reps;Supine Gluteal Sets: AROM;Both;10 reps;Supine Heel Slides: AROM;Right;10 reps;Supine   PT Diagnosis:    PT Problem List:   PT Treatment Interventions:     PT Goals Acute Rehab PT Goals PT Goal Formulation: With patient Time For Goal Achievement: 07/04/12 Potential to Achieve Goals: Good PT Goal: Ambulate - Progress: Progressing toward goal  Visit Information  Last PT Received On: 07/04/12 Assistance Needed: +1    Subjective Data  Subjective: "I haven't had my breathing treatment, but I'll do as much as I can." Patient Stated Goal: To go to rehab then eventually go home   Cognition  Overall Cognitive Status: Appears within functional limits for tasks assessed/performed Arousal/Alertness: Awake/alert Orientation Level: Appears intact for tasks assessed Behavior During Session: [facility] for tasks performed    Balance  Balance Balance Assessed: No  End of Session PT - End of Session Equipment Utilized During Treatment: Gait belt Activity Tolerance: Patient tolerated treatment well;Patient limited by fatigue Patient left: in chair;with call bell/phone within reach Nurse Communication: Mobility status;Other (comment) (Need for respiratory treatment.  Pt with 3/4 DOE during PT.)   GP     Cephus Shelling 07/04/2012, 12:16 PM  07/04/2012 Cephus Shelling, PT, DPT [phone]

## 2012-07-04 NOTE — Progress Notes (Signed)
Spoke with [name] this morning. She stated that patient had been deemed appropriate for admission to CIR when medically stable.  This was patient's first preference.  CSW will continue to monitor and assist as needed with d/c plan. Pt has follow up plan for SNF should CIR d/c plan not work.  [name]. [name]  [phone]

## 2012-07-04 NOTE — Progress Notes (Signed)
Utilization review completed. Sonora Catlin, RN, BSN. 

## 2012-07-04 NOTE — Progress Notes (Signed)
Subjective: Tolerated 1 additional PRBC.  Shortness of breath with PT yesterday.  Minimal shortness of breath at rest.  Foley had to be replaced due to recurrent urinary retention.    Objective: Vital signs in last 24 hours: Temp:  [97.7 F (36.5 C)-99 F (37.2 C)] 97.7 F (36.5 C) (10/01 0616) Pulse Rate:  [99-119] 99  (10/01 0616) Resp:  [16-18] 18  (10/01 0616) BP: (93-130)/(46-78) 130/78 mmHg (10/01 0616) SpO2:  [97 %-100 %] 99 % (10/01 0616) Weight change:  Last BM Date: 06/29/12  CBG (last 3)  No results found for this basename: GLUCAP:3 in the last 72 hours  Intake/Output from previous day: 09/30 0701 - 10/01 0700 In: 630 [P.O.:120; I.V.:490; Blood:20] Out: 1550 [Urine:1550] Intake/Output this shift:    General appearance: mild tachypnea Eyes: no scleral icterus Throat: oropharynx moist without erythema Resp: bibasilar crackles Cardio: regular rate and rhythm GI: soft, non-tender; bowel sounds normal; no masses,  no organomegaly Extremities: no clubbing, cyanosis; 1+ left lower extremity edema  Lab Results:  Basename 07/03/12 0545 07/02/12 0550  NA 126* 127*  K 4.3 4.5  CL 99 98  CO2 16* 17*  GLUCOSE 108* 121*  BUN 20 24*  CREATININE 1.27* 1.51*  CALCIUM 8.0* 8.4  MG -- --  PHOS -- --   No results found for this basename: AST:2,ALT:2,ALKPHOS:2,BILITOT:2,PROT:2,ALBUMIN:2 in the last 72 hours  Basename 07/03/12 1918 07/03/12 0545  WBC 8.9 8.4  NEUTROABS 6.5 --  HGB 8.9* 7.8*  HCT 25.8* 22.9*  MCV 89.3 90.9  PLT 224 180   Lab Results  Component Value Date   INR 2.37* 07/03/2012   INR 1.92* 07/02/2012   INR 1.47 07/01/2012   No results found for this basename: CKTOTAL:3,CKMB:3,CKMBINDEX:3,TROPONINI:3 in the last 72 hours No results found for this basename: TSH,T4TOTAL,FREET3,T3FREE,THYROIDAB in the last 72 hours No results found for this basename: VITAMINB12:2,FOLATE:2,FERRITIN:2,TIBC:2,IRON:2,RETICCTPCT:2 in the last 72 hours  Studies/Results: No  results found.   Medications: Scheduled:   . acetaminophen  650 mg Oral Once  . calcium carbonate  1 tablet Oral TID WC  . carvedilol  12.5 mg Oral BID WC  . docusate sodium  100 mg Oral BID  . ferrous sulfate  325 mg Oral TID PC  . flecainide  50 mg Oral BID  . loratadine  10 mg Oral Daily  . multivitamin-lutein  1 capsule Oral Daily  . pantoprazole  40 mg Oral Daily  . vitamin B-6  200 mg Oral BID  . tiotropium  18 mcg Inhalation Daily  . Warfarin - Pharmacist Dosing Inpatient   Does not apply q1800   Continuous:   . sodium chloride 20 mL/hr at 07/03/12 4540    Assessment/Plan: Principal Problem:  1. *Hip fracture, right s/p Right Bipolar Hip arthroplasty (POD #4)- progressing well with rehab. INR therapeutic for DVT prophylaxis. Continue PT/OT Active Problems:  2. Dyspnea- obtain CXR and BNP.  Suspect mild volume overload from IV fluids and PRBCs.  If present will treat with Lasix X 1.  Doubt PE given minimal lapse in anticoagulation. 3. Hyponatremia- pending.  Monitor off diuretics 3. Acute blood loss anemia- s/p 2U PRBC- Repeat CBC pending. 4. Urinary retention-persistent likely related to post-anesthetic effects.  Continue indwelling foley.  Check urinalysis. 5. Coagulopathy- continue Coumadin per pharmacy.  6. Atrial fibrillation/flutter- rate remains elevated despite blood transfusion.  Increase Coreg to 25mg  BID.  Consider Digoxin. 7. Osteoporosis- consider Prolia as outpatient.  8. Hypertension- ARB and Maxzide on hold. Monitor.  9.  Disposition- possible discharge to IR later today if stable though more likely tomorrow to CIR or SNF rehab.   LOS: 5 days   SHAW,W DOUGLAS 07/04/2012, 7:22 AM

## 2012-07-04 NOTE — Progress Notes (Signed)
ANTICOAGULATION CONSULT - COUMADIN  Pharmacy Consult for Coumadin  HPI: 78 y/o female was admitted for femoral neck fracture s/p fall, she is now s/p R hip arthroplasty. She was on Coumadin PTA for afib.  Allergies: Allergies  Allergen Reactions  . Sulfa Antibiotics Other (See Comments)    [name]  . Celebrex (Celecoxib) Swelling  . Clarithromycin Swelling  . Statins Other (See Comments)    Hair fell out  . Vioxx (Rofecoxib) Other (See Comments)    Renal problems  . Penicillins Rash    Height/Weight: Height: 5' 5.75" (167 cm) Weight: 180 lb (81.647 kg) IBW/kg (Calculated) : 58.72   Vitals: Blood pressure 130/78, pulse 99, temperature 97.7 F (36.5 C), temperature source Oral, resp. rate 18, height 5' 5.75" (1.67 m), weight 180 lb (81.647 kg), SpO2 99.00%.  Current active problems: Principal Problem:  *Hip fracture, right Active Problems:  Hyponatremia  Coagulopathy  Atrial fibrillation  Osteoporosis  Hypertension  Acute blood loss anemia  Urinary retention   Medical / Surgical History: Past Medical History  Diagnosis Date  . A-fib   . Hypertension   . High cholesterol   . Macular degeneration    Past Surgical History  Procedure Date  . Hip arthroplasty 06/30/2012    Procedure: ARTHROPLASTY BIPOLAR HIP;  Surgeon: Shelda Pal, MD;  Location: [facility] OR;  Service: Orthopedics;  Laterality: Right;  right hip hemiarthroplasty    Current Labs:  [facility] 07/04/12 0557 07/03/12 1918 07/03/12 0545 07/02/12 0550  HGB 9.1* 8.9* -- --  HCT 26.9* 25.8* 22.9* --  PLT 231 224 180 --  LABPROT 26.0* -- 24.8* 21.2*  INR 2.52* -- 2.37* 1.92*  CREATININE 1.21* -- 1.27* 1.51*   Lab Results  Component Value Date   INR 2.52* 07/04/2012   INR 2.37* 07/03/2012   INR 1.92* 07/02/2012   Estimated Creatinine Clearance: 39.7 ml/min (by C-G formula based on Cr of 1.21).  Pertinent Medication History: Scheduled:    . acetaminophen  650 mg Oral Once  . calcium  carbonate  1 tablet Oral TID WC  . carvedilol  25 mg Oral BID WC  . docusate sodium  100 mg Oral BID  . ferrous sulfate  325 mg Oral TID PC  . flecainide  50 mg Oral BID  . loratadine  10 mg Oral Daily  . multivitamin-lutein  1 capsule Oral Daily  . pantoprazole  40 mg Oral Daily  . vitamin B-6  200 mg Oral BID  . tiotropium  18 mcg Inhalation Daily  . Warfarin - Pharmacist Dosing Inpatient   Does not apply q1800  . DISCONTD: carvedilol  12.5 mg Oral BID WC    Assessment:  Today's INR 2.52 after holding Coumadin dose yesterday.  H/H improved after PRBC's yesterday.  No signs of bleeding complications observed.  Goals:  Target INR of 2-3.  Plan:  Will give Coumadin 1 mg po today.    Daily INR's, CBC.  Moon Budde, Elisha Headland, Pharm. D. 07/04/2012, 9:15 AM

## 2012-07-04 NOTE — Progress Notes (Signed)
Utilization review completed. Blimi Godby, RN, BSN. 

## 2012-07-05 ENCOUNTER — Inpatient Hospital Stay (HOSPITAL_COMMUNITY)
Admission: RE | Admit: 2012-07-05 | Discharge: 2012-07-17 | DRG: 945 | Disposition: A | Discharge status: Skilled Nursing Facility | Payer: Medicare Other | Source: Ambulatory Visit | Attending: Physical Medicine & Rehabilitation | Admitting: Physical Medicine & Rehabilitation

## 2012-07-05 ENCOUNTER — Encounter (HOSPITAL_COMMUNITY): Payer: Self-pay | Admitting: *Deleted

## 2012-07-05 DIAGNOSIS — E8779 Other fluid overload: Secondary | ICD-10-CM

## 2012-07-05 DIAGNOSIS — S72033A Displaced midcervical fracture of unspecified femur, initial encounter for closed fracture: Secondary | ICD-10-CM

## 2012-07-05 DIAGNOSIS — Z5189 Encounter for other specified aftercare: Secondary | ICD-10-CM

## 2012-07-05 DIAGNOSIS — I4891 Unspecified atrial fibrillation: Secondary | ICD-10-CM

## 2012-07-05 DIAGNOSIS — Y92009 Unspecified place in unspecified non-institutional (private) residence as the place of occurrence of the external cause: Secondary | ICD-10-CM

## 2012-07-05 DIAGNOSIS — N390 Urinary tract infection, site not specified: Secondary | ICD-10-CM

## 2012-07-05 DIAGNOSIS — J449 Chronic obstructive pulmonary disease, unspecified: Secondary | ICD-10-CM | POA: Diagnosis present

## 2012-07-05 DIAGNOSIS — I10 Essential (primary) hypertension: Secondary | ICD-10-CM

## 2012-07-05 DIAGNOSIS — Z882 Allergy status to sulfonamides status: Secondary | ICD-10-CM

## 2012-07-05 DIAGNOSIS — E7800 Pure hypercholesterolemia, unspecified: Secondary | ICD-10-CM

## 2012-07-05 DIAGNOSIS — W19XXXA Unspecified fall, initial encounter: Secondary | ICD-10-CM

## 2012-07-05 DIAGNOSIS — Z888 Allergy status to other drugs, medicaments and biological substances status: Secondary | ICD-10-CM

## 2012-07-05 DIAGNOSIS — I509 Heart failure, unspecified: Secondary | ICD-10-CM

## 2012-07-05 DIAGNOSIS — D62 Acute posthemorrhagic anemia: Secondary | ICD-10-CM

## 2012-07-05 DIAGNOSIS — E871 Hypo-osmolality and hyponatremia: Secondary | ICD-10-CM

## 2012-07-05 DIAGNOSIS — Z96649 Presence of unspecified artificial hip joint: Secondary | ICD-10-CM

## 2012-07-05 DIAGNOSIS — R296 Repeated falls: Secondary | ICD-10-CM

## 2012-07-05 DIAGNOSIS — K5900 Constipation, unspecified: Secondary | ICD-10-CM

## 2012-07-05 DIAGNOSIS — R339 Retention of urine, unspecified: Secondary | ICD-10-CM

## 2012-07-05 DIAGNOSIS — Z880 Allergy status to penicillin: Secondary | ICD-10-CM

## 2012-07-05 DIAGNOSIS — S72001A Fracture of unspecified part of neck of right femur, initial encounter for closed fracture: Secondary | ICD-10-CM | POA: Diagnosis present

## 2012-07-05 DIAGNOSIS — Z7901 Long term (current) use of anticoagulants: Secondary | ICD-10-CM

## 2012-07-05 DIAGNOSIS — H3530 Unspecified macular degeneration: Secondary | ICD-10-CM

## 2012-07-05 DIAGNOSIS — S72009A Fracture of unspecified part of neck of unspecified femur, initial encounter for closed fracture: Secondary | ICD-10-CM

## 2012-07-05 DIAGNOSIS — Z79899 Other long term (current) drug therapy: Secondary | ICD-10-CM

## 2012-07-05 LAB — BASIC METABOLIC PANEL
BUN: 19 mg/dL (ref 6–23)
Chloride: 98 mEq/L (ref 96–112)
Creatinine, Ser: 1.2 mg/dL — ABNORMAL HIGH (ref 0.50–1.10)
GFR calc Af Amer: 48 mL/min — ABNORMAL LOW (ref >90)
Glucose, Bld: 119 mg/dL — ABNORMAL HIGH (ref 70–99)

## 2012-07-05 LAB — CBC
HCT: 27 % — ABNORMAL LOW (ref 36.0–46.0)
MCH: 30.8 pg (ref 26.0–34.0)
MCHC: 34.1 g/dL (ref 30.0–36.0)
MCV: 90.3 fL (ref 78.0–100.0)
RDW: 14.3 % (ref 11.5–15.5)

## 2012-07-05 LAB — PROTIME-INR: INR: 2.82 — ABNORMAL HIGH (ref 0.00–1.49)

## 2012-07-05 MED ORDER — TRAZODONE HCL 50 MG PO TABS
25.0000 mg | ORAL_TABLET | Freq: Every evening | ORAL | Status: DC | PRN
Start: 2012-07-05 — End: 2012-07-17
  Administered 2012-07-06 – 2012-07-15 (×2): 50 mg via ORAL
  Filled 2012-07-05 (×2): qty 1

## 2012-07-05 MED ORDER — ALUM & MAG HYDROXIDE-SIMETH 200-200-20 MG/5ML PO SUSP: 30.0000 mL | Freq: Four times a day (QID) | ORAL | Status: DC | PRN

## 2012-07-05 MED ORDER — BISACODYL 10 MG RE SUPP: 10.0000 mg | Freq: Every day | RECTAL | Status: DC | PRN

## 2012-07-05 MED ORDER — CARVEDILOL 25 MG PO TABS
25.0000 mg | ORAL_TABLET | Freq: Two times a day (BID) | ORAL | Status: DC
  Administered 2012-07-05 – 2012-07-17 (×23): 25 mg via ORAL
  Filled 2012-07-05 (×26): qty 1

## 2012-07-05 MED ORDER — PROSIGHT PO TABS
1.0000 | ORAL_TABLET | Freq: Every day | ORAL | Status: DC
  Administered 2012-07-06 – 2012-07-17 (×12): 1 via ORAL
  Filled 2012-07-05 (×13): qty 1

## 2012-07-05 MED ORDER — FERROUS SULFATE 325 (65 FE) MG PO TABS
325.0000 mg | ORAL_TABLET | Freq: Three times a day (TID) | ORAL | Status: DC
  Administered 2012-07-05 – 2012-07-17 (×36): 325 mg via ORAL
  Filled 2012-07-05 (×39): qty 1

## 2012-07-05 MED ORDER — WARFARIN - PHARMACIST DOSING INPATIENT
Freq: Every day | Status: DC
  Administered 2012-07-08: 18:00:00

## 2012-07-05 MED ORDER — HYDROCODONE-ACETAMINOPHEN 5-325 MG PO TABS
1.0000 | ORAL_TABLET | ORAL | Status: DC | PRN
  Administered 2012-07-05: 2 via ORAL
  Administered 2012-07-06: 1 via ORAL
  Administered 2012-07-07 – 2012-07-08 (×2): 2 via ORAL
  Administered 2012-07-09 (×2): 1 via ORAL
  Administered 2012-07-10 – 2012-07-16 (×7): 2 via ORAL
  Filled 2012-07-05: qty 2
  Filled 2012-07-05: qty 1
  Filled 2012-07-05 (×11): qty 2

## 2012-07-05 MED ORDER — PHENOL 1.4 % MT LIQD
1.0000 | OROMUCOSAL | Status: DC | PRN
  Filled 2012-07-05: qty 177

## 2012-07-05 MED ORDER — ACETAMINOPHEN 325 MG PO TABS
325.0000 mg | ORAL_TABLET | ORAL | Status: DC | PRN
  Administered 2012-07-11: 650 mg via ORAL
  Administered 2012-07-12 – 2012-07-15 (×2): 325 mg via ORAL
  Filled 2012-07-05: qty 1
  Filled 2012-07-05 (×2): qty 2
  Filled 2012-07-05: qty 1

## 2012-07-05 MED ORDER — LORATADINE 10 MG PO TABS
10.0000 mg | ORAL_TABLET | Freq: Every day | ORAL | Status: DC
  Administered 2012-07-06 – 2012-07-17 (×10): 10 mg via ORAL
  Filled 2012-07-05 (×14): qty 1

## 2012-07-05 MED ORDER — POLYETHYLENE GLYCOL 3350 17 G PO PACK
17.0000 g | PACK | Freq: Every day | ORAL | Status: DC | PRN
  Filled 2012-07-05: qty 1

## 2012-07-05 MED ORDER — PROCHLORPERAZINE EDISYLATE 5 MG/ML IJ SOLN
5.0000 mg | Freq: Four times a day (QID) | INTRAMUSCULAR | Status: DC | PRN
  Filled 2012-07-05: qty 2

## 2012-07-05 MED ORDER — PROCHLORPERAZINE MALEATE 5 MG PO TABS
5.0000 mg | ORAL_TABLET | Freq: Four times a day (QID) | ORAL | Status: DC | PRN
Start: 2012-07-05 — End: 2012-07-17
  Filled 2012-07-05: qty 2

## 2012-07-05 MED ORDER — OCUVITE-LUTEIN PO CAPS
1.0000 | ORAL_CAPSULE | Freq: Every day | ORAL | Status: DC
  Filled 2012-07-05: qty 1

## 2012-07-05 MED ORDER — DOCUSATE SODIUM 100 MG PO CAPS
100.0000 mg | ORAL_CAPSULE | Freq: Two times a day (BID) | ORAL | Status: DC
  Administered 2012-07-05 – 2012-07-17 (×24): 100 mg via ORAL
  Filled 2012-07-05 (×29): qty 1

## 2012-07-05 MED ORDER — ALUM & MAG HYDROXIDE-SIMETH 200-200-20 MG/5ML PO SUSP: 30.0000 mL | ORAL | Status: DC | PRN

## 2012-07-05 MED ORDER — DIGOXIN 125 MCG PO TABS
0.1250 mg | ORAL_TABLET | Freq: Every day | ORAL | Status: DC
  Administered 2012-07-05: 0.125 mg via ORAL
  Filled 2012-07-05: qty 1

## 2012-07-05 MED ORDER — MENTHOL 3 MG MT LOZG
1.0000 | LOZENGE | OROMUCOSAL | Status: DC | PRN
  Filled 2012-07-05: qty 9

## 2012-07-05 MED ORDER — FLEET ENEMA 7-19 GM/118ML RE ENEM
1.0000 | ENEMA | Freq: Once | RECTAL | Status: AC | PRN
  Administered 2012-07-05: 1 via RECTAL
  Filled 2012-07-05: qty 1

## 2012-07-05 MED ORDER — FLECAINIDE ACETATE 50 MG PO TABS
50.0000 mg | ORAL_TABLET | Freq: Two times a day (BID) | ORAL | Status: DC
  Administered 2012-07-05 – 2012-07-17 (×24): 50 mg via ORAL
  Filled 2012-07-05 (×27): qty 1

## 2012-07-05 MED ORDER — PROCHLORPERAZINE 25 MG RE SUPP
12.5000 mg | Freq: Four times a day (QID) | RECTAL | Status: DC | PRN
  Filled 2012-07-05: qty 1

## 2012-07-05 MED ORDER — CALCIUM CARBONATE 1250 (500 CA) MG PO TABS
1.0000 | ORAL_TABLET | Freq: Three times a day (TID) | ORAL | Status: DC
  Administered 2012-07-05 – 2012-07-17 (×36): 500 mg via ORAL
  Filled 2012-07-05 (×38): qty 1

## 2012-07-05 MED ORDER — CIPROFLOXACIN HCL 500 MG PO TABS
500.0000 mg | ORAL_TABLET | Freq: Two times a day (BID) | ORAL | Status: DC
  Administered 2012-07-05 – 2012-07-06 (×3): 500 mg via ORAL
  Filled 2012-07-05 (×7): qty 1

## 2012-07-05 MED ORDER — GUAIFENESIN-DM 100-10 MG/5ML PO SYRP: 5.0000 mL | ORAL_SOLUTION | Freq: Four times a day (QID) | ORAL | Status: DC | PRN

## 2012-07-05 MED ORDER — PANTOPRAZOLE SODIUM 40 MG PO TBEC
40.0000 mg | DELAYED_RELEASE_TABLET | Freq: Every day | ORAL | Status: DC
  Administered 2012-07-06 – 2012-07-17 (×12): 40 mg via ORAL
  Filled 2012-07-05 (×12): qty 1

## 2012-07-05 MED ORDER — TIOTROPIUM BROMIDE MONOHYDRATE 18 MCG IN CAPS
18.0000 ug | ORAL_CAPSULE | Freq: Every day | RESPIRATORY_TRACT | Status: DC
  Administered 2012-07-06 – 2012-07-17 (×12): 18 ug via RESPIRATORY_TRACT
  Filled 2012-07-05 (×3): qty 5

## 2012-07-05 MED ORDER — INFLUENZA VIRUS VACC SPLIT PF IM SUSP
0.5000 mL | INTRAMUSCULAR | Status: AC
  Administered 2012-07-06: 0.5 mL via INTRAMUSCULAR
  Filled 2012-07-05: qty 0.5

## 2012-07-05 MED ORDER — DIPHENHYDRAMINE HCL 12.5 MG/5ML PO ELIX
12.5000 mg | ORAL_SOLUTION | Freq: Four times a day (QID) | ORAL | Status: DC | PRN
Start: 2012-07-05 — End: 2012-07-17
  Filled 2012-07-05: qty 10

## 2012-07-05 MED ORDER — DIGOXIN 125 MCG PO TABS
0.1250 mg | ORAL_TABLET | Freq: Every day | ORAL | Status: DC
  Administered 2012-07-06 – 2012-07-17 (×12): 0.125 mg via ORAL
  Filled 2012-07-05 (×13): qty 1

## 2012-07-05 MED ORDER — METHOCARBAMOL 500 MG PO TABS
500.0000 mg | ORAL_TABLET | Freq: Four times a day (QID) | ORAL | Status: DC | PRN
  Administered 2012-07-05 – 2012-07-16 (×12): 500 mg via ORAL
  Filled 2012-07-05 (×12): qty 1
  Filled 2012-07-05: qty 2

## 2012-07-05 MED ORDER — VITAMIN B-6 100 MG PO TABS
200.0000 mg | ORAL_TABLET | Freq: Two times a day (BID) | ORAL | Status: DC
  Administered 2012-07-07: 200 mg via ORAL
  Filled 2012-07-05 (×13): qty 2

## 2012-07-05 NOTE — Discharge Summary (Signed)
DISCHARGE SUMMARY  Madeline Serrano  MR#: [id number]  DOB:[birth year]  Date of Admission: 06/29/2012 Date of Discharge: 07/05/2012  Attending Physician: Kari Baars  Patient's PCP: Kari Baars  Consults:Treatment Team:  Loanne Drilling, MD/Matthew Rosalia Hammers, MD- Orthopaedics  Discharge Diagnoses: Principal Problem:  *Hip fracture, right Active Problems:  Hyponatremia  Coagulopathy  Atrial fibrillation/flutter with Rapid Ventricular Rate  Osteoporosis  Hypertension  Acute blood loss anemia  Urinary retention  COPD (chronic obstructive pulmonary disease)  Urinary tract infection  Dyspnea on exertion  Past Medical History:  COPD- PFTs in 2/13- moderate (FEV1/FVC 60%, FEV1 1.2L)  HTN (renal artery study normal 3/09)  history of Acute renal failure  Anemia  paroxysmal Atrial fib. on anticoagulation  Hyperlipidemia  IFG  Osteoporosis 7/10 (t-2.6 Left femoral neck)- left foot stress fracture (11/12), remote left humerus fracture  Macular degeneration   Past Surgical History:  1984 bladder prolapse  1999 knee arthroscopic  2001 knee arth.  2001 cataract OD  1967 (R) #2, 3 finger grafts   Discharge Meds:   . bumetanide  0.5 mg Intravenous Once  . calcium carbonate  1 tablet Oral TID WC  . carvedilol  25 mg Oral BID WC  . ciprofloxacin  500 mg Oral BID  . ciprofloxacin  500 mg Oral Once  . digoxin  0.125 mg Oral Daily  . docusate sodium  100 mg Oral BID  . ferrous sulfate  325 mg Oral TID PC  . flecainide  50 mg Oral BID  . loratadine  10 mg Oral Daily  . multivitamin-lutein  1 capsule Oral Daily  . pantoprazole  40 mg Oral Daily  . vitamin B-6  200 mg Oral BID  . tiotropium  18 mcg Inhalation Daily  . warfarin  1 mg Oral ONCE-1800  . Warfarin - Pharmacist Dosing Inpatient   Does not apply q1800  . DISCONTD: carvedilol  12.5 mg Oral BID WC   Continuous Infusions:   . DISCONTD: sodium chloride 20 mL/hr at 07/03/12 0826   PRN Meds:.acetaminophen, acetaminophen,  alum & mag hydroxide-simeth, diphenhydrAMINE, HYDROcodone-acetaminophen, HYDROmorphone (DILAUDID) injection, menthol-cetylpyridinium, meperidine (DEMEROL) injection, metoCLOPramide (REGLAN) injection, metoCLOPramide, ondansetron (ZOFRAN) IV, ondansetron, phenol, polyethylene glycol, promethazine  Hospital Procedures: Dg Chest 1 View 06/29/2012  *RADIOLOGY REPORT*  Clinical Data: Fall.  Right hip pain.  CHEST - 1 VIEW  Comparison: 03/26/2011.  Findings:  Cardiopericardial silhouette within normal limits. Mediastinal contours normal. Trachea midline.  No airspace disease or effusion.  Low volumes are present.  Stable prominence of left a perihilar pulmonary artery.  Multiple loose bodies are present in the left axillary pouch with left right humeral osteoarthritis.  IMPRESSION: Low volume chest.  No active cardiopulmonary disease.   Original Report Authenticated By: Andreas Newport, M.D.    Dg Chest 2 View 07/04/2012  *RADIOLOGY REPORT*  Clinical Data: Shortness of breath  CHEST - 2 VIEW  Comparison: 06/29/2012  Findings: Normal heart size and vascularity.  Low lung volumes evident.  Negative for CHF or pneumonia.  No collapse, consolidation, effusion or pneumothorax.  Degenerative changes of the left shoulder.  No significant interval change.  IMPRESSION: Low volume exam.  No acute process   Original Report Authenticated By: Judie Petit. Ruel Favors, M.D.    Dg Hip Complete Right 06/29/2012  *RADIOLOGY REPORT*  Clinical Data: Fall.  Right hip pain.  RIGHT HIP - COMPLETE 2+ VIEW  Comparison: None.  Findings: Bilateral SI joint degenerative disease.  Partially visualized lumbar spondylosis.  The obturator rings appear intact.  There is abnormal lucency in the right greater trochanter.  The right hip is externally rotated.  There is angulation in the lateral margin of the femoral head. Additionally, there is irregular lucency through the greater trochanter.   On the frontal view of the pelvis, the fracture is more evident,  particularly with step-off deformity along the medial inferior femoral head.  IMPRESSION: Right proximal femur fracture.  The fracture is probably sub capital but may have intertrochanteric extension as well.  Probable mild impaction.  Fracture is suboptimally visualized due to external rotation of the hip.   Original Report Authenticated By: Andreas Newport, M.D.    Dg Pelvis Portable 06/30/2012  *RADIOLOGY REPORT*  Clinical Data: Right hip replacement  PORTABLE PELVIS  Comparison: CT 09/22/2006 the right  Findings: Right hip arthroplasty is intact without evidence of dislocation or fracture.  There is subcutaneous gas along the left flank consistent recent surgery.  IMPRESSION: Note complication following right hip arthroplasty.   Original Report Authenticated By: Genevive Bi, M.D.    Right Hemiarthroplasty (06/30/12)- Dr. Charlann Boxer  Transfusion 2 units packed red blood cells (9/29 and 9/30)  History of Present Illness: Madeline Serrano is an 78 year old white female with a history of paroxysmal atrial fibrillation on anticoagulation, moderate COPD, and hypertension who presented to the emergency department with a complaint of right groin pain following a fall. Headaches are seen the patient yesterday in office do to left proximal arm pain, felt to be tendinitis. She was otherwise in her usual state of health. Last evening, she decided to take an extra amount of Valium (about a half of a pill) to help her sleep. She awoke and walked to the bathroom. Upon return to her bed she tripped on the carpet and fell onto her right hip. She developed acute onset right groin pain and was bleeding significantly from her right foot. She called her son and was brought to the emergency department where she was found have a right subcapital femur fracture. In addition, her INR was 6.0 and sodium was 123. Her last INR was less than a month ago and was 3.0. She's not had any changes in her dose. She has had some decreased by mouth  intake recently. In fact, yesterday she states that she only had grapes and a small bowl of soup after breakfast. She's not been drinking a lot of fluid. She continues to take her Maxzide and reports that sometimes she eats salty crackers to try to raise her sodium. Her last sodium value was 131 about 2 months ago. She denies any lightheadedness, dizziness, syncope, or near syncope. She denies any recent chest pain. No recent bleeding issues. Her pain is controlled after receiving fentanyl and Dilantin in the emergency department   Hospital Course: Madeline Serrano was admitted to a telemetry bed. Given her supratherapeutic INR she was given vitamin K 5 mg IV x1 with resolution of her coagulopathy.  She was evaluated by orthopedics- once her INR was below 1.5, Dr. Charlann Boxer performed a right hemiarthroplasty on 9/27. She tolerated this procedure well. SCDs were placed for DVT prophylaxis throughout her hospital course and Coumadin was resumed postoperatively with a return to therapeutic level within 48 hours.  Postoperative course was complicated by acute blood loss anemia requiring transfusion, A. fib/flutter with rapid ventricular response,  urinary tract infection, dyspnea on exertion with an elevated BNP, and urinary retention requiring indwelling Foley catheter, and resolving hyponatremia.    For her anemia, she received 2 units packed red blood  cells over a two-day period. With this, her hemoglobin stabilized at 9.1 on the day prior to discharge. Repeat CBC is pending at this time.  Despite transfusion, she remained tachycardic with A. fib/flutter on telemetry with rates in the 100s-120s.  She was continued on her Fleicanide. Her Coreg dose was increased with slight improvement prior to discharge. However, given persistently elevated rates digoxin will be added. She seems to be tolerating this well at rest but she is dyspnea with exertion. If her rate does not continue to improve, cardiology consult with Dr.  Allyson Sabal would be recommended. Postoperatively, she was restarted on Coumadin which has been managed by pharmacy protocol without any issues.  With her A. fib she continues to have some dyspnea on exertion. A BNP was obtained on the day prior to discharge that was elevated at 11,110. Chest x-ray showed no significant pulmonary edema.  She was given a dose of Bumex for this though this is likely related to her tachycardia. Her O2 sats have remained near 100% at rest.  She does have underlying COPD and has been continued on her home Spiriva.  She was noted to have significant urinary retention postoperatively. She underwent in and out catheterization x3 without improvement. This is felt to be secondary to post anesthetic effect. At this time, she has an indwelling Foley catheter. Another voiding trial should be attempted within the next 2-3 days.  With limiting her diuretic therapy her sodium has improved to 129 on the day prior to discharge. This is a difficult balance given the dyspnea on exertion, elevated BNP but she does appear to be fairly euvolemic.  I recommend withholding diuretic therapy and she has increased shortness of breath or peripheral edema do to her hyponatremia.   The patient has been progressing with physical therapy. She is very motivated to return home independently. She is now stable for discharge to [facility] for acute rehabilitation.     Day of Discharge Exam BP 98/57  Pulse 121  Temp 98.9 F (37.2 C) (Oral)  Resp 18  Ht 5' 5.75" (1.67 m)  Wt 81.647 kg (180 lb)  BMI 29.28 kg/m2  SpO2 99%  Physical Exam: General appearance: alert, no distress and Mild tachypnea with movement Eyes: no scleral icterus Throat: oropharynx moist without erythema Resp: Minimal bibasilar crackles Cardio: irregularly irregular rhythm GI: soft, non-tender; bowel sounds normal; no masses,  no organomegaly Extremities: no clubbing, cyanosis; trace right lower extremity edema;  right hip incision with minimal ecchymosis  Discharge Labs:  [facility] 07/04/12 0557 07/03/12 0545  NA 129* 126*  K 4.2 4.3  CL 99 99  CO2 18* 16*  GLUCOSE 104* 108*  BUN 17 20  CREATININE 1.21* 1.27*  CALCIUM 8.4 8.0*  MG -- --  PHOS -- --    Basename 07/04/12 0557 07/03/12 1918  WBC 8.3 8.9  NEUTROABS -- 6.5  HGB 9.1* 8.9*  HCT 26.9* 25.8*  MCV 89.7 89.3  PLT 231 224   Lab Results  Component Value Date   INR 2.52* 07/04/2012   INR 2.37* 07/03/2012   INR 1.92* 07/02/2012   BNP    Component Value Date/Time   PROBNP 11110.0* 07/04/2012 0934    Discharge instructions: Continue physical therapy/occupational therapy through inpatient rehabilitation. Continue indwelling Foley catheter for 2-3 days, then attempt voiding trial. Continue Coumadin per pharmacy protocol.  Consider cardiology consult if her tachycardia is unimproved with addition of digoxin.  Disposition: To [facility]  Follow-up Appts: Follow-up with  Dr. Clelia Croft at [facility] in 1 week after discharge from Rehab.  Call for appointment. Follow-up with Dr. Charlann Boxer in 2 weeks - [facility]  Condition on Discharge: Stable  Tests Needing Follow-up: CBC, BMET, PT/INR, BNP are all pending at the time of discharge.  She will need followup DEXA and treatment of her osteoporosis as an outpatient.  Signed: SHAW,W DOUGLAS 07/05/2012, 6:26 AM

## 2012-07-05 NOTE — Progress Notes (Signed)
ANTICOAGULATION CONSULT NOTE - Initial Consult  Pharmacy Consult for coumadin Indication: atrial fibrillation  Allergies  Allergen Reactions  . Sulfa Antibiotics Other (See Comments)    [name]  . Celebrex (Celecoxib) Swelling  . Clarithromycin Swelling  . Statins Other (See Comments)    Hair fell out  . Vioxx (Rofecoxib) Other (See Comments)    Renal problems  . Penicillins Rash    Patient Measurements: Wt=81.6kg  Vital Signs: Temp: 98.1 F (36.7 C) (10/02 1600) Temp src: Oral (10/02 1600) BP: 123/64 mmHg (10/02 0703) Pulse Rate: 102  (10/02 0703)  Labs:  [facility] Phil[facility] 07/05/12 4098 07/04/12 0557 07/03/12 1918 07/03/12 0545  HGB 9.2* 9.1* -- --  HCT 27.0* 26.9* 25.8* --  PLT 250 231 224 --  APTT -- -- -- --  LABPROT 28.2* 26.0* -- 24.8*  INR 2.82* 2.52* -- 2.37*  HEPARINUNFRC -- -- -- --  CREATININE 1.20* 1.21* -- 1.27*  CKTOTAL -- -- -- --  CKMB -- -- -- --  TROPONINI -- -- -- --    The CrCl is unknown because both a height and weight (above a minimum accepted value) are required for this calculation.   Medical History: Past Medical History  Diagnosis Date  . A-fib   . Hypertension   . High cholesterol   . Macular degeneration     Medications:  Prescriptions prior to admission  Medication Sig Dispense Refill  . calcium carbonate (OS-CAL) 600 MG TABS Take 600 mg by mouth 3 (three) times daily with meals.      . carvedilol (COREG) 12.5 MG tablet Take 18.75 mg by mouth 2 (two) times daily with a meal.       . estrogen, conjugated,-medroxyprogesterone (PREMPRO) 0.3-1.5 MG per tablet Take 1 tablet by mouth daily. 5 days a week Does not take on Tuesday and Friday      . fexofenadine (ALLEGRA) 180 MG tablet Take 180 mg by mouth daily as needed. For allergies       . fish oil-omega-3 fatty acids 1000 MG capsule Take 1 g by mouth daily.       . flecainide (TAMBOCOR) 50 MG tablet Take 50 mg by mouth 2 (two) times daily.        . lansoprazole (PREVACID) 15  MG capsule Take 15 mg by mouth every other day.      . Multiple Vitamins-Minerals (ICAPS) TABS Take 1 tablet by mouth daily.      Marland Kitchen olmesartan (BENICAR) 20 MG tablet Take 20 mg by mouth daily.      Marland Kitchen pyridoxine (B-6) 200 MG tablet Take 200 mg by mouth 2 (two) times daily.       . rosuvastatin (CRESTOR) 10 MG tablet Take 5 mg by mouth every Monday, Wednesday, and Friday.       . tiotropium (SPIRIVA) 18 MCG inhalation capsule Place 18 mcg into inhaler and inhale daily.      Marland Kitchen triamterene-hydrochlorothiazide (MAXZIDE) 75-50 MG per tablet Take 0.5 tablets by mouth daily.      Marland Kitchen warfarin (COUMADIN) 4 MG tablet Take 2-4 mg by mouth daily. Takes 1 tablet on Monday, and Friday Takes 1/2 tablet the rest of the week        Assessment: 78 yo female s/p R hip arthroplastyand also w/ afib on coumadin PTA now transferred to rehab INR is at goal (2.82) and home dose is 2mg  STWTS and 4mg  MF. INR has been trending up and patient noted on cipro.  Goal of Therapy:  INR 2-3 Monitor platelets by anticoagulation protocol: Yes   Plan:  -Per prior plans hold coumadin tonight -Coumadin discharge education has been completed.  Harland German, Pharm D 07/05/2012 4:37 PM

## 2012-07-05 NOTE — Progress Notes (Signed)
I met with patient at bedside. Discussed inpt rehab venue for her rehab. She is in agreement and bed is available today. We will plan admit today. Please call for any questions. [phone].

## 2012-07-05 NOTE — Progress Notes (Signed)
PT Cancellation Note  Treatment cancelled today due to patient's refusal to participate.  Pt stated she is being transported to in-patient rehab and wants to wait for transport.  Rand Boller 07/05/2012, 1:04 PM

## 2012-07-05 NOTE — Progress Notes (Signed)
Agree with PT cancellation note.  Maddex Garlitz, PT DPT [phone]  

## 2012-07-05 NOTE — PMR Pre-admission (Signed)
PMR Admission Coordinator Pre-Admission Assessment  Patient: Madeline Serrano is an 78 y.o., female MRN: [id number] DOB: 1930/08/08 Height: 5' 5.75" (167 cm) Weight: 81.647 kg (180 lb)  Insurance Information HMO:     PPO:      PCP:      IPA:      80/20: yes     OTHER: no HMO PRIMARY: Medicare a and b      Policy#: [id number]      Subscriber: pt Benefits:  Phone #: vision share     Name: 07/05/12 Eff. Date: 01/02/97     Deduct: $1184      Out of Pocket Max: none      Life Max: none CIR: 100%      SNF: 20 full days LBD 09/25/2010 Outpatient: 80%     Co-Pay: 20% Home Health: 100%      Co-Pay: 20% DME: 80%     Co-Pay: 20% Providers: pt choice  SECONDARY: [employer] of [place] mcr supplement      Policy#: [id number]      Subscriber: pt No authorization needed with medicare primary  Medicaid Application Date:       Case Manager:  Disability Application Date:       Case Worker:   Emergency Contact Information Contact Information    Name Relation Home Work [place] Son [phone]   Shaquna, Geigle   [phone]     Current Medical History  Patient Admitting Diagnosis: Right Femoral Neck Fx  History of Present Illness: 79 y.o. female with history of HTN, Atrial Fib with chronic coumadin who fell night prior to admission on 06/29/12 with complaints of right hip pain. Workup revealed nondisplaced right subcapital femoral neck fracture and sodium 123 at admission. INR was 6.0.Coumadin reversed and patient underwent right hip hemi by Dr Charlann Boxer on 06/30/12. Post op PWB RLE and coumadin resumed. Did develop ABLA and was transfused with 1 unit PRBC for Hgb @ 7.8. Has had problems voiding and cath for 850 cc yesterday. Therapies ongoing and patient needs cues for sequencing, as well as maintaining PWB status.  Postoperatively patient with A. Fib/flutter with rapid ventricular response, UTI, dyspnea with an elevated BNP, urinary retention requiring indwelling foley catheter,  and resolving hyponatremia.    Heart rates in the 100s-120s. She was continued on her Fleicanide. Her Coreg dose was increased with slight improvement prior to discharge. However, given persistently elevated rates, digoxin was added. She seemed to be tolerating this well at rest but she has dyspnea with exertion. If her rate does not continue to improve, cardiology consult with Dr. Allyson Sabal would be recommended per Dr. Clelia Croft. Postoperatively, she was restarted on Coumadin which has been managed by pharmacy protocol. With her A. fib she continues to have some dyspnea on exertion. A BNP was obtained on the day prior to discharge that was elevated at 11,110. Chest x-ray showed no significant pulmonary edema. She was given a dose of Bumex for this though this is likely related to her tachycardia per MD. Her O2 sats have remained near 100% at rest. She does have underlying COPD and has been continued on her home Spiriva.  With limiting her diuretic therapy her sodium has improved to 129 on the day prior to discharge. This was felt to be a  difficult balance given the dyspnea on exertion, elevated BNP but she did appear to be fairly euvolemic per MD upon discharge.  Past Medical History  Past Medical History  Diagnosis  Date  . A-fib   . Hypertension   . High cholesterol   . Macular degeneration     Family History  family history is not on file.  Prior Rehab/Hospitalizations: none  Current Medications  Current facility-administered medications:acetaminophen (TYLENOL) suppository 650 mg, 650 mg, Rectal, Q6H PRN, W Buren Kos, MD;  acetaminophen (TYLENOL) tablet 650 mg, 650 mg, Oral, Q6H PRN, Kari Baars, MD;  alum & mag hydroxide-simeth (MAALOX/MYLANTA) 200-200-20 MG/5ML suspension 30 mL, 30 mL, Oral, Q6H PRN, Kari Baars, MD;  bumetanide [facility]) injection 0.5 mg, 0.5 mg, Intravenous, Once, W Buren Kos, MD, 0.5 mg at 07/04/12 1505 calcium carbonate (OS-CAL - dosed in mg of elemental calcium)  tablet 500 mg of elemental calcium, 1 tablet, Oral, TID WC, Kari Baars, MD, 500 mg of elemental calcium at 07/05/12 0838;  carvedilol (COREG) tablet 25 mg, 25 mg, Oral, BID WC, W Douglas Shaw, MD, 25 mg at 07/05/12 1308;  ciprofloxacin (CIPRO) tablet 500 mg, 500 mg, Oral, BID, W Buren Kos, MD, 500 mg at 07/05/12 6578 ciprofloxacin (CIPRO) tablet 500 mg, 500 mg, Oral, Once, W Buren Kos, MD;  digoxin Margit Banda) tablet 0.125 mg, 0.125 mg, Oral, Daily, W Buren Kos, MD, 0.125 mg at 07/05/12 4696;  diphenhydrAMINE (BENADRYL) capsule 25 mg, 25 mg, Oral, Q6H PRN, Genelle Gather Babish, PA;  docusate sodium (COLACE) capsule 100 mg, 100 mg, Oral, BID, W Buren Kos, MD, 100 mg at 07/05/12 2952 ferrous sulfate tablet 325 mg, 325 mg, Oral, TID PC, Genelle Gather [place], [place], 325 mg at 07/05/12 8413;  flecainide (TAMBOCOR) tablet 50 mg, 50 mg, Oral, BID, W Buren Kos, MD, 50 mg at 07/05/12 2440;  HYDROcodone-acetaminophen (NORCO/VICODIN) 5-325 MG per tablet 1-2 tablet, 1-2 tablet, Oral, Q4H PRN, Kari Baars, MD, 2 tablet at 07/04/12 2109 HYDROmorphone (DILAUDID) injection 0.25-0.5 mg, 0.25-0.5 mg, Intravenous, Q5 min PRN, Josepha Pigg, MD, 0.5 mg at 06/30/12 2050;  loratadine (CLARITIN) tablet 10 mg, 10 mg, Oral, Daily, W Buren Kos, MD, 10 mg at 07/03/12 1000;  menthol-cetylpyridinium (CEPACOL) lozenge 3 mg, 1 lozenge, Oral, PRN, Genelle Gather Babish, PA;  meperidine (DEMEROL) injection 6.25-12.5 mg, 6.25-12.5 mg, Intravenous, Q5 min PRN, Josepha Pigg, MD metoCLOPramide (REGLAN) injection 5-10 mg, 5-10 mg, Intravenous, Q8H PRN, Genelle Gather Babish, PA;  metoCLOPramide (REGLAN) tablet 5-10 mg, 5-10 mg, Oral, Q8H PRN, Genelle Gather Babish, PA;  multivitamin-lutein (OCUVITE-LUTEIN) capsule 1 capsule, 1 capsule, Oral, Daily, W Buren Kos, MD, 1 capsule at 07/05/12 0942;  ondansetron (ZOFRAN) injection 4 mg, 4 mg, Intravenous, Q6H PRN, Genelle Gather Babish, PA ondansetron [facility]) tablet  4 mg, 4 mg, Oral, Q6H PRN, Genelle Gather Babish, PA;  pantoprazole (PROTONIX) EC tablet 40 mg, 40 mg, Oral, Daily, W Buren Kos, MD, 40 mg at 07/05/12 0943;  phenol (CHLORASEPTIC) mouth spray 1 spray, 1 spray, Mouth/Throat, PRN, Genelle Gather Babish, PA;  polyethylene glycol (MIRALAX / GLYCOLAX) packet 17 g, 17 g, Oral, Daily PRN, W Douglas Shaw, MD promethazine [facility]) injection 6.25-12.5 mg, 6.25-12.5 mg, Intravenous, Q15 min PRN, Josepha Pigg, MD, 6.25 mg at 06/30/12 2210;  pyridOXINE (VITAMIN B-6) tablet 200 mg, 200 mg, Oral, BID, W Buren Kos, MD, 200 mg at 07/05/12 1027;  tiotropium [facility]) inhalation capsule 18 mcg, 18 mcg, Inhalation, Daily, Kari Baars, MD, 18 mcg at 07/05/12 2536 warfarin (COUMADIN) tablet 1 mg, 1 mg, Oral, ONCE-1800, W Buren Kos, MD, 1 mg at 07/04/12 1657;  Warfarin - Pharmacist Dosing Inpatient, , Does not apply, q1800, Meera  Concha Se, PHARMD  Patients Current Diet: Cardiac  Precautions / Restrictions Precautions Precautions: Posterior Hip Precaution Comments: Pt able to recall 3/3 posterior hip precautions. 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 reconstruction surgeries with no finger joints limits grip to r hand  Current Functional Level Cognition  Arousal/Alertness: Awake/alert Overall Cognitive Status: Appears within functional limits for tasks assessed/performed Orientation Level:  Oriented X4    Extremity Assessment (includes Sensation/Coordination)  RUE ROM/Strength/Tone: WFL for tasks assessed RUE Sensation: WFL - Light Touch;WFL - Proprioception RUE Coordination: WFL - gross/fine motor  RLE ROM/Strength/Tone: Deficits;Due to pain;Due to precautions    ADLs  Eating/Feeding: Simulated;Independent Where Assessed - Eating/Feeding: Bed level Grooming: Simulated;Set up Where Assessed - Grooming: Unsupported sitting Upper Body Bathing: Simulated;Set up Where Assessed - Upper Body Bathing: Unsupported sitting Lower Body Bathing: Simulated;+1 Total assistance Where Assessed - Lower Body Bathing: Supported sit to stand Upper Body Dressing: Simulated;Set up Where Assessed - Upper Body Dressing: Unsupported sitting Lower Body Dressing: Simulated;+1 Total assistance Where Assessed - Lower Body Dressing: Supported sit to stand Toilet Transfer: Simulated;Maximal assistance Toilet Transfer Method: Stand pivot;Sit to Barista: Other (comment) Toileting - Clothing Manipulation and Hygiene: Simulated;+1 Total assistance Where Assessed - Toileting Clothing Manipulation and Hygiene: Standing Equipment Used: Knee Immobilizer;Gait belt;Rolling walker Transfers/Ambulation Related to ADLs: Mod A sit - stand. Max A with ambulation due to L knee buckling.    Mobility  Bed Mobility: Supine to Sit Supine to Sit: 4: Min assist;HOB flat Sitting - Scoot to Edge of Bed: 5: Supervision Sit to Supine: 2: Max assist;HOB flat;With rail Scooting to HOB: 1: +1 Total assist    Transfers  Transfers: Sit to Stand;Stand to Sit Sit to Stand: 4: Min assist;With upper extremity assist;From bed Stand to Sit: 4: Min assist;With upper extremity assist;To chair/3-in-1    Ambulation / Gait / Stairs / Psychologist, prison and probation services  Ambulation/Gait Ambulation/Gait Assistance: 4: Min Environmental consultant (Feet): 45 Feet Assistive device: Rolling walker Ambulation/Gait  Assistance Details: Assist for balance and to off weight right LE in order to maintain PWBing (50%).   Gait Pattern: Step-to pattern;Decreased step length - right;Decreased stance time - right;Trunk flexed Gait velocity: decreased General Gait Details: Pt appears to barely place any weight through RLE  Stairs: No Wheelchair Mobility Wheelchair Mobility: No    Posture / Balance       Previous Home Environment Living Arrangements: Alone Lives With: Alone Available Help at Discharge: Family Type of Home: House Home Layout: Two level;Able to live on main level with bedroom/bathroom Home Access: Stairs to enter Entrance Stairs-Rails: None Entrance Stairs-Number of Steps: 1 (3 steps in front with rails) Bathroom Shower/Tub: Walk-in shower;Door [employer] Toilet: Standard Bathroom Accessibility: Yes How Accessible: Accessible via walker Home Care Services: No Additional Comments: son from [place] to come and stay at least a week, maybe longer  Discharge Living Setting Plans for Discharge Living Setting: Patient's home;Other (Comment) (son, Gracieann Stannard, to come from [place] to stay with her at least o) Type  of Home at Discharge: House Discharge Home Layout: Two level;Able to live on main level with bedroom/bathroom Alternate Level Stairs-Rails: None Discharge Home Access: Stairs to enter Entrance Stairs-Rails: None Entrance Stairs-Number of Steps: 1 Discharge Bathroom Shower/Tub: Garment/textile technologist: Standard Discharge Bathroom Accessibility: Yes How Accessible: Accessible via walker Do you have any problems obtaining your medications?: No  Social/Family/Support Systems Patient Roles: Caregiver;Parent (cares for 29 and 78 year old grandchildren after school daily) Contact Information: Mildreth Reek, son local one mile from her home Anticipated Caregiver: son, Mickenzie Stolar, from [place] Anticipated Caregiver's Contact Information: Local son, Azhane Eckart above contact  numbers Ability/Limitations of Caregiver: to be here 24/7 for at least one week, maybe longer Caregiver Availability: 24/7 Discharge Plan Discussed with Primary Caregiver: No Is Caregiver In Agreement with Plan?:  (n/a) Does Caregiver/Family have Issues with Lodging/Transportation while Pt is in Rehab?: No  Goals/Additional Needs Patient/Family Goal for Rehab: Mod I PT , Mod I OT Expected length of stay: ELOS 10 days Equipment Needs: Foley on admission, has had no BM since admission, taking stool softner. Typical MB every other day Pt/Family Agrees to Admission and willing to participate: Yes Program Orientation Provided & Reviewed with Pt/Caregiver Including Roles  & Responsibilities: Yes  Patient Condition: This patient's condition remains as documented in the Consult dated 07/04/2012, in which the Rehabilitation Physician determined and documented that the patient's condition is appropriate for intensive rehabilitative care in an inpatient rehabilitation facility.  Preadmission Screen Completed By:  Clois Dupes, 07/05/2012 10:46 AM ______________________________________________________________________   Discussed status with Dr. Riley Kill on 07/05/12 at  1046 and received telephone approval for admission today.  Admission Coordinator:  Clois Dupes, time 9604 Date 07/05/2012.

## 2012-07-05 NOTE — Plan of Care (Signed)
Overall Plan of Care [facility]) Patient Details Name: RADIANCE DEADY MRN: [id number] DOB: Jun 30, 1930  Diagnosis:    Primary Diagnosis:    Hip fracture, right Co-morbidities: hyponatremia, afib, abla  Functional Problem List  Patient demonstrates impairments in the following areas: Balance, Bladder, Bowel, Medication Management, Nutrition, Pain, Safety and Skin Integrity  Basic ADL's: grooming, bathing, dressing and toileting Advanced ADL's: simple meal preparation  Transfers:  bed mobility, bed to chair, toilet, tub/shower and car Locomotion:  ambulation, wheelchair mobility and stairs  Additional Impairments:  Leisure Awareness  Anticipated Outcomes Item Anticipated Outcome  Eating/Swallowing    Basic self-care  Supervision/modified independent  Tolieting  Mod I  Bowel/Bladder  Minimal assist  Transfers  Supervision/modified independent  Locomotion  Supervision/ modified independent  Communication    Cognition    Pain    3 or less  Safety/Judgment  Mod I  Other     Therapy Plan: PT Frequency: 2-3 X/day, 60-90 minutes OT Frequency: 1-2 X/day, 60-90 minutes     Team Interventions: Item RN PT OT SLP SW TR Other  Self Care/Advanced ADL Retraining   x      Neuromuscular Re-Education  x x      Therapeutic Activities  x x      UE/LE Strength Training/ROM  x x      UE/LE Coordination Activities   x      Visual/Perceptual Remediation/Compensation         DME/Adaptive Equipment Instruction  x x      Therapeutic Exercise  x x      Balance/Vestibular Training  x x      Patient/Family Education x x x      Cognitive Remediation/Compensation         Functional Mobility Training  x x      Ambulation/Gait Training  x       Museum/gallery curator  x       Wheelchair Propulsion/Positioning  x       Functional Tourist information centre manager Reintegration  x x      Dysphagia/Aspiration Film/video editor         Bladder Management  x        Bowel Management x        Disease Management/Prevention x        Pain Management x x x      Medication Management x        Skin Care/Wound Management x        Splinting/Orthotics         Discharge Planning  x x      Psychosocial Support  x x                         Team Discharge Planning: Destination:  Home Projected Follow-up:  PT, OT, Home Health and Outpatient pending patient preference Projected Equipment Needs:  Water engineer, 3:1 Patient/family involved in discharge planning:  Yes  MD ELOS: 10-14 days Medical Rehab Prognosis:  Excellent Assessment: pt admitted for cir therapies. The team will be addressing self-care, fxnl mobility, weight bearing, safety, pain, stamina. Goals are supervision to mod I except for bowel and bladder mgt

## 2012-07-05 NOTE — Progress Notes (Signed)
Pt arrived to unit 1600. Belongings at bedside. Reviewed rehab booklet and process with pt and pt signed safety plan. Pt comfortable with no complaints of pain. Call bell within reach.

## 2012-07-05 NOTE — H&P (Signed)
Physical Medicine and Rehabilitation Admission H&P  Chief Complaint   Patient presents with   .  Hip fracture   :  HPI: Madeline Serrano is a 78 y.o. female with history of HTN, Atrial Fib with chronic coumadin who fell night prior to admission on 06/29/12 with complaints of right hip pain. Workup revealed nondisplaced right subcapital femoral neck fracture and sodium 123 at admission. Coumadin reversed and patient underwent right hip hemi by Dr Charlann Boxer on 06/30/12. Post op PWB RLE and coumadin resumed. Did develop ABLA and was transfused with 2 unit PRBC. Has had problems voiding requiring cath for 850 cc and foley replaced yesterday. Therapies ongoing and CIR recommended for progression. Patient needs cues for sequencing, as well as maintaining PWB status. Noted to have tachycardia with HR 110's and DOE with ambulation. Digoxin added today. Hyponatremia improving. Started on cipro yesterday for positive UA.  Review of Systems  HENT: Positive for hearing loss.  Eyes: Negative for blurred vision and double vision.  Respiratory: Negative for shortness of breath.  Cardiovascular: Negative for chest pain and palpitations.  Gastrointestinal: Positive for constipation.  Musculoskeletal: Positive for myalgias (sacral tenderness due to abrasion/skin tear.).  Unsteady gait.  Neurological: Positive for weakness. Negative for headaches.  Psychiatric/Behavioral: The patient does not have insomnia.   Past Medical History   Diagnosis  Date   .  A-fib    .  Hypertension    .  High cholesterol    .  Macular degeneration     Past Surgical History   Procedure  Date   .  Hip arthroplasty  06/30/2012     Procedure: ARTHROPLASTY BIPOLAR HIP; Surgeon: Shelda Pal, MD; Location: [facility] OR; Service: Orthopedics; Laterality: Right; right hip hemiarthroplasty    History reviewed. No pertinent family history.  Social History: Lives alone. reports that she has never smoked. She does not have any smokeless tobacco history  on file. She reports that she does not drink alcohol or use illicit drugs.  Allergies   Allergen  Reactions   .  Sulfa Antibiotics  Other (See Comments)     [name]   .  Celebrex (Celecoxib)  Swelling   .  Clarithromycin  Swelling   .  Statins  Other (See Comments)     Hair fell out   .  Vioxx (Rofecoxib)  Other (See Comments)     Renal problems   .  Penicillins  Rash    Scheduled Meds:  .  bumetanide  0.5 mg  Intravenous  Once   .  calcium carbonate  1 tablet  Oral  TID WC   .  carvedilol  25 mg  Oral  BID WC   .  ciprofloxacin  500 mg  Oral  BID   .  ciprofloxacin  500 mg  Oral  Once   .  digoxin  0.125 mg  Oral  Daily   .  docusate sodium  100 mg  Oral  BID   .  ferrous sulfate  325 mg  Oral  TID PC   .  flecainide  50 mg  Oral  BID   .  loratadine  10 mg  Oral  Daily   .  multivitamin-lutein  1 capsule  Oral  Daily   .  pantoprazole  40 mg  Oral  Daily   .  vitamin B-6  200 mg  Oral  BID   .  tiotropium  18 mcg  Inhalation  Daily   .  warfarin  1 mg  Oral  ONCE-1800   .  Warfarin - Pharmacist Dosing Inpatient   Does not apply  q1800    Medications Prior to Admission   Medication  Sig  Dispense  Refill   .  calcium carbonate (OS-CAL) 600 MG TABS  Take 600 mg by mouth 3 (three) times daily with meals.     .  carvedilol (COREG) 12.5 MG tablet  Take 18.75 mg by mouth 2 (two) times daily with a meal.     .  estrogen, conjugated,-medroxyprogesterone (PREMPRO) 0.3-1.5 MG per tablet  Take 1 tablet by mouth daily. 5 days a week  Does not take on Tuesday and Friday     .  fish oil-omega-3 fatty acids 1000 MG capsule  Take 1 g by mouth daily.     .  flecainide (TAMBOCOR) 50 MG tablet  Take 50 mg by mouth 2 (two) times daily.     .  lansoprazole (PREVACID) 15 MG capsule  Take 15 mg by mouth every other day.     .  Multiple Vitamins-Minerals (ICAPS) TABS  Take 1 tablet by mouth daily.     Marland Kitchen  olmesartan (BENICAR) 20 MG tablet  Take 20 mg by mouth daily.     Marland Kitchen  pyridoxine  (B-6) 200 MG tablet  Take 200 mg by mouth 2 (two) times daily.     .  rosuvastatin (CRESTOR) 10 MG tablet  Take 5 mg by mouth every Monday, Wednesday, and Friday.     .  tiotropium (SPIRIVA) 18 MCG inhalation capsule  Place 18 mcg into inhaler and inhale daily.     Marland Kitchen  triamterene-hydrochlorothiazide (MAXZIDE) 75-50 MG per tablet  Take 0.5 tablets by mouth daily.     Marland Kitchen  warfarin (COUMADIN) 4 MG tablet  Take 2-4 mg by mouth daily. Takes 1 tablet on Monday, and Friday  Takes 1/2 tablet the rest of the week     .  fexofenadine (ALLEGRA) 180 MG tablet  Take 180 mg by mouth daily as needed. For allergies      Home:  Home Living  Lives With: Alone  Available Help at Discharge: Family  Type of Home: House  Home Access: Stairs to enter  [employer] of Steps: 1 (3 steps in front with rails)  Entrance Stairs-Rails: None  Home Layout: Two level;Able to live on main level with bedroom/bathroom  Bathroom Shower/Tub: Walk-in shower;Door  [employer] Toilet: Standard  Bathroom Accessibility: Yes  How Accessible: Accessible via walker  Home Adaptive Equipment: Built-in shower seat;Straight cane  Additional Comments: son from [place] to come and stay at least a week, maybe longer  Functional History:  Prior Function  Able to Take Stairs?: Yes  Driving: Yes  Vocation: Retired  Comments: 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 reconstruction surgeries with no finger joints limits grip to r hand  Functional Status:  Mobility:  Bed Mobility  Bed Mobility: Supine to Sit  Supine to Sit: 4: Min assist;HOB flat  Sitting - Scoot to Edge of Bed: 5: Supervision  Sit to Supine: 2: Max assist;HOB flat;With rail  Scooting to HOB: 1: +1 Total assist  Transfers  Transfers: Sit to Stand;Stand to Sit  Sit to Stand: 4: Min assist;With upper extremity assist;From bed  Stand to Sit: 4: Min assist;With upper extremity assist;To chair/3-in-1  Ambulation/Gait  Ambulation/Gait  Assistance: 4: Min assist  Ambulation Distance (Feet): 45 Feet  Assistive device: Rolling walker  Ambulation/Gait Assistance Details: Assist for balance and to off weight right LE in order to maintain PWBing (50%).  Gait Pattern: Step-to pattern;Decreased step length - right;Decreased stance time - right;Trunk flexed  Gait velocity: decreased  General Gait Details: Pt appears to barely place any weight through RLE  Stairs: No  Wheelchair Mobility  Wheelchair Mobility: No  ADL:  ADL  Eating/Feeding: Simulated;Independent  Where Assessed - Eating/Feeding: Bed level  Grooming: Simulated;Set up  Where Assessed - Grooming: Unsupported sitting  Upper Body Bathing: Simulated;Set up  Where Assessed - Upper Body Bathing: Unsupported sitting  Lower Body Bathing: Simulated;+1 Total assistance  Where Assessed - Lower Body Bathing: Supported sit to stand  Upper Body Dressing: Simulated;Set up  Where Assessed - Upper Body Dressing: Unsupported sitting  Lower Body Dressing: Simulated;+1 Total assistance  Where Assessed - Lower Body Dressing: Supported sit to stand  Toilet Transfer: Simulated;Maximal assistance  Toilet Transfer Method: Stand pivot;Sit to Production manager: Other (comment)  Equipment Used: Knee Immobilizer;Gait belt;Rolling walker  Transfers/Ambulation Related to ADLs: Mod A sit - stand. Max A with ambulation due to L knee buckling.  Cognition:  Cognition  Arousal/Alertness: Awake/alert  Orientation Level: Oriented X4  Cognition  Overall Cognitive Status: Appears within functional limits for tasks assessed/performed  Arousal/Alertness: Awake/alert  Orientation Level: Appears intact for tasks assessed  Behavior During Session: [facility] for tasks performed  Blood pressure 123/64, pulse 102, temperature 98.7 F (37.1 C), temperature source Oral, resp. rate 18, height 5' 5.75" (1.67 m), weight 81.647 kg (180 lb), SpO2 97.00%.  Physical Exam  Nursing note and vitals  reviewed.  Constitutional: She is oriented to person, place, and time. She appears well-developed and well-nourished.  HENT:  Head: Normocephalic and atraumatic.  Eyes: Pupils are equal, round, and reactive to light.  Neck: Normal range of motion.  Cardiovascular: Tachycardia present. No murmur, irregular Pulmonary/Chest: Effort normal and breath sounds normal. No wheezes, no distress Abdominal: Normal appearance and bowel sounds are normal. She exhibits no distension.  Musculoskeletal: She exhibits edema (RLE at thigh/shin).  Right 2nd and 3rd atrophied digits. Right leg limited in movement due to pain proximally. LLE intact for motor function. UE's grossly intact except for intrinsic weakness in the right hand Neurological: She is alert and oriented to person, place, and time.  Skin: Skin is warm and dry.  Surgical dressing right hip.   BASIC METABOLIC PANEL Status: Abnormal    Collection Time    07/05/12 6:52 AM   Component  Value  Range  Comment    Sodium  129 (*)  135 - 145 mEq/L     Potassium  4.3  3.5 - 5.1 mEq/L     Chloride  98  96 - 112 mEq/L     CO2  19  19 - 32 mEq/L     Glucose, Bld  119 (*)  70 - 99 mg/dL     BUN  19  6 - 23 mg/dL     Creatinine, Ser  1.61 (*)  0.50 - 1.10 mg/dL     Calcium  8.5  8.4 - 10.5 mg/dL     GFR calc non Af Amer  42 (*)  >90 mL/min     GFR calc Af Amer  48 (*)  >90 mL/min    CBC Status: Abnormal    Collection Time    07/05/12 6:52 AM   Component  Value  Range  Comment    WBC  8.0  4.0 -  10.5 K/uL     RBC  2.99 (*)  3.87 - 5.11 MIL/uL     Hemoglobin  9.2 (*)  12.0 - 15.0 g/dL     HCT  40.3 (*)  47.4 - 46.0 %     MCV  90.3  78.0 - 100.0 fL     MCH  30.8  26.0 - 34.0 pg     MCHC  34.1  30.0 - 36.0 g/dL     RDW  25.9  56.3 - 87.5 %     Platelets  250  150 - 400 K/uL    PROTIME-INR Status: Abnormal    Collection Time    07/05/12 6:52 AM   Component  Value  Range  Comment    Prothrombin Time  28.2 (*)  11.6 - 15.2 seconds     INR  2.82 (*)   0.00 - 1.49    PRO B NATRIURETIC PEPTIDE Status: Abnormal    Collection Time    07/05/12 6:52 AM   Component  Value  Range  Comment    Pro B Natriuretic peptide (BNP)  9816.0 (*)  0 - 450 pg/mL    Dg Chest 2 View  07/04/2012 *RADIOLOGY REPORT* Clinical Data: Shortness of breath CHEST - 2 VIEW Comparison: 06/29/2012 Findings: Normal heart size and vascularity. Low lung volumes evident. Negative for CHF or pneumonia. No collapse, consolidation, effusion or pneumothorax. Degenerative changes of the left shoulder. No significant interval change. IMPRESSION: Low volume exam. No acute process Original Report Authenticated By: Judie Petit. Ruel Favors, M.D.   Post Admission Physician Evaluation:  1. Functional deficits secondary to right femoral neck fx. 2. Patient is admitted to receive collaborative, interdisciplinary care between the physiatrist, rehab nursing staff, and therapy team. 3. Patient's level of medical complexity and substantial therapy needs in context of that medical necessity cannot be provided at a lesser intensity of care such as a SNF. 4. Patient has experienced substantial functional loss from his/her baseline which was documented above under the "Functional History" and "Functional Status" headings. Judging by the patient's diagnosis, physical exam, and functional history, the patient has potential for functional progress which will result in measurable gains while on inpatient rehab. These gains will be of substantial and practical use upon discharge in facilitating mobility and self-care at the household level. 5. Physiatrist will provide 24 hour management of medical needs as well as oversight of the therapy plan/treatment and provide guidance as appropriate regarding the interaction of the two. 6. 24 hour rehab nursing will assist with bladder management, bowel management, safety, skin/wound care, disease management, medication administration, pain management and patient education and help  integrate therapy concepts, techniques,education, etc. 7. PT will assess and treat for: fxnl mobility, lower ext strength, wb precautions, safety, adaptive equipment. Goals are: mod I to supervision. 8. OT will assess and treat for: UES, ADL's, fxnl mobility, safety. Goals are: mod I to supervsion. 9. SLP will assess and treat for: n/a. Goals are: n/a. 10. Case Management and Social Worker will assess and treat for psychological issues and discharge planning. 11. Team conference will be held weekly to assess progress toward goals and to determine barriers to discharge. 12. Patient will receive at least 3 hours of therapy per day at least 5 days per week. 13. ELOS: 7-10 days Prognosis: excellent Medical Problem List and Plan:  1. DVT Prophylaxis/Anticoagulation: Pharmaceutical: Coumadin  2. Pain Management: control reasonable.  3. Mood: motivated without signs of distress.  4. Neuropsych: This patient is  capable of making decisions on his/her own behalf.  5. Atrial fibrillation with RVR: monitor HR with bid checks. Continue coreg, digoxin, and flecainide.  6. UTI: cipro D#2.  7. Urinary retention: Discontinue foley and start voiding trial. Get pt to [facility] to void and cath for PVR greater than 350 cc.  8. ABLA: continue iron supplement. Recheck in am.  9. Chronic hyponatremia?: Na improved to 129. Will monitor for now.  10. Fluid overload: Improved past diuretics. Will check daily weights and monitor for signs of overload.  Ivory Broad, MD

## 2012-07-05 NOTE — Progress Notes (Signed)
ANTICOAGULATION CONSULT - COUMADIN  Pharmacy Consult for Coumadin  HPI: 78 y/o female was admitted for femoral neck fracture s/p fall, she is now s/p R hip arthroplasty. She was on Coumadin PTA for afib.  Allergies: Allergies  Allergen Reactions  . Sulfa Antibiotics Other (See Comments)    Madeline Serrano  . Celebrex (Celecoxib) Swelling  . Clarithromycin Swelling  . Statins Other (See Comments)    Hair fell out  . Vioxx (Rofecoxib) Other (See Comments)    Renal problems  . Penicillins Rash    Height/Weight: Height: 5' 5.75" (167 cm) Weight: 180 lb (81.647 kg) IBW/kg (Calculated) : 58.72   Vitals: Blood pressure 123/64, pulse 102, temperature 98.7 F (37.1 C), temperature source Oral, resp. rate 18, height 5' 5.75" (1.67 m), weight 180 lb (81.647 kg), SpO2 97.00%.  Current active problems: Principal Problem:  *Hip fracture, right Active Problems:  Hyponatremia  Coagulopathy  Atrial fibrillation/flutter with Rapid Ventricular Rate  Osteoporosis  Hypertension  Acute blood loss anemia  Urinary retention  COPD (chronic obstructive pulmonary disease)   Medical / Surgical History: Past Medical History  Diagnosis Date  . A-fib   . Hypertension   . High cholesterol   . Macular degeneration    Past Surgical History  Procedure Date  . Hip arthroplasty 06/30/2012    Procedure: ARTHROPLASTY BIPOLAR HIP;  Surgeon: Shelda Pal, MD;  Location: [facility] OR;  Service: Orthopedics;  Laterality: Right;  right hip hemiarthroplasty    Current Labs:  [facility] 07/05/12 4782 07/04/12 0557 07/03/12 1918 07/03/12 0545  HGB 9.2* 9.1* -- --  HCT 27.0* 26.9* 25.8* --  PLT 250 231 224 --  LABPROT 28.2* 26.0* -- 24.8*  INR 2.82* 2.52* -- 2.37*  CREATININE 1.20* 1.21* -- 1.27*   Lab Results  Component Value Date   INR 2.82* 07/05/2012   INR 2.52* 07/04/2012   INR 2.37* 07/03/2012   Estimated Creatinine Clearance: 40.1 ml/min (by C-G formula based on Cr of 1.2).  Pertinent  Medication History: Medication Sig  . warfarin (COUMADIN) 4 MG tablet Take 2-4 mg by mouth daily. Takes 1 tablet on Monday, and Friday Takes 1/2 tablet the rest of the week   Scheduled:    . bumetanide  0.5 mg Intravenous Once  . calcium carbonate  1 tablet Oral TID WC  . carvedilol  25 mg Oral BID WC  . ciprofloxacin  500 mg Oral BID  . ciprofloxacin  500 mg Oral Once  . digoxin  0.125 mg Oral Daily  . docusate sodium  100 mg Oral BID  . ferrous sulfate  325 mg Oral TID PC  . flecainide  50 mg Oral BID  . loratadine  10 mg Oral Daily  . multivitamin-lutein  1 capsule Oral Daily  . pantoprazole  40 mg Oral Daily  . vitamin B-6  200 mg Oral BID  . tiotropium  18 mcg Inhalation Daily  . warfarin  1 mg Oral ONCE-1800  . Warfarin - Pharmacist Dosing Inpatient   Does not apply q1800    Assessment:  Today's INR trending up slowly despite Coumadin dose reductions.  No complications noted.  Cipro started 07/04/12 for presumed UTI.  This combination should further increase patient's response to Coumadin and result in even lower maintenance dosage requirements.  Goals:  Target INR of 2-3.  Plan:  Will hold Coumadin today.    Daily INR's, CBC.  Coumadin discharge education completed.  Madeline Serrano, Elisha Headland, Pharm. D. 07/05/2012, 9:59 AM

## 2012-07-05 NOTE — Progress Notes (Addendum)
OT NOTE  OT treatment ordered however OT called CIR coordinator [facility] regarding this admission. Patient will have OT evaluation deferred to CIR at this time. OT will follow acutely if CIR admission is delayed.   Madeline Serrano   OTR/L Pager: [phone] Office: [phone] .

## 2012-07-06 ENCOUNTER — Inpatient Hospital Stay (HOSPITAL_COMMUNITY): Payer: Medicare Other | Admitting: Occupational Therapy

## 2012-07-06 ENCOUNTER — Inpatient Hospital Stay (HOSPITAL_COMMUNITY): Payer: Medicare Other | Admitting: Physical Therapy

## 2012-07-06 LAB — COMPREHENSIVE METABOLIC PANEL
Alkaline Phosphatase: 71 U/L (ref 39–117)
BUN: 17 mg/dL (ref 6–23)
Chloride: 99 mEq/L (ref 96–112)
GFR calc Af Amer: 50 mL/min — ABNORMAL LOW (ref >90)
GFR calc non Af Amer: 43 mL/min — ABNORMAL LOW (ref >90)
Glucose, Bld: 108 mg/dL — ABNORMAL HIGH (ref 70–99)
Potassium: 4.2 mEq/L (ref 3.5–5.1)
Total Bilirubin: 0.6 mg/dL (ref 0.3–1.2)

## 2012-07-06 LAB — CBC WITH DIFFERENTIAL/PLATELET
Hemoglobin: 9 g/dL — ABNORMAL LOW (ref 12.0–15.0)
Lymphs Abs: 0.9 10*3/uL (ref 0.7–4.0)
MCH: 31 pg (ref 26.0–34.0)
Monocytes Relative: 15 % — ABNORMAL HIGH (ref 3–12)
Neutro Abs: 5 10*3/uL (ref 1.7–7.7)
Neutrophils Relative %: 69 % (ref 43–77)
RBC: 2.9 MIL/uL — ABNORMAL LOW (ref 3.87–5.11)

## 2012-07-06 LAB — PROTIME-INR: Prothrombin Time: 30.2 seconds — ABNORMAL HIGH (ref 11.6–15.2)

## 2012-07-06 NOTE — Progress Notes (Signed)
Social Work  Social Work Assessment and Plan  Patient Details  Name: Madeline Serrano MRN: [id number] Date of Birth: 26-Sep-1930  Today's Date: 07/06/2012  Problem List:  Patient Active Problem List  Diagnosis  . Hip fracture, right  . Hyponatremia  . Coagulopathy  . Atrial fibrillation/flutter with Rapid Ventricular Rate  . Osteoporosis  . Hypertension  . Acute blood loss anemia  . Urinary retention  . COPD (chronic obstructive pulmonary disease)   Past Medical History:  Past Medical History  Diagnosis Date  . A-fib   . Hypertension   . High cholesterol   . Macular degeneration    Past Surgical History:  Past Surgical History  Procedure Date  . Hip arthroplasty 06/30/2012    Procedure: ARTHROPLASTY BIPOLAR HIP;  Surgeon: Shelda Pal, MD;  Location: [facility] OR;  Service: Orthopedics;  Laterality: Right;  right hip hemiarthroplasty   Social History:  reports that she has never smoked. She does not have any smokeless tobacco history on file. She reports that she does not drink alcohol or use illicit drugs.  Family / Support Systems Marital Status: Widow/Widower How Long?: 11 years Patient Roles: Caregiver;Parent (cares for 63 and 33 year old grandchildren after school daily) Children: son and daughter-in-law (live approx 1 mile from pt): Madeline Serrano @ (H) [phone] or (C) [phone] and Madeline Serrano @ (C) [phone];  one other son, Madeline Serrano, living in [place] Anticipated Caregiver: son, Madeline Serrano, from [place] plans to come to stay with pt approx one week at d/c Ability/Limitations of Caregiver: to be here 24/7 for at least one week, maybe longer Caregiver Availability: 24/7 Family Dynamics: Pt describes excellent support from sons and daughter-in-law  Social History Preferred language: English Religion:  Cultural Background: NA Read: Yes Write: Yes Employment Status: Retired Age Retired: 78  Fish farm manager Issues: none Guardian/Conservator: none   Abuse/Neglect Physical  Abuse: Denies Verbal Abuse: Denies Sexual Abuse: Denies Exploitation of patient/patient's resources: Denies Self-Neglect: Denies  Emotional Status Pt's affect, behavior adn adjustment status: Pt very talkative and humorous throughout interview.  Fully oriented and motivated for therapies.  Denies any significant emotional distress, however, admits frustration over "...my own body and all it's problems". Recent Psychosocial Issues: None Pyschiatric History: None Substance Abuse History: None  Patient / Family Perceptions, Expectations & Goals Pt/Family understanding of illness & functional limitations: Pt with good,  basic understanding of her injury, surgery performed and current weight bearing restrictions/ functional limitations.  Premorbid pt/family roles/activities: Pt was completely independent and providing after school care to her two local grandchildren Anticipated changes in roles/activities/participation: Pt may require some support herself upon d/c - i.e. assistance with groceries and any transportation needs. Pt/family expectations/goals: "I really need to be able to do most things for myself"  [employer]: None Premorbid Home Care/DME Agencies: None Transportation available at discharge: yes  Discharge Planning Living Arrangements: Alone Support Systems: Children Type of Residence: Private residence Insurance Resources: Administrator (specify) Financial Resources: Restaurant manager, fast food Screen Referred: No Living Expenses: Own Money Management: Patient Do you have any problems obtaining your medications?: No Home Management: pt Patient/Family Preliminary Plans: Pt plans to return to her own home with son, Madeline Serrano, to stay with her approx one week and assist Social Work Anticipated Follow Up Needs: HH/OP Expected length of stay: ELOS 10 days  Clinical Impression Pleasant, talkative woman here after fall and hip fx.  Denies  any significant emotional distress, however, admits frustration over current limitations and likelihood  of "being a burden" on her children.  Good family support.   Jedrick Hutcherson 07/06/2012, 1:01 PM

## 2012-07-06 NOTE — Progress Notes (Signed)
Foley Catheter removed by nurse at 0600. Hawa, NT and Icholas Irby, RN went into her room around 1215 to attempt to scan her bladder. Pt refused to get into bed and Refused to try to empty her bladder. She stated it hasn't been 8hrs since removal of foley. I explained we need to scan her within 6 hours to see her volume in her bladder. She continued to refuse the bladder scan and attempt to empty. Pt very argumentive with staff.

## 2012-07-06 NOTE — Progress Notes (Signed)
ANTICOAGULATION CONSULT NOTE - Initial Consult  Pharmacy Consult for coumadin Indication: atrial fibrillation  Allergies  Allergen Reactions  . Sulfa Antibiotics Other (See Comments)    [name]  . Celebrex (Celecoxib) Swelling  . Clarithromycin Swelling  . Statins Other (See Comments)    Hair fell out  . Vioxx (Rofecoxib) Other (See Comments)    Renal problems  . Penicillins Rash    Patient Measurements: Wt=81.6kg  Vital Signs: Temp: 97.8 F (36.6 C) (10/03 0500) Temp src: Oral (10/03 0500) BP: 146/85 mmHg (10/03 0500) Pulse Rate: 126  (10/03 0915)  Labs:  Basename 07/06/12 1250 07/06/12 0420 07/05/12 0652 07/04/12 0557  HGB -- 9.0* 9.2* --  HCT -- 26.3* 27.0* 26.9*  PLT -- 259 250 231  APTT -- -- -- --  LABPROT 30.2* -- 28.2* 26.0*  INR 3.09* -- 2.82* 2.52*  HEPARINUNFRC -- -- -- --  CREATININE -- 1.17* 1.20* 1.21*  CKTOTAL -- -- -- --  CKMB -- -- -- --  TROPONINI -- -- -- --    The CrCl is unknown because both a height and weight (above a minimum accepted value) are required for this calculation.   Medical History: Past Medical History  Diagnosis Date  . A-fib   . Hypertension   . High cholesterol   . Macular degeneration     Medications:  Prescriptions prior to admission  Medication Sig Dispense Refill  . calcium carbonate (OS-CAL) 600 MG TABS Take 600 mg by mouth 3 (three) times daily with meals.      . carvedilol (COREG) 12.5 MG tablet Take 18.75 mg by mouth 2 (two) times daily with a meal.       . estrogen, conjugated,-medroxyprogesterone (PREMPRO) 0.3-1.5 MG per tablet Take 1 tablet by mouth daily. 5 days a week Does not take on Tuesday and Friday      . fexofenadine (ALLEGRA) 180 MG tablet Take 180 mg by mouth daily as needed. For allergies       . fish oil-omega-3 fatty acids 1000 MG capsule Take 1 g by mouth daily.       . flecainide (TAMBOCOR) 50 MG tablet Take 50 mg by mouth 2 (two) times daily.        . lansoprazole (PREVACID) 15  MG capsule Take 15 mg by mouth every other day.      . Multiple Vitamins-Minerals (ICAPS) TABS Take 1 tablet by mouth daily.      Marland Kitchen olmesartan (BENICAR) 20 MG tablet Take 20 mg by mouth daily.      Marland Kitchen pyridoxine (B-6) 200 MG tablet Take 200 mg by mouth 2 (two) times daily.       . rosuvastatin (CRESTOR) 10 MG tablet Take 5 mg by mouth every Monday, Wednesday, and Friday.       . tiotropium (SPIRIVA) 18 MCG inhalation capsule Place 18 mcg into inhaler and inhale daily.      Marland Kitchen triamterene-hydrochlorothiazide (MAXZIDE) 75-50 MG per tablet Take 0.5 tablets by mouth daily.      Marland Kitchen warfarin (COUMADIN) 4 MG tablet Take 2-4 mg by mouth daily. Takes 1 tablet on Monday, and Friday Takes 1/2 tablet the rest of the week        Assessment: 78 yo female s/p R hip arthroplastyand also w/ afib on coumadin PTA now transferred to rehab INR is at goal (2.82) and home dose is 2mg  STWTS and 4mg  MF. INR has been trending up and patient noted on cipro.  Goal of Therapy:  INR 2-3 Monitor platelets by anticoagulation protocol: Yes   Plan:  - Hold coumadin again tonight with increased INR this am - Continue daily INR - Coumadin discharge education has been completed  Jill Side L. Illene Bolus, PharmD, BCPS Clinical Pharmacist Pager: [phone] Pharmacy: [phone] 07/06/2012 2:00 PM

## 2012-07-06 NOTE — Evaluation (Signed)
Occupational Therapy Assessment and Plan  Patient Details  Name: Madeline Serrano MRN: [id number] Date of Birth: 15-Apr-1930  OT Diagnosis: acute pain, muscle weakness (generalized) and pain in joint Rehab Potential: Rehab Potential: Excellent ELOS: 12 -14 days   Today's Date: 07/06/2012 Time: 0905-1004 Time Calculation (min): 59 min  Problem List:  Patient Active Problem List  Diagnosis  . Hip fracture, right  . Hyponatremia  . Coagulopathy  . Atrial fibrillation/flutter with Rapid Ventricular Rate  . Osteoporosis  . Hypertension  . Acute blood loss anemia  . Urinary retention  . COPD (chronic obstructive pulmonary disease)    Past Medical History:  Past Medical History  Diagnosis Date  . A-fib   . Hypertension   . High cholesterol   . Macular degeneration    Past Surgical History:  Past Surgical History  Procedure Date  . Hip arthroplasty 06/30/2012    Procedure: ARTHROPLASTY BIPOLAR HIP;  Surgeon: Shelda Pal, MD;  Location: [facility] OR;  Service: Orthopedics;  Laterality: Right;  right hip hemiarthroplasty    Assessment & Plan Clinical Impression: Patient is a 78 y.o. year old female with recent admission to the hospital on 06/29/12 with complaints of right hip pain. Workup revealed nondisplaced right subcapital femoral neck fracture and sodium 123 at admission. Coumadin reversed and patient underwent right hip hemi by Dr Charlann Boxer on 06/30/12. Post op PWB RLE and coumadin resumed. Did develop ABLA and was transfused with 2 unit PRBC.  Patient transferred to CIR on 07/05/2012 .    Patient currently requires mod with basic self-care skills secondary to muscle weakness and muscle joint tightness.  Prior to hospitalization, patient could complete ADLS and IADLS with independence.  Patient will benefit from skilled intervention to decrease level of assist with basic self-care skills, increase independence with basic self-care skills and increase level of independence with iADL prior to  discharge home with son who will stay with her initially.  Anticipate patient will require intermittent supervision and follow up home health.  OT - End of Session Activity Tolerance: Tolerates < 10 min activity with changes in vital signs Endurance Deficit: Yes Endurance Deficit Description: needs multiple rest breaks after standing or with mobility.  Dyspnea 3/4 OT Assessment Rehab Potential: Excellent Barriers to Discharge: None OT Plan OT Frequency: 1-2 X/day, 60-90 minutes Estimated Length of Stay: 12 -14 days OT Treatment/Interventions: Social worker;Discharge planning;Pain management;Functional mobility training;Self Care/advanced ADL retraining;UE/LE Strength taining/ROM;Therapeutic Exercise;Therapeutic Activities OT Recommendation Follow Up Recommendations: Home health OT Equipment Recommended: 3 in 1 bedside comode  OT Evaluation Precautions/Restrictions  Precautions Precautions: Posterior Hip Restrictions Weight Bearing Restrictions: Yes RLE Weight Bearing: Partial weight bearing RLE Partial Weight Bearing Percentage or Pounds: 50% General   Vital Signs Oxygen Therapy O2 Device: None (Room air) Pain  Pt reports pain 2/10 during session Home Living/Prior Functioning Home Living Lives With: Alone Available Help at Discharge: Family;Available PRN/intermittently Type of Home: House Home Access: Stairs to enter [employer] of Steps: 1 (3 steps in front with rails) Entrance Stairs-Rails: None Home Layout: Two level;Able to live on main level with bedroom/bathroom Bathroom Shower/Tub: Walk-in shower;Door [employer] Toilet: Standard Bathroom Accessibility: Yes How Accessible: Accessible via walker Home Adaptive Equipment: Built-in shower seat;Straight cane Additional Comments: Son is coming down to stay with pt for 1 week and longer if needed. Prior Function Level of Independence: Independent with  transfers;Independent with basic ADLs;Independent with gait Able to Take Stairs?: Yes Driving: Yes Vocation: Retired Leisure: Hobbies-yes (Comment) Comments: Read,  used to crochet but now limited by arthritis in hands, likes to bake, takes care of grandkids ADL  See FIM Vision/Perception  Vision - History Baseline Vision: Wears glasses all the time Patient Visual Report: No change from baseline Vision - Assessment Eye Alignment: Within Functional Limits Vision Assessment: Vision not tested Perception Perception: Within Functional Limits Praxis Praxis: Intact  Cognition Overall Cognitive Status: Appears within functional limits for tasks assessed Arousal/Alertness: Awake/alert Orientation Level: Oriented X4 Attention: Alternating Alternating Attention: Appears intact Memory: Appears intact Awareness: Appears intact Problem Solving: Appears intact Safety/Judgment: Appears intact Comments: Pt overly anxious with all mobility, nearly impulsive secondary to anxiety.  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  Limited sensation and functional use. Coordination Gross Motor Movements are Fluid and Coordinated: Yes Fine Motor Movements are Fluid and Coordinated: No Motor  Motor Motor: Within Functional Limits Mobility  Bed Mobility Bed Mobility: Supine to Sit Supine to Sit: 4: Min assist Supine to Sit Details (indicate cue type and reason): Poor efficiency, able to maintain precautions.  Sit to Supine: 4: Min assist Sit to Supine - Details (indicate cue type and reason): Assist for Rt. LE, cues for efficiency Transfers Sit to Stand: 4: Min assist;With upper extremity assist;With armrests Sit to Stand Details: Verbal cues for technique Sit to Stand Details  (indicate cue type and reason): Cues for Rt. LE placement for precautions and pain management. Cues for safe UE placement and locking brakes.  Stand to Sit: 4: Min assist;With armrests Stand to Sit Details (indicate cue type and reason): Verbal cues for technique  Trunk/Postural Assessment  Thoracic Assessment Thoracic Assessment: Within Functional Limits Lumbar Assessment Lumbar Assessment: Within Functional Limits Postural Control Postural Control: Within Functional Limits  Balance Balance Balance Assessed: Yes Static Sitting Balance Static Sitting - Balance Support: No upper extremity supported;Feet supported Static Sitting - Level of Assistance: 5: Stand by assistance Static Standing Balance Static Standing - Balance Support: Left upper extremity supported Static Standing - Level of Assistance: 4: Min assist Dynamic Standing Balance Dynamic Standing - Balance Support: Right upper extremity supported;Left upper extremity supported Dynamic Standing - Level of Assistance: 3: Mod assist Dynamic Standing - Comments: Pt consistently needs min assist, 2 losses of balance while placing dentures that required mod assist to correct.  Extremity/Trunk Assessment RUE Assessment RUE Assessment: Exceptions to [facility] RUE AROM (degrees) RUE Overall AROM Comments: Pt with full AROM in all joints except 2nd and 3rd digits secondary to prre-existing amputations.  Strength shroughout the UE 4/5 at the shoulder and elbow. LUE Assessment LUE Assessment: Exceptions to [facility] LUE Strength LUE Overall Strength Comments: Pt demonstrates shoulder internal rotation limitations secondary to recent diagnosis of bursitis.  Able to exhibit aproximately 75% of internal rotation when attempting to reache behind her back.  Shoulder flexion AROM WFLs with strength at 3+/5.  Elbow flexion/extension and grip 4/5.  See FIM for current functional status Refer to Care Plan for Long Term Goals  Recommendations for other  services: None  Discharge Criteria: Patient will be discharged from OT if patient refuses treatment 3 consecutive times without medical reason, if treatment goals not met, if there is a change in medical status, if patient makes no progress towards goals or if patient is discharged from hospital.  The above assessment, treatment plan, treatment alternatives and goals were discussed and  mutually agreed upon: by patient  During session began education on balance, selfcare retraining, walker safety, and safe sequencing and technique for functional transfers.  Pt with increased anxiety related to her fluid retention during session.  Directed concerns to nursing.  Pt able to recall 3/3 hip precautions.  Arlenne Kimbley OTR/L 07/06/2012, 1:27 PM

## 2012-07-06 NOTE — Clinical Social Work Psychosocial (Signed)
     Clinical Social Work Department BRIEF PSYCHOSOCIAL ASSESSMENT 07/06/2012  Patient:  ANGELITA, PHARRIS     Account Number:  [id number]     Admit date:  07/05/2012  Clinical Social Worker:  ,  Date/Time:  07/01/2012 05:00 PM  Referred by:  Physician  Date Referred:  07/03/2012 Referred for  Other - See comment   Other Referral:   CIR vs SNF   Interview type:  Patient Other interview type:    PSYCHOSOCIAL DATA Living Status:  ALONE Admitted from facility:   Level of care:   Primary support name:   Primary support relationship to patient:   Degree of support available:    CURRENT CONCERNS Current Concerns  Post-Acute Placement   Other Concerns:   Wants CIR    SOCIAL WORK ASSESSMENT / PLAN Met with patient- she is hoping to go to CIR and states that PT has recommended this option to her. Discussed SNF placement as a back up and related to her placement process.  She related that if she had to be placed in a SNF she hoped for placement at [facility]. FL2 will be intiitated and bed search started.  Will discusse  with RN Liason for CIR and follow up as appropriate   Assessment/plan status:  Psychosocial Support/Ongoing Assessment of Needs Other assessment/ plan:   Information/referral to community resources:   SNF bed list provided    PATIENTS/FAMILYS RESPONSE TO PLAN OF CARE: Patient is alert, oriented and very pleasaant. She agrees to short term SNF if CIR cannot assist.  CSW will monitor.

## 2012-07-06 NOTE — Progress Notes (Signed)
Occupational Therapy Session Note  Patient Details  Name: NATIKA GEYER MRN: [id number] Date of Birth: 09-May-1930  Today's Date: 07/06/2012 Time: 1102-1132 Time Calculation (min): 30 min  Short Term Goals: Week 1:  OT Short Term Goal 1 (Week 1): Pt will perform LB bathing with min assist using AE. OT Short Term Goal 2 (Week 1): Pt will perform LB dressing using AE with min assist. OT Short Term Goal 3 (Week 1): Pt will perfrom toilt transfers maintaining PWBing status with supervision. OT Short Term Goal 4 (Week 1): Pt will perform walk-in shower transfer with min assist to shower seat using RW.  Skilled Therapeutic Interventions/Progress Updates:   Worked on static standing balance and endurance at high/low table while engaged in therapeutic activity.  Pt needing min assist for sit to stand from lower wheelchair.  Able to maintain static standing for intervals of 5 mins with at least one UE supported on the table.  After 5 mins pt reported her right LE beginning to fatigue and needed to rest.    Therapy Documentation Precautions:  Precautions Precautions: Posterior Hip Restrictions Weight Bearing Restrictions: Yes RLE Weight Bearing: Partial weight bearing RLE Partial Weight Bearing Percentage or Pounds: 50%  Pain:   2/10 in the right hip  See FIM for current functional status  Therapy/Group: Individual Therapy  Cebert Dettmann OTR/L 07/06/2012, 1:35 PM

## 2012-07-06 NOTE — Evaluation (Signed)
Physical Therapy Assessment and Plan  Patient Details  Name: Madeline Serrano MRN: [id number] Date of Birth: 1930/01/14  PT Diagnosis: Abnormality of gait, Difficulty walking, Muscle weakness and Pain in joint Rehab Potential: Good ELOS: 7-10 days   Today's Date: 07/06/2012 Time: 0730-0829 Time Calculation (min): 59 min  Problem List:  Patient Active Problem List  Diagnosis  . Hip fracture, right  . Hyponatremia  . Coagulopathy  . Atrial fibrillation/flutter with Rapid Ventricular Rate  . Osteoporosis  . Hypertension  . Acute blood loss anemia  . Urinary retention  . COPD (chronic obstructive pulmonary disease)    Past Medical History:  Past Medical History  Diagnosis Date  . A-fib   . Hypertension   . High cholesterol   . Macular degeneration    Past Surgical History:  Past Surgical History  Procedure Date  . Hip arthroplasty 06/30/2012    Procedure: ARTHROPLASTY BIPOLAR HIP;  Surgeon: Shelda Pal, MD;  Location: [facility] OR;  Service: Orthopedics;  Laterality: Right;  right hip hemiarthroplasty    Assessment & Plan Clinical Impression: MARKEA Serrano is a 78 y.o. female with history of HTN, Atrial Fib with chronic coumadin who fell night prior to admission on 06/29/12 with complaints of right hip pain. Workup revealed nondisplaced right subcapital femoral neck fracture and sodium 123 at admission. Coumadin reversed and patient underwent right hip hemi by Dr Charlann Boxer on 06/30/12. Post op PWB RLE and coumadin resumed. Did develop ABLA and was transfused with 2 unit PRBC. Has had problems voiding requiring cath for 850 cc and foley replaced yesterday. Patient transferred to CIR on 07/05/2012 .   Patient currently requires min with mobility secondary to muscle weakness, decreased cardiorespiratoy endurance, impaired timing and sequencing and unbalanced muscle activation and decreased standing balance, decreased balance strategies and difficulty maintaining precautions. Pt very anxious with  all mobility, has limited endurance with mobility. SpO2 >96% on room air with mobility, HR up to 132 with rest HR 111-125.  Prior to hospitalization, patient was independent with mobility and lived with Alone in a House home.  Home access is 1 (3 steps in front with rails)Stairs to enter.  Patient will benefit from skilled PT intervention to maximize safe functional mobility, minimize fall risk and decrease caregiver burden for planned discharge home with intermittent assist.  Anticipate patient will benefit from follow up [facility] at discharge.  PT - End of Session Endurance Deficit: Yes PT Assessment Rehab Potential: Good Barriers to Discharge: Decreased caregiver support PT Plan PT Frequency: 2-3 X/day, 60-90 minutes Estimated Length of Stay: 7-10 days PT Treatment/Interventions: Ambulation/gait training;Balance/vestibular training;Community reintegration;Discharge planning;DME/adaptive equipment instruction;Functional mobility training;Neuromuscular re-education;Pain management;Patient/family education;Psychosocial support;Stair training;Therapeutic Activities;Therapeutic Exercise;UE/LE Strength taining/ROM PT Recommendation Follow Up Recommendations: Home health PT;Outpatient PT (pending pt preference and transportation) Equipment Recommended: Rolling walker with 5" wheels;Wheelchair (measurements);Wheelchair cushion (measurements) (Wheelchair for longer distances)  PT Evaluation Precautions/Restrictions Precautions Precautions: Posterior Hip Restrictions RLE Weight Bearing: Partial weight bearing RLE Partial Weight Bearing Percentage or Pounds: 50%  Vital Signs Oxygen Therapy O2 Device: None (Room air) Pain  3/10 Rt. Hip, declined pain medication Home Living/Prior Functioning Home Living Lives With: Alone Available Help at Discharge: Family;Available PRN/intermittently Type of Home: House Home Access: Stairs to enter [employer] of Steps: 1 (3 steps in front with  rails) Entrance Stairs-Rails: None Home Layout: Two level;Able to live on main level with bedroom/bathroom Bathroom Shower/Tub: Walk-in shower;Door [employer] Toilet: Standard Bathroom Accessibility: Yes How Accessible: Accessible via walker Home Adaptive Equipment: Built-in  shower seat;Straight cane Additional Comments: Son is coming down to stay with pt for 1 week and longer if needed. Prior Function Level of Independence: Independent with transfers;Independent with basic ADLs;Independent with gait Able to Take Stairs?: Yes Driving: Yes Vocation: Retired Leisure: Hobbies-yes (Comment) Comments: Read, used to crochet but now limited by arthritis in hands, likes to bake, takes care of grandkids  Cognition Arousal/Alertness: Awake/alert Orientation Level: Oriented X4 Comments: Pt overly anxious with all mobility, nearly impulsive secondary to anxiety.  Sensation Sensation Light Touch: Appears Intact (bil. LEs)  Mobility Bed Mobility Bed Mobility: Supine to Sit Supine to Sit: 4: Min assist Supine to Sit Details (indicate cue type and reason): Poor efficiency, able to maintain precautions.  Sit to Supine: 4: Min assist Sit to Supine - Details (indicate cue type and reason): Assist for Rt. LE, cues for efficiency Transfers Sit to Stand: 4: Min assist Sit to Stand Details (indicate cue type and reason): Cues for Rt. LE placement for precautions and pain management. Cues for safe UE placement and locking brakes.  Stand to Sit: 4: Min assist Locomotion  Ambulation Ambulation: Yes Ambulation/Gait Assistance: 4: Min assist Ambulation Distance (Feet): 20 Feet Assistive device: Rolling walker Ambulation/Gait Assistance Details: Cues for weight bearing precautions, encouragement for controlled breathing - pt very very anxious.  Gait Gait Pattern: Step-to pattern;Decreased weight shift to right;Decreased step length - right Stairs / Additional Locomotion Stairs: No    Balance Balance Balance Assessed: Yes Static Sitting Balance Static Sitting - Balance Support: No upper extremity supported;Feet supported Static Sitting - Level of Assistance: 5: Stand by assistance Dynamic Standing Balance Dynamic Standing - Balance Support: Left upper extremity supported Dynamic Standing - Level of Assistance: 3: Mod assist;4: Min assist Dynamic Standing - Comments: Pt consistently needs min assist, 2 losses of balance while placing dentures that required mod assist to correct.  Extremity Assessment      RLE Assessment RLE Assessment: Exceptions to [facility] RLE Strength RLE Overall Strength Comments: Generalized deconditioning, grossly >3+/5 with exception of Rt. hip flexion which was not formally tested but noted to be functionally weak.  LLE Assessment LLE Assessment: Exceptions to [facility] LLE Strength LLE Overall Strength Comments: Generalized deconditioning, grossly >3+/5  See FIM for current functional status Refer to Care Plan for Long Term Goals  Skilled PT Standing balance performed during functional activity while standing at sink managing placing in dentures, up to mod assist for loss of balance with decreased UE support. Sit <> stands from wheelchair practicing R.t LE placement for precautions and pain management. Practiced diaphragmatic breathing however pt very impaired with and needs further practice, uses accessory muscles.   Recommendations for other services: None  Discharge Criteria: Patient will be discharged from PT if patient refuses treatment 3 consecutive times without medical reason, if treatment goals not met, if there is a change in medical status, if patient makes no progress towards goals or if patient is discharged from hospital.  The above assessment, treatment plan, treatment alternatives and goals were discussed and mutually agreed upon: by patient  [name] 07/06/2012, 12:52 PM

## 2012-07-06 NOTE — Progress Notes (Signed)
Subjective/Complaints: Restless last night due to being turned and woken up. Didn't sleep real well. Pain under reasonable control. A 12 point review of systems has been performed and if not noted above is otherwise negative.   Objective: Vital Signs: Blood pressure 146/85, pulse 88, temperature 97.8 F (36.6 C), temperature source Oral, resp. rate 18, weight 93.2 kg (205 lb 7.5 oz), SpO2 97.00%. Dg Chest 2 View  07/04/2012  *RADIOLOGY REPORT*  Clinical Data: Shortness of breath  CHEST - 2 VIEW  Comparison: 06/29/2012  Findings: Normal heart size and vascularity.  Low lung volumes evident.  Negative for CHF or pneumonia.  No collapse, consolidation, effusion or pneumothorax.  Degenerative changes of the left shoulder.  No significant interval change.  IMPRESSION: Low volume exam.  No acute process   Original Report Authenticated By: Judie Petit. Ruel Favors, M.D.     Basename 07/06/12 0420 07/05/12 0652  WBC 7.3 8.0  HGB 9.0* 9.2*  HCT 26.3* 27.0*  PLT 259 250    Basename 07/06/12 0420 07/05/12 0652  NA 129* 129*  K 4.2 4.3  CL 99 98  CO2 22 19  GLUCOSE 108* 119*  BUN 17 19  CREATININE 1.17* 1.20*  CALCIUM 8.7 8.5   CBG (last 3)  No results found for this basename: GLUCAP:3 in the last 72 hours  Wt Readings from Last 3 Encounters:  07/06/12 93.2 kg (205 lb 7.5 oz)  06/29/12 81.647 kg (180 lb)  06/29/12 81.647 kg (180 lb)    Physical Exam:  Constitutional: She is oriented to person, place, and time. She appears well-developed and well-nourished.  HENT:  Head: Normocephalic and atraumatic.  Eyes: Pupils are equal, round, and reactive to light.  Neck: Normal range of motion.  Cardiovascular: Tachycardia present. No murmur, irregular  Pulmonary/Chest: Effort normal and breath sounds normal. No wheezes, no distress  Abdominal: Normal appearance and bowel sounds are normal. She exhibits no distension.  Musculoskeletal: She exhibits edema (RLE at thigh/shin).  Right 2nd and 3rd  atrophied digits. Right leg limited in movement due to pain proximally. LLE intact for motor function. UE's grossly intact except for intrinsic weakness in the right hand Neurological: She is alert and oriented to person, place, and time.  Skin: Skin is warm and dry.  Surgical dressing right hip. Hip with bruising, probably a hematoma associated with wound also   Assessment/Plan: 1. Functional deficits secondary to right femoral neck fx which require 3+ hours per day of interdisciplinary therapy in a comprehensive inpatient rehab setting. Physiatrist is providing close team supervision and 24 hour management of active medical problems listed below. Physiatrist and rehab team continue to assess barriers to discharge/monitor patient progress toward functional and medical goals. FIM:                   Comprehension Comprehension Mode: Auditory Comprehension: 5-Understands complex 90% of the time/Cues < 10% of the time  Expression Expression Mode: Verbal Expression: 5-Expresses complex 90% of the time/cues < 10% of the time  Social Interaction Social Interaction: 6-Interacts appropriately with others with medication or extra time (anti-anxiety, antidepressant).  Problem Solving Problem Solving: 5-Solves complex 90% of the time/cues < 10% of the time  Memory Memory: 5-Recognizes or recalls 90% of the time/requires cueing < 10% of the time  Medical Problem List and Plan:  1. DVT Prophylaxis/Anticoagulation: Pharmaceutical: Coumadin  2. Pain Management: control reasonable.  3. Mood: motivated without signs of distress.  4. Neuropsych: This patient is capable of making decisions  on his/her own behalf.  5. Atrial fibrillation with RVR: monitor HR with bid checks. Continue coreg, digoxin, and flecainide.  6. UTI: cipro D#2. Culture pending. 7. Urinary retention: Discontinue foley and start voiding trial. Get pt to [facility] to void and cath for PVR greater than 350 cc.  8. ABLA:  continue iron supplement. Likely hematoma at wound site. Recheck tomorrow  9. Chronic hyponatremia?: Na improved to 129. Will monitor for now. Follow up again in the am. 10. Fluid overload: Improved past diuretics. Will check daily weights and monitor for signs of overload.  11. Wound care: allevyn dressing and pressure relief measures to sacrum  -can use allevyn dressing for hip    LOS (Days) 1 A FACE TO FACE EVALUATION WAS PERFORMED  SWARTZ,ZACHARY T 07/06/2012, 8:17 AM

## 2012-07-06 NOTE — Progress Notes (Signed)
Physical Therapy Session Note  Patient Details  Name: Madeline Serrano MRN: [id number] Date of Birth: 23-Dec-1929  Today's Date: 07/06/2012 Time: 1345-1430 Time Calculation (min): 45 min  Short Term Goals: Week 1:  PT Short Term Goal 1 (Week 1): = long term goals  Skilled Therapeutic Interventions/Progress Updates:  Pt sore from all her therapies today but willing to work some more. SpO2 >93% throughout session, HR up to 126 bpm. - Step trainging backwards with RW, Performed with +2 total assist for safety, mod assist for performance.  - Rt. LE mat exercises: heel slides, hip abduction/adduction, quad sets 2 x 10 reps each with heel unweighted for decreased resistance.   - Bed mobility x 2 reps practicing efficiency and maintaining hip precautions - Transfers performed with min assist nearly supervision. Cues needed for sequencing.   Pt able to perform weight bearing precautions well however requires min/mod verbal cues for posterior hip precautions (particularly internal rotation).  Therapy Documentation Precautions:  Precautions Precautions: Posterior Hip Restrictions Weight Bearing Restrictions: Yes RLE Weight Bearing: Partial weight bearing RLE Partial Weight Bearing Percentage or Pounds: 50% Pain: Pain Assessment Pain Assessment: 0-10 Pain Score:   4 Pain Type: Surgical pain Pain Location: Hip Pain Orientation: Right Pain Descriptors: Aching Pain Onset: On-going Pain Intervention(s): RN made aware;Repositioned Locomotion : Ambulation Ambulation/Gait Assistance: 4: Min assist   See FIM for current functional status  Therapy/Group: Individual Therapy  [name] 07/06/2012, 5:32 PM

## 2012-07-06 NOTE — Progress Notes (Signed)
[facility] Individual Statement of Services  Patient Name:  Madeline Serrano  Date:  07/06/2012  Welcome to the [facility]patient Rehabilitation Center.  Our goal is to provide you with an individualized program based on your diagnosis and situation, designed to meet your specific needs.  With this comprehensive rehabilitation program, you will be expected to participate in at least 3 hours of rehabilitation therapies Monday-Friday, with modified therapy programming on the weekends.  Your rehabilitation program will include the following services:  Physical Therapy (PT), Occupational Therapy (OT), 24 hour per day rehabilitation nursing, Therapeutic Recreaction (TR), Case Management (Social Worker), Rehabilitation Medicine, Nutrition Services and Pharmacy Services  Weekly team conferences will be held on Tuesdays to discuss your progress.  Your  Social Worker will talk with you frequently to get your input and to update you on team discussions.  Team conferences with you and your family in attendance may also be held.  Expected length of stay: 10-14 days  Overall anticipated outcome: modified independent  Depending on your progress and recovery, your program may change.  Your  Social Worker will coordinate services and will keep you informed of any changes.  Your  Social Worker's name and contact numbers are listed  below.  The following services may also be recommended but are not provided by the Inpatient Rehabilitation Center:   Driving Evaluations  Home Health Rehabiltiation Services  Outpatient Rehabilitatation [facility]  Vocational Rehabilitation   Arrangements will be made to provide these services after discharge if needed.  Arrangements include referral to agencies that provide these services.  Your insurance has been verified to be:  Medicare and BCBS Your primary doctor is:  Dr. Clelia Croft  Pertinent information will be shared with your doctor and your insurance  company.  Social Worker:  [place], [place] [phone] or [phone]  Information discussed with and copy given to patient by: Amada Jupiter, 07/06/2012, 3:44 PM

## 2012-07-07 ENCOUNTER — Inpatient Hospital Stay (HOSPITAL_COMMUNITY): Payer: Medicare Other | Admitting: Physical Therapy

## 2012-07-07 ENCOUNTER — Inpatient Hospital Stay (HOSPITAL_COMMUNITY): Payer: Medicare Other | Admitting: Occupational Therapy

## 2012-07-07 DIAGNOSIS — S72009A Fracture of unspecified part of neck of unspecified femur, initial encounter for closed fracture: Secondary | ICD-10-CM

## 2012-07-07 DIAGNOSIS — I509 Heart failure, unspecified: Secondary | ICD-10-CM

## 2012-07-07 DIAGNOSIS — Z5189 Encounter for other specified aftercare: Secondary | ICD-10-CM

## 2012-07-07 DIAGNOSIS — I4891 Unspecified atrial fibrillation: Secondary | ICD-10-CM

## 2012-07-07 DIAGNOSIS — E871 Hypo-osmolality and hyponatremia: Secondary | ICD-10-CM

## 2012-07-07 DIAGNOSIS — D62 Acute posthemorrhagic anemia: Secondary | ICD-10-CM

## 2012-07-07 DIAGNOSIS — W19XXXA Unspecified fall, initial encounter: Secondary | ICD-10-CM

## 2012-07-07 LAB — CBC
MCH: 30.2 pg (ref 26.0–34.0)
MCHC: 33.5 g/dL (ref 30.0–36.0)
MCV: 90.2 fL (ref 78.0–100.0)
Platelets: 297 10*3/uL (ref 150–400)
RBC: 3.05 MIL/uL — ABNORMAL LOW (ref 3.87–5.11)

## 2012-07-07 LAB — BASIC METABOLIC PANEL
BUN: 19 mg/dL (ref 6–23)
Creatinine, Ser: 1.2 mg/dL — ABNORMAL HIGH (ref 0.50–1.10)
GFR calc Af Amer: 48 mL/min — ABNORMAL LOW (ref >90)
GFR calc non Af Amer: 42 mL/min — ABNORMAL LOW (ref >90)
Glucose, Bld: 116 mg/dL — ABNORMAL HIGH (ref 70–99)
Potassium: 4.4 mEq/L (ref 3.5–5.1)

## 2012-07-07 LAB — PROTIME-INR: Prothrombin Time: 32.2 seconds — ABNORMAL HIGH (ref 11.6–15.2)

## 2012-07-07 LAB — URINE CULTURE: Culture: NO GROWTH

## 2012-07-07 MED ORDER — FUROSEMIDE 20 MG PO TABS
20.0000 mg | ORAL_TABLET | Freq: Once | ORAL | Status: DC
  Filled 2012-07-07: qty 1

## 2012-07-07 MED ORDER — TRIAMTERENE-HCTZ 37.5-25 MG PO TABS
1.0000 | ORAL_TABLET | Freq: Once | ORAL | Status: AC
  Administered 2012-07-07: 1 via ORAL
  Filled 2012-07-07: qty 1

## 2012-07-07 NOTE — Progress Notes (Signed)
Occupational Therapy Session Note  Patient Details  Name: Madeline Serrano MRN: [id number] Date of Birth: March 21, 1930  Today's Date: 07/07/2012 Time: 0900-1000 Time Calculation (min): 60 min  Short Term Goals: Week 1:  OT Short Term Goal 1 (Week 1): Pt will perform LB bathing with min assist using AE. OT Short Term Goal 2 (Week 1): Pt will perform LB dressing using AE with min assist. OT Short Term Goal 3 (Week 1): Pt will perfrom toilt transfers maintaining PWBing status with supervision. OT Short Term Goal 4 (Week 1): Pt will perform walk-in shower transfer with min assist to shower seat using RW.  Skilled Therapeutic Interventions/Progress Updates:  Self care retraining to include sponge bath and dressing.  Focus session on activity tolerance with need for multiple rest breaks, sit><stand technique and dynamic standing balance to limit weight onto RLE and adhere to THP, use of AE.  Patient required mod cues to use reacher and sock aid yet reports she will ask her family to purchase a hip kit from gift shop.  Therapy Documentation Precautions:  Precautions Precautions: Posterior Hip Restrictions Weight Bearing Restrictions: Yes RLE Weight Bearing: Partial weight bearing RLE Partial Weight Bearing Percentage or Pounds: 50% Pain: Denies pain See FIM for current functional status  Therapy/Group: Individual Therapy  Early Ord 07/07/2012, 12:20 PM

## 2012-07-07 NOTE — Progress Notes (Signed)
Physical Therapy Session Note  Patient Details  Name: MELANIE OPENSHAW MRN: [id number] Date of Birth: [birth year]  Today's Date: 07/07/2012 Time: 1002-1032 Time Calculation (min): 30 min  Short Term Goals: Week 1:  PT Short Term Goal 1 (Week 1): = long term goals  Skilled Therapeutic Interventions/Progress Updates:Pt performed sit-stand with min assist and verbal cues for technique from w/c and mat.  Ambulated 40x2 with RW and min guard assist with cues for step length and walker placement.  Pt sit-supine on mat with min assist for RLE and mod assist for supine-sit with assist needed at trunk.  Pt performed ankle pumps, quad sets, Short arc quad, heel slides and SLR and RLE actively with active assist for SLR times 10-15 reps.      Therapy Documentation Precautions: Pt able to verbalize 3/3 hip precautions. Precautions Precautions: Posterior Hip Restrictions Weight Bearing Restrictions: Yes RLE Weight Bearing: Partial weight bearing RLE Partial Weight Bearing Percentage or Pounds: 50%     Pain: 2/10 at rest and 6/10 with SLR.     See FIM for current functional status  Therapy/Group: Individual Therapy  Newell Coral 07/07/2012, 12:41 PM

## 2012-07-07 NOTE — Progress Notes (Signed)
Physical Therapy Session Note  Patient Details  Name: Madeline Serrano MRN: [id number] Date of Birth: 10-24-1929  Today's Date: 07/07/2012 Time: 4010-2725 Time Calculation (min): 43 min  Short Term Goals: Week 1:  PT Short Term Goal 1 (Week 1): = long term goals     Therapy Documentation Precautions:  Precautions Precautions: Posterior Hip Restrictions Weight Bearing Restrictions: Yes RLE Weight Bearing: Partial weight bearing RLE Partial Weight Bearing Percentage or Pounds: 50%   Vital Signs: EHR 80-85 with SaO2 >92%.     Pain:  Pt reports 2/10 pain in RLE and sporadic pain in L knee with standing exercises.  Reports history of L knee pain PTA with increased activity.   Locomotion : Ambulation-Pt ambulated x 35 feet with min guard assist and RW with cues for upright posture and sequence. Ambulation/Gait Assistance: 4: Min guard  Trunk/Postural Assessment :   Exercises:  Performed R LE standing exercises x 10 reps, AROM of hip flexion, hip extension, hip abduction and knee flexion.   Other Treatments: Sit-supine with min assist with HOB flat and no rails.  Pt needed assist for RLE and trunk.  Cues to reposition in bed.  See FIM for current functional status  Therapy/Group: Individual Therapy  Newell Coral 07/07/2012, 2:29 PM

## 2012-07-07 NOTE — Progress Notes (Signed)
ANTICOAGULATION CONSULT NOTE - F/U Consult  Pharmacy Consult for coumadin Indication: atrial fibrillation  Allergies  Allergen Reactions  . Sulfa Antibiotics Other (See Comments)    [name]  . Celebrex (Celecoxib) Swelling  . Clarithromycin Swelling  . Statins Other (See Comments)    Hair fell out  . Vioxx (Rofecoxib) Other (See Comments)    Renal problems  . Penicillins Rash    Patient Measurements: Wt=81.6kg  Vital Signs: Temp: 97.8 F (36.6 C) (10/04 0518) Temp src: Oral (10/04 0518) BP: 149/100 mmHg (10/04 0518) Pulse Rate: 91  (10/04 0730)  Labs:  Basename 07/07/12 0645 07/06/12 1250 07/06/12 0420 07/05/12 0652  HGB 9.2* -- 9.0* --  HCT 27.5* -- 26.3* 27.0*  PLT 297 -- 259 250  APTT -- -- -- --  LABPROT 32.2* 30.2* -- 28.2*  INR 3.37* 3.09* -- 2.82*  HEPARINUNFRC -- -- -- --  CREATININE 1.20* -- 1.17* 1.20*  CKTOTAL -- -- -- --  CKMB -- -- -- --  TROPONINI -- -- -- --    The CrCl is unknown because both a height and weight (above a minimum accepted value) are required for this calculation.   Medical History: Past Medical History  Diagnosis Date  . A-fib   . Hypertension   . High cholesterol   . Macular degeneration     Medications:  Prescriptions prior to admission  Medication Sig Dispense Refill  . calcium carbonate (OS-CAL) 600 MG TABS Take 600 mg by mouth 3 (three) times daily with meals.      . carvedilol (COREG) 12.5 MG tablet Take 18.75 mg by mouth 2 (two) times daily with a meal.       . estrogen, conjugated,-medroxyprogesterone (PREMPRO) 0.3-1.5 MG per tablet Take 1 tablet by mouth daily. 5 days a week Does not take on Tuesday and Friday      . fexofenadine (ALLEGRA) 180 MG tablet Take 180 mg by mouth daily as needed. For allergies       . fish oil-omega-3 fatty acids 1000 MG capsule Take 1 g by mouth daily.       . flecainide (TAMBOCOR) 50 MG tablet Take 50 mg by mouth 2 (two) times daily.        . lansoprazole (PREVACID) 15 MG  capsule Take 15 mg by mouth every other day.      . Multiple Vitamins-Minerals (ICAPS) TABS Take 1 tablet by mouth daily.      Marland Kitchen olmesartan (BENICAR) 20 MG tablet Take 20 mg by mouth daily.      Marland Kitchen pyridoxine (B-6) 200 MG tablet Take 200 mg by mouth 2 (two) times daily.       . rosuvastatin (CRESTOR) 10 MG tablet Take 5 mg by mouth every Monday, Wednesday, and Friday.       . tiotropium (SPIRIVA) 18 MCG inhalation capsule Place 18 mcg into inhaler and inhale daily.      Marland Kitchen triamterene-hydrochlorothiazide (MAXZIDE) 75-50 MG per tablet Take 0.5 tablets by mouth daily.      Marland Kitchen warfarin (COUMADIN) 4 MG tablet Take 2-4 mg by mouth daily. Takes 1 tablet on Monday, and Friday Takes 1/2 tablet the rest of the week        Assessment: 78 yo female s/p R hip arthroplastyand also w/ afib on coumadin PTA now transferred to rehab. INR 3.37 and home dose is 2mg  STWTS and 4mg  MF. INR has been trending up and patient noted on cipro (d/c'd 10/4). Iron supplement for anemia. Hgb 9.2.  Cards: Afib, HTN. BP 149/100,91 on Coreg, Digoxin, Flecainide. Patient feeling "bloated." Chronic hyponatremia noted with Na down to 127.  Goal of Therapy:  INR 2-3 Monitor platelets by anticoagulation protocol: Yes   Plan:  - Hold coumadin again tonight with increased INR.  - Continue daily INR - Coumadin discharge education has been completed  Arali Somera S. Merilynn Finland, PharmD, [facility] Clinical Staff Pharmacist Pager [phone]  07/07/2012 12:50 PM

## 2012-07-07 NOTE — Progress Notes (Signed)
Physical Therapy Session Note  Patient Details  Name: Madeline Serrano MRN: [id number] Date of Birth: Jan 18, 1930  Today's Date: 07/07/2012 Time: 0730-0830 Time Calculation (min): 60 min  Short Term Goals: Week 1:  PT Short Term Goal 1 (Week 1): = long term goals  Skilled Therapeutic Interventions/Progress Updates:    Ankle pumps, quad sets, glute sets, heel slides x 10 reps in supine for warm up. Bed mobility with min/mod assist and rails, use of pillow to maintain precautions. Ambulation to sink with RW, min-guard assist cues for precautions, balance at sink with elbows propped on sink for balance while performing self care up to min assist. Gait x 37', 43' with RW. Extended time for recovery. Standing single limb stance balance activity working with decreased bil. UE support tapping cone with Rt. Foot. Verbal cues needed throughout for slowed controlled breathing. HR up to 91, SpO2 >95% throughout treatment on room air however pt with dyspnea frequently.   Therapy Documentation Precautions:  Precautions Precautions: Posterior Hip Restrictions Weight Bearing Restrictions: Yes RLE Weight Bearing: Partial weight bearing RLE Partial Weight Bearing Percentage or Pounds: 50% Pain:  3/10 Rt. Hip with mobility, pt declined medication. Allowed rest as needed.  See FIM for current functional status  Therapy/Group: Individual Therapy  [name] 07/07/2012, 12:22 PM

## 2012-07-07 NOTE — Progress Notes (Signed)
Subjective/Complaints: Feels "swollen" clothes are tight. No sob beyond baseline A 12 point review of systems has been performed and if not noted above is otherwise negative.   Objective: Vital Signs: Blood pressure 149/100, pulse 120, temperature 97.8 F (36.6 C), temperature source Oral, resp. rate 20, weight 92.4 kg (203 lb 11.3 oz), SpO2 100.00%. No results found.  Basename 07/07/12 0645 07/06/12 0420  WBC 7.7 7.3  HGB 9.2* 9.0*  HCT 27.5* 26.3*  PLT 297 259    Basename 07/07/12 0645 07/06/12 0420  NA 127* 129*  K 4.4 4.2  CL 96 99  CO2 22 22  GLUCOSE 116* 108*  BUN 19 17  CREATININE 1.20* 1.17*  CALCIUM 8.9 8.7   CBG (last 3)  No results found for this basename: GLUCAP:3 in the last 72 hours  Wt Readings from Last 3 Encounters:  07/07/12 92.4 kg (203 lb 11.3 oz)  06/29/12 81.647 kg (180 lb)  06/29/12 81.647 kg (180 lb)    Physical Exam:  Constitutional: She is oriented to person, place, and time. She appears well-developed and well-nourished.  HENT:  Head: Normocephalic and atraumatic.  Eyes: Pupils are equal, round, and reactive to light.  Neck: Normal range of motion.  Cardiovascular: Tachycardia present. No murmur, irregular  Pulmonary/Chest: Effort normal and breath sounds normal. No wheezes, no distress  Abdominal: Normal appearance and bowel sounds are normal. She exhibits no distension.  Musculoskeletal: She exhibits edema (RLE at thigh/shin).  Right 2nd and 3rd atrophied digits. Right leg limited in movement due to pain proximally. LLE intact for motor function. UE's grossly intact except for intrinsic weakness in the right hand Neurological: She is alert and oriented to person, place, and time.  Skin: Skin is warm and dry.  Surgical dressing right hip. Hip with bruising, probably a hematoma associated with wound also   Assessment/Plan: 1. Functional deficits secondary to right femoral neck fx which require 3+ hours per day of interdisciplinary  therapy in a comprehensive inpatient rehab setting. Physiatrist is providing close team supervision and 24 hour management of active medical problems listed below. Physiatrist and rehab team continue to assess barriers to discharge/monitor patient progress toward functional and medical goals. FIM: FIM - Bathing Bathing Steps Patient Completed: Chest;Right Arm;Left Arm;Abdomen;Right upper leg;Left upper leg Bathing: 3: Mod-Patient completes 5-7 39f 10 parts or 50-74%  FIM - Upper Body Dressing/Undressing Upper body dressing/undressing steps patient completed: Thread/unthread right bra strap;Thread/unthread left bra strap;Thread/unthread right sleeve of pullover shirt/dresss;Thread/unthread left sleeve of pullover shirt/dress;Put head through opening of pull over shirt/dress;Pull shirt over trunk Upper body dressing/undressing: 4: Min-Patient completed 75 plus % of tasks FIM - Lower Body Dressing/Undressing Lower body dressing/undressing: 1: Total-Patient completed less than 25% of tasks     FIM - Diplomatic Services operational officer Devices: Psychiatrist Transfers: 4-To toilet/BSC: Min A (steadying Pt. > 75%);3-From toilet/BSC: Mod A (lift or lower assist)  FIM - Bed/Chair Transfer Bed/Chair Transfer: 4: Supine > Sit: Min A (steadying Pt. > 75%/lift 1 leg);3: Sit > Supine: Mod A (lifting assist/Pt. 50-74%/lift 2 legs);4: Bed > Chair or W/C: Min A (steadying Pt. > 75%);4: Chair or W/C > Bed: Min A (steadying Pt. > 75%)  FIM - Locomotion: Wheelchair Locomotion: Wheelchair: 1: Travels less than 50 ft with minimal assistance (Pt.>75%) FIM - Locomotion: Ambulation Locomotion: Ambulation Assistive Devices: Designer, industrial/product Ambulation/Gait Assistance: 4: Min assist Locomotion: Ambulation: 1: Travels less than 50 ft with minimal assistance (Pt.>75%)  Comprehension Comprehension Mode: Auditory Comprehension:  5-Follows basic conversation/direction: With no  assist  Expression Expression Mode: Verbal Expression: 5-Expresses basic needs/ideas: With no assist  Social Interaction Social Interaction: 6-Interacts appropriately with others with medication or extra time (anti-anxiety, antidepressant).  Problem Solving Problem Solving: 5-Solves basic problems: With no assist  Memory Memory: 5-Recognizes or recalls 90% of the time/requires cueing < 10% of the time  Medical Problem List and Plan:  1. DVT Prophylaxis/Anticoagulation: Pharmaceutical: Coumadin  2. Pain Management: control reasonable.  3. Mood: motivated without signs of distress.  4. Neuropsych: This patient is capable of making decisions on his/her own behalf.  5. Atrial fibrillation with RVR: monitor HR with bid checks. Continue coreg, digoxin, and flecainide.  6. UTI: cipro D#2. cx negative--dc 7. Urinary retention: Discontinue foley and start voiding trial. Get pt to [facility] to void and cath for PVR greater than 350 cc.  8. ABLA: continue iron supplement. Likely hematoma at wound site. Recheck tomorrow  9. Chronic hyponatremia?: Na now at 127 again..  Follow up again in the am. See below 10. Fluid overload: Improved post diuretics. Weights are trending down, but she still feels "bloated" and tells me her weight is 20lbs higher than what it was at her doctor's office 2 weeks ago. Her clothes are also very tight  -give a dose of lasix today and follow labs, weights closely 11. Wound care: allevyn dressing and pressure relief measures to sacrum  -can use allevyn dressing for hip    LOS (Days) 2 A FACE TO FACE EVALUATION WAS PERFORMED  Madeline Serrano T 07/07/2012, 8:44 AM

## 2012-07-07 NOTE — Progress Notes (Signed)
Patient information reviewed and entered into eRehab system by Kaycee Mcgaugh, RN, CRRN, PPS Coordinator.  Information including medical coding and functional independence measure will be reviewed and updated through discharge.     Per nursing patient was given "Data Collection Information Summary for Patients in Inpatient Rehabilitation Facilities with attached "Privacy Act Statement-Health Care Records" upon admission.  

## 2012-07-08 ENCOUNTER — Inpatient Hospital Stay (HOSPITAL_COMMUNITY): Payer: Medicare Other | Admitting: *Deleted

## 2012-07-08 LAB — BASIC METABOLIC PANEL
BUN: 21 mg/dL (ref 6–23)
CO2: 22 mEq/L (ref 19–32)
Calcium: 9.4 mg/dL (ref 8.4–10.5)
Glucose, Bld: 108 mg/dL — ABNORMAL HIGH (ref 70–99)
Potassium: 4.8 mEq/L (ref 3.5–5.1)
Sodium: 127 mEq/L — ABNORMAL LOW (ref 135–145)

## 2012-07-08 MED ORDER — WARFARIN SODIUM 2 MG PO TABS
2.0000 mg | ORAL_TABLET | Freq: Once | ORAL | Status: AC
Start: 2012-07-08 — End: 2012-07-08
  Administered 2012-07-08: 2 mg via ORAL
  Filled 2012-07-08: qty 1

## 2012-07-08 NOTE — Progress Notes (Signed)
Patient ID: VERTA Serrano, female   DOB: [birth year], 78 y.o.   MRN: [id number] Subjective/Complaints: 10/5.  Afebrile.  Asymptomatic without concerns or complaints. Minimal discomfort only ;Patient has stable anemia and hyponatremia. INR slightly elevated yesterday at 3.37 and Coumadin therapy held A 12 point review of systems has been performed and if not noted above is otherwise negative.  Chest clear to auscultation; cardiovascular normal heart sounds ; normal rate no murmurs; abdomen soft flat nontender no organomegaly no distention; extremities no edema  Lab Results  Component Value Date   INR 1.99* 07/08/2012   INR 3.37* 07/07/2012   INR 3.09* 07/06/2012   Objective: Vital Signs: Blood pressure 132/79, pulse 64, temperature 98.4 F (36.9 C), temperature source Oral, resp. rate 18, weight 91.9 kg (202 lb 9.6 oz), SpO2 96.00%. No results found.  Basename 07/07/12 0645 07/06/12 0420  WBC 7.7 7.3  HGB 9.2* 9.0*  HCT 27.5* 26.3*  PLT 297 259    Basename 07/08/12 0645 07/07/12 0645  NA 127* 127*  K 4.8 4.4  CL 96 96  CO2 22 22  GLUCOSE 108* 116*  BUN 21 19  CREATININE 1.30* 1.20*  CALCIUM 9.4 8.9   CBG (last 3)  No results found for this basename: GLUCAP:3 in the last 72 hours  Wt Readings from Last 3 Encounters:  07/08/12 91.9 kg (202 lb 9.6 oz)  06/29/12 81.647 kg (180 lb)  06/29/12 81.647 kg (180 lb)    Physical Exam:  Constitutional: She is oriented to person, place, and time. She appears well-developed and well-nourished.  HENT:  Head: Normocephalic and atraumatic.  Eyes: Pupils are equal, round, and reactive to light.  Neck: Normal range of motion.  Cardiovascular: Tachycardia present. No murmur, irregular  Pulmonary/Chest: Effort normal and breath sounds normal. No wheezes, no distress  Abdominal: Normal appearance and bowel sounds are normal. She exhibits no distension.  Musculoskeletal: She exhibits edema (RLE at thigh/shin).  Right 2nd and 3rd atrophied  digits. Right leg limited in movement due to pain proximally. LLE intact for motor function. UE's grossly intact except for intrinsic weakness in the right hand Neurological: She is alert and oriented to person, place, and time.  Skin: Skin is warm and dry.  Surgical dressing right hip. Hip with bruising, probably a hematoma associated with wound also   Assessment/Plan: 1. Functional deficits secondary to right femoral neck fx which require 3+ hours per day of interdisciplinary therapy in a comprehensive inpatient rehab setting. Physiatrist is providing close team supervision and 24 hour management of active medical problems listed below. Physiatrist and rehab team continue to assess barriers to discharge/monitor patient progress toward functional and medical goals. FIM: FIM - Bathing Bathing Steps Patient Completed: Chest;Right Arm;Left Arm;Abdomen;Right upper leg;Left upper leg;Front perineal area Bathing: 3: Mod-Patient completes 5-7 58f 10 parts or 50-74%  FIM - Upper Body Dressing/Undressing Upper body dressing/undressing steps patient completed: Thread/unthread right sleeve of pullover shirt/dresss;Thread/unthread left sleeve of pullover shirt/dress;Put head through opening of pull over shirt/dress;Pull shirt over trunk (slept in her bra and declined to remove) Upper body dressing/undressing: 5: Set-up assist to: Obtain clothing/put away FIM - Lower Body Dressing/Undressing Lower body dressing/undressing steps patient completed: Thread/unthread left underwear leg;Thread/unthread left pants leg;Thread/unthread right pants leg;Don/Doff left sock Lower body dressing/undressing: 2: Max-Patient completed 25-49% of tasks     FIM - Diplomatic Services operational officer Devices: Bedside commode Toilet Transfers: 4-To toilet/BSC: Min A (steadying Pt. > 75%);3-From toilet/BSC: Mod A (lift or lower assist)  FIM - Banker Devices: Environmental consultant;Bed  rails;Arm rests Bed/Chair Transfer: 5: Supine > Sit: Supervision (verbal cues/safety issues);4: Chair or W/C > Bed: Min A (steadying Pt. > 75%);5: Sit > Supine: Supervision (verbal cues/safety issues);4: Bed > Chair or W/C: Min A (steadying Pt. > 75%)  FIM - Locomotion: Wheelchair Locomotion: Wheelchair: 5: Travels 150 ft or more: maneuvers on rugs and over door sills with supervision, cueing or coaxing FIM - Locomotion: Ambulation Locomotion: Ambulation Assistive Devices: Designer, industrial/product Ambulation/Gait Assistance: 4: Min guard Locomotion: Ambulation: 2: Travels 50 - 149 ft with minimal assistance (Pt.>75%)  Comprehension Comprehension Mode: Auditory Comprehension: 5-Follows basic conversation/direction: With no assist  Expression Expression Mode: Verbal Expression: 5-Expresses basic needs/ideas: With no assist  Social Interaction Social Interaction: 6-Interacts appropriately with others with medication or extra time (anti-anxiety, antidepressant).  Problem Solving Problem Solving: 5-Solves basic problems: With no assist  Memory Memory: 5-Recognizes or recalls 90% of the time/requires cueing < 10% of the time  Medical Problem List and Plan:  1. DVT Prophylaxis/Anticoagulation: Pharmaceutical: Coumadin  2. Pain Management: control reasonable.  3. Mood: motivated without signs of distress.  4. Neuropsych: This patient is capable of making decisions on his/her own behalf.  5. Atrial fibrillation with RVR: monitor HR with bid checks. Continue coreg, digoxin, and flecainide.  6. UTI: cipro D#2. cx negative--dc 7. Urinary retention: Discontinue foley and start voiding trial. Get pt to [facility] to void and cath for PVR greater than 350 cc.  8. ABLA: continue iron supplement. Likely hematoma at wound site. Recheck tomorrow  9. Chronic hyponatremia?: Na now at 127 again..  Follow up again in the am. See below 10. Fluid overload: Improved post diuretics. Weights are trending down, but she  still feels "bloated" and tells me her weight is 20lbs higher than what it was at her doctor's office 2 weeks ago. Her clothes are also very tight  -give a dose of lasix today and follow labs, weights closely 11. Wound care: allevyn dressing and pressure relief measures to sacrum  -can use allevyn dressing for hip    LOS (Days) 3 A FACE TO FACE EVALUATION WAS PERFORMED  Rogelia Boga 07/08/2012, 8:39 AM

## 2012-07-08 NOTE — Progress Notes (Signed)
ANTICOAGULATION CONSULT NOTE - F/U Consult  Pharmacy Consult for coumadin Indication: atrial fibrillation  Allergies  Allergen Reactions  . Sulfa Antibiotics Other (See Comments)    [name]  . Celebrex (Celecoxib) Swelling  . Clarithromycin Swelling  . Statins Other (See Comments)    Hair fell out  . Vioxx (Rofecoxib) Other (See Comments)    Renal problems  . Penicillins Rash    Patient Measurements: Wt=81.6kg  Vital Signs: Temp: 98.4 F (36.9 C) (10/05 0551) Temp src: Oral (10/05 0551) BP: 132/79 mmHg (10/05 0551) Pulse Rate: 64  (10/05 0551)  Labs:  Basename 07/08/12 0645 07/07/12 0645 07/06/12 1250 07/06/12 0420  HGB -- 9.2* -- 9.0*  HCT -- 27.5* -- 26.3*  PLT -- 297 -- 259  APTT -- -- -- --  LABPROT 21.8* 32.2* 30.2* --  INR 1.99* 3.37* 3.09* --  HEPARINUNFRC -- -- -- --  CREATININE 1.30* 1.20* -- 1.17*  CKTOTAL -- -- -- --  CKMB -- -- -- --  TROPONINI -- -- -- --    The CrCl is unknown because both a height and weight (above a minimum accepted value) are required for this calculation.   Medical History: Past Medical History  Diagnosis Date  . A-fib   . Hypertension   . High cholesterol   . Macular degeneration     Medications:  Prescriptions prior to admission  Medication Sig Dispense Refill  . calcium carbonate (OS-CAL) 600 MG TABS Take 600 mg by mouth 3 (three) times daily with meals.      . carvedilol (COREG) 12.5 MG tablet Take 18.75 mg by mouth 2 (two) times daily with a meal.       . estrogen, conjugated,-medroxyprogesterone (PREMPRO) 0.3-1.5 MG per tablet Take 1 tablet by mouth daily. 5 days a week Does not take on Tuesday and Friday      . fexofenadine (ALLEGRA) 180 MG tablet Take 180 mg by mouth daily as needed. For allergies       . fish oil-omega-3 fatty acids 1000 MG capsule Take 1 g by mouth daily.       . flecainide (TAMBOCOR) 50 MG tablet Take 50 mg by mouth 2 (two) times daily.        . lansoprazole (PREVACID) 15 MG  capsule Take 15 mg by mouth every other day.      . Multiple Vitamins-Minerals (ICAPS) TABS Take 1 tablet by mouth daily.      Marland Kitchen olmesartan (BENICAR) 20 MG tablet Take 20 mg by mouth daily.      Marland Kitchen pyridoxine (B-6) 200 MG tablet Take 200 mg by mouth 2 (two) times daily.       . rosuvastatin (CRESTOR) 10 MG tablet Take 5 mg by mouth every Monday, Wednesday, and Friday.       . tiotropium (SPIRIVA) 18 MCG inhalation capsule Place 18 mcg into inhaler and inhale daily.      Marland Kitchen triamterene-hydrochlorothiazide (MAXZIDE) 75-50 MG per tablet Take 0.5 tablets by mouth daily.      Marland Kitchen warfarin (COUMADIN) 4 MG tablet Take 2-4 mg by mouth daily. Takes 1 tablet on Monday, and Friday Takes 1/2 tablet the rest of the week        Assessment: 78 yo female s/p R hip arthroplasty and also w/ afib on coumadin PTA now transferred to rehab. Home Coumadin dose is 2mg  STWTS and 4mg  MF. INR has been trending up and patient noted on cipro (d/c'd 10/4). Iron supplement for anemia. Hgb 9.2. INR  3.37 to 1.99 overnight (no Coumadin since 10/1)  Cards: Afib, HTN. BP 132/79,64 on Coreg, Digoxin, Flecainide. Patient feeling "bloated." Chronic hyponatremia noted with Na down to 127.  Goal of Therapy:  INR 2-3 Monitor platelets by anticoagulation protocol: Yes   Plan:  - Coumadin 2mg  po x 1 (very sensitive to low doses) - Continue daily INR - Coumadin discharge education has been completed  Shantay Sonn S. Merilynn Finland, PharmD, [facility] Clinical Staff Pharmacist Pager [phone]  07/08/2012 11:10 AM

## 2012-07-08 NOTE — Progress Notes (Signed)
Physical Therapy Session Note  Patient Details  Name: Madeline Serrano MRN: [id number] Date of Birth: 1930-07-31  Today's Date: 07/08/2012 Time: 7829-5621 Time Calculation (min): 45 min  Short Term Goals: Week 1:  PT Short Term Goal 1 (Week 1): = long term goals  Skilled Therapeutic Interventions/Progress Updates:    BLE there-ex for strength to aid mobility. L knee buckling in SLS which pt does report has occurred before and sometimes she wraps it in an ACE bandage.  Performed standing hip flexion, seated LAQ, supine heelslides, SAQ, abd/add 10 x 2 R and same with L except standing hip flexion and had 4# weight on.  Max instructional cues but pt able to sit it from supine on mat towards R with mod A, min A to lie down for RLE only  Rest breaks required d/t dypsnea, pt had not had breathing treatment yet and requested nurse call respiratory, HR 65 and O2 97% on RA  Pt only c/o R hip pain with abd/adduction exercise Verbalized 3/3 THP Gait 15 ft close controlled environement Therapy Documentation Precautions:  Precautions Precautions: Posterior Hip Restrictions Weight Bearing Restrictions: Yes RLE Weight Bearing: Partial weight bearing RLE Partial Weight Bearing Percentage or Pounds: 50%  See FIM for current functional status  Therapy/Group: Individual Therapy  Michaelene Song 07/08/2012, 11:43 AM

## 2012-07-09 ENCOUNTER — Inpatient Hospital Stay (HOSPITAL_COMMUNITY): Payer: Medicare Other

## 2012-07-09 ENCOUNTER — Inpatient Hospital Stay (HOSPITAL_COMMUNITY): Payer: Medicare Other | Admitting: Occupational Therapy

## 2012-07-09 LAB — PROTIME-INR: INR: 1.68 — ABNORMAL HIGH (ref 0.00–1.49)

## 2012-07-09 MED ORDER — WARFARIN SODIUM 5 MG PO TABS
5.0000 mg | ORAL_TABLET | Freq: Once | ORAL | Status: AC
  Administered 2012-07-09: 5 mg via ORAL
  Filled 2012-07-09: qty 1

## 2012-07-09 NOTE — Progress Notes (Signed)
Patient ID: Madeline Serrano, female   DOB: 08/05/1930, 78 y.o.   MRN: [id number] Patient ID: Madeline Serrano, female   DOB: [birth year], 78 y.o.   MRN: [id number] Subjective/Complaints: 10/6.  Afebrile.  Asymptomatic without concerns or complaints. Minimal discomfort only; Patient has stable anemia and hyponatremia. INR  has been trending down since Coumadin therapy held. A 12 point review of systems has been performed and if not noted above is otherwise negative.  Chest clear to auscultation; cardiovascular normal heart sounds ; normal rate no murmurs; rhythm is regular and appears to be in a normal sinus rhythm;  abdomen soft flat nontender no organomegaly no distention; extremities no edema  Lab Results  Component Value Date   INR 1.68* 07/09/2012   INR 1.99* 07/08/2012   INR 3.37* 07/07/2012   BP Readings from Last 3 Encounters:  07/09/12 146/73  07/05/12 123/64  07/05/12 123/64   Objective: Vital Signs: Blood pressure 146/73, pulse 66, temperature 97.6 F (36.4 C), temperature source Oral, resp. rate 20, weight 90.5 kg (199 lb 8.3 oz), SpO2 96.00%. No results found.  Basename 07/07/12 0645  WBC 7.7  HGB 9.2*  HCT 27.5*  PLT 297    Basename 07/08/12 0645 07/07/12 0645  NA 127* 127*  K 4.8 4.4  CL 96 96  CO2 22 22  GLUCOSE 108* 116*  BUN 21 19  CREATININE 1.30* 1.20*  CALCIUM 9.4 8.9   CBG (last 3)  No results found for this basename: GLUCAP:3 in the last 72 hours  Wt Readings from Last 3 Encounters:  07/09/12 90.5 kg (199 lb 8.3 oz)  06/29/12 81.647 kg (180 lb)  06/29/12 81.647 kg (180 lb)    Physical Exam:  Constitutional: She is oriented to person, place, and time. She appears well-developed and well-nourished.  HENT:  Head: Normocephalic and atraumatic.  Eyes: Pupils are equal, round, and reactive to light.  Neck: Normal range of motion.  Cardiovascular: Tachycardia present. No murmur, irregular  Pulmonary/Chest: Effort normal and breath sounds normal. No wheezes,  no distress  Abdominal: Normal appearance and bowel sounds are normal. She exhibits no distension.  Musculoskeletal: She exhibits edema (RLE at thigh/shin).  Right 2nd and 3rd atrophied digits. Right leg limited in movement due to pain proximally. LLE intact for motor function. UE's grossly intact except for intrinsic weakness in the right hand Neurological: She is alert and oriented to person, place, and time.  Skin: Skin is warm and dry.  Surgical dressing right hip. Hip with bruising, probably a hematoma associated with wound also   Assessment/Plan: 1. Functional deficits secondary to right femoral neck fx which require 3+ hours per day of interdisciplinary therapy in a comprehensive inpatient rehab setting. Physiatrist is providing close team supervision and 24 hour management of active medical problems listed below. Physiatrist and rehab team continue to assess barriers to discharge/monitor patient progress toward functional and medical goals. FIM: FIM - Bathing Bathing Steps Patient Completed: Chest;Right Arm;Left Arm;Abdomen;Right upper leg;Left upper leg;Front perineal area Bathing: 3: Mod-Patient completes 5-7 69f 10 parts or 50-74%  FIM - Upper Body Dressing/Undressing Upper body dressing/undressing steps patient completed: Thread/unthread right sleeve of pullover shirt/dresss;Thread/unthread left sleeve of pullover shirt/dress;Put head through opening of pull over shirt/dress;Pull shirt over trunk (slept in her bra and declined to remove) Upper body dressing/undressing: 5: Set-up assist to: Obtain clothing/put away FIM - Lower Body Dressing/Undressing Lower body dressing/undressing steps patient completed: Thread/unthread left underwear leg;Thread/unthread left pants leg;Thread/unthread right pants leg;Don/Doff left sock Lower  body dressing/undressing: 2: Max-Patient completed 25-49% of tasks     FIM - Diplomatic Services operational officer Devices: Bedside commode Toilet  Transfers: 4-To toilet/BSC: Min A (steadying Pt. > 75%);3-From toilet/BSC: Mod A (lift or lower assist)  FIM - Banker Devices: Walker;Bed rails;Arm rests Bed/Chair Transfer: 5: Supine > Sit: Supervision (verbal cues/safety issues);4: Chair or W/C > Bed: Min A (steadying Pt. > 75%);5: Sit > Supine: Supervision (verbal cues/safety issues);4: Bed > Chair or W/C: Min A (steadying Pt. > 75%)  FIM - Locomotion: Wheelchair Locomotion: Wheelchair: 5: Travels 150 ft or more: maneuvers on rugs and over door sills with supervision, cueing or coaxing FIM - Locomotion: Ambulation Locomotion: Ambulation Assistive Devices: Designer, industrial/product Ambulation/Gait Assistance: 5: Supervision Locomotion: Ambulation: 1: Travels less than 50 ft with supervision/safety issues  Comprehension Comprehension Mode: Auditory Comprehension: 7-Follows complex conversation/direction: With no assist  Expression Expression Mode: Verbal Expression: 7-Expresses complex ideas: With no assist  Social Interaction Social Interaction: 6-Interacts appropriately with others with medication or extra time (anti-anxiety, antidepressant).  Problem Solving Problem Solving: 7-Solves complex problems: Recognizes & self-corrects  Memory Memory: 7-Complete Independence: No helper  Medical Problem List and Plan:  1. DVT Prophylaxis/Anticoagulation: Pharmaceutical: Coumadin  2. Pain Management: control reasonable.  3. Mood: motivated without signs of distress.  4. Neuropsych: This patient is capable of making decisions on his/her own behalf.  5. Atrial fibrillation with RVR: monitor HR with bid checks. Continue coreg, digoxin, and flecainide.  6. UTI: cipro D#2. cx negative--dc 7. Urinary retention: Discontinue foley and start voiding trial. Get pt to [facility] to void and cath for PVR greater than 350 cc.  8. ABLA: continue iron supplement. Likely hematoma at wound site. Recheck tomorrow  9. Chronic  hyponatremia?: Na now at 127 again..  Follow up again in the am. See below 10. Fluid overload: Improved post diuretics. Weights are trending down, but she still feels "bloated" and tells me her weight is 20lbs higher than what it was at her doctor's office 2 weeks ago. Her clothes are also very tight  -give a dose of lasix today and follow labs, weights closely 11. Wound care: allevyn dressing and pressure relief measures to sacrum  -can use allevyn dressing for hip    LOS (Days) 4 A FACE TO FACE EVALUATION WAS PERFORMED  Rogelia Boga 07/09/2012, 8:18 AM

## 2012-07-09 NOTE — Progress Notes (Signed)
ANTICOAGULATION CONSULT NOTE - F/U Consult  Pharmacy Consult for coumadin Indication: atrial fibrillation  Allergies  Allergen Reactions  . Sulfa Antibiotics Other (See Comments)    Madeline Serrano  . Celebrex (Celecoxib) Swelling  . Clarithromycin Swelling  . Statins Other (See Comments)    Hair fell out  . Vioxx (Rofecoxib) Other (See Comments)    Renal problems  . Penicillins Rash    Patient Measurements: Wt=81.6kg  Vital Signs: Temp: 97.6 F (36.4 C) (10/06 0545) Temp src: Oral (10/06 0545) BP: 146/73 mmHg (10/06 0545) Pulse Rate: 66  (10/06 0545)  Labs:  Basename 07/09/12 0630 07/08/12 0645 07/07/12 0645  HGB -- -- 9.2*  HCT -- -- 27.5*  PLT -- -- 297  APTT -- -- --  LABPROT 19.2* 21.8* 32.2*  INR 1.68* 1.99* 3.37*  HEPARINUNFRC -- -- --  CREATININE -- 1.30* 1.20*  CKTOTAL -- -- --  CKMB -- -- --  TROPONINI -- -- --    The CrCl is unknown because both a height and weight (above a minimum accepted value) are required for this calculation.   Medical History: Past Medical History  Diagnosis Date  . A-fib   . Hypertension   . High cholesterol   . Macular degeneration     Medications:  Prescriptions prior to admission  Medication Sig Dispense Refill  . calcium carbonate (OS-CAL) 600 MG TABS Take 600 mg by mouth 3 (three) times daily with meals.      . carvedilol (COREG) 12.5 MG tablet Take 18.75 mg by mouth 2 (two) times daily with a meal.       . estrogen, conjugated,-medroxyprogesterone (PREMPRO) 0.3-1.5 MG per tablet Take 1 tablet by mouth daily. 5 days a week Does not take on Tuesday and Friday      . fexofenadine (ALLEGRA) 180 MG tablet Take 180 mg by mouth daily as needed. For allergies       . fish oil-omega-3 fatty acids 1000 MG capsule Take 1 g by mouth daily.       . flecainide (TAMBOCOR) 50 MG tablet Take 50 mg by mouth 2 (two) times daily.        . lansoprazole (PREVACID) 15 MG capsule Take 15 mg by mouth every other day.      . Multiple  Vitamins-Minerals (ICAPS) TABS Take 1 tablet by mouth daily.      Marland Kitchen olmesartan (BENICAR) 20 MG tablet Take 20 mg by mouth daily.      Marland Kitchen pyridoxine (B-6) 200 MG tablet Take 200 mg by mouth 2 (two) times daily.       . rosuvastatin (CRESTOR) 10 MG tablet Take 5 mg by mouth every Monday, Wednesday, and Friday.       . tiotropium (SPIRIVA) 18 MCG inhalation capsule Place 18 mcg into inhaler and inhale daily.      Marland Kitchen triamterene-hydrochlorothiazide (MAXZIDE) 75-50 MG per tablet Take 0.5 tablets by mouth daily.      Marland Kitchen warfarin (COUMADIN) 4 MG tablet Take 2-4 mg by mouth daily. Takes 1 tablet on Monday, and Friday Takes 1/2 tablet the rest of the week        Assessment: 78 yo female s/p R hip arthroplasty and also w/ Afib on coumadin PTA now transferred to rehab. Home Coumadin dose is 2mg  STWTS and 4mg  MF. INR was trending up when patient on cipro (d/c'd 10/4). Iron supplement for anemia. Hgb 9.2. INR down to 1.68.  Cards: Afib, HTN. BP 146/73, HR 66 on Coreg, Digoxin, Flecainide.  Patient feeling "bloated." Chronic hyponatremia noted with Na down to 127.  Goal of Therapy:  INR 2-3 Monitor platelets by anticoagulation protocol: Yes   Plan:  - Coumadin 5mg  po x 1 - Continue daily INR - Coumadin discharge education has been completed 9/29.  Tyreik Delahoussaye S. Merilynn Finland, PharmD, [facility] Clinical Staff Pharmacist Pager [phone]  07/09/2012 10:50 AM

## 2012-07-09 NOTE — Progress Notes (Signed)
Physical Therapy Session Note  Patient Details  Name: MELANEE CORDIAL MRN: [id number] Date of Birth: [birth year]  Today's Date: 07/09/2012 Time: 1115-1200 Time Calculation (min): 45 min  Short Term Goals: Week 1:  PT Short Term Goal 1 (Week 1): = long term goals  Skilled Therapeutic Interventions/Progress Updates:  Patient transferred wheelchair to mat with min assist. Patient required assist with right LE for sit to supine. Patient had difficulty getting full hip and knee extension on right initially. Patient performed right active to active-assistive exercise including heel slides, short arc quads, ankle pumps, hip abduction/ adduction, and quad sets for 2 sets of 10 reps each. Patient required max assist with trunk for supine to sit. Patient ambulated with rolling walker on level tile 60 feet PWB on right. Patient propelled wheelchair 150 feet to room. Patient required assist to maneuver within hospital room.  Therapy Documentation Precautions:  Precautions Precautions: Posterior Hip Restrictions Weight Bearing Restrictions: Yes RLE Weight Bearing: Partial weight bearing RLE Partial Weight Bearing Percentage or Pounds: 50% LLE Weight Bearing: Weight bearing as tolerated  Pain: Pain Assessment Pain Assessment: No/denies pain Patient reported some soreness in groin with LE exercises. Did not seem to affect participation. Locomotion : Ambulation Ambulation/Gait Assistance: 4: Min assist   See FIM for current functional status  Therapy/Group: Individual Therapy  Arelia Longest M 07/09/2012, 3:29 PM

## 2012-07-09 NOTE — Progress Notes (Signed)
Occupational Therapy Session Notes  Patient Details  Name: LYNIX BONINE MRN: [id number] Date of Birth: Feb 10, 1930  Today's Date: 07/09/2012 Time: 0800-0900 and 4098-1191 Time Calculation (min): 60 min and 53 min  Short Term Goals: Week 1:  OT Short Term Goal 1 (Week 1): Pt will perform LB bathing with min assist using AE. OT Short Term Goal 2 (Week 1): Pt will perform LB dressing using AE with min assist. OT Short Term Goal 3 (Week 1): Pt will perfrom toilt transfers maintaining PWBing status with supervision. OT Short Term Goal 4 (Week 1): Pt will perform walk-in shower transfer with min assist to shower seat using RW.  Skilled Therapeutic Interventions/Progress Updates:  1)  Self care retraining to include sponge bath (declines shower), dress and groom.  Focus session on activity tolerance, sit><stands, dynamic standing balance, use of AE, and maintaining PWB and THP during all BADL tasks.  Patient needed fewer rest breaks than she did 2 days ago.  Wearing adult brief now secondary to now on fluid pill and had an accident.  Patient with edema and injury on toe of right foot therefore, is not wearing shoes at this time.  2)  Self Care to review/discuss options for shower use at home as well as BSC.  Patient is concerned that she does not have the kind of support that she needs to recover at home yet she refuses to go to a skilled care facility for short period.  He local son works and cares for his family so patient does not want to rely on him too much.  Her son from [place] will come down for ~1week when she discharges.  Discussed options for how she could empty potty chair bucket and options for getting into and out of the shower.  Patient is easily distracted and difficult to stay on one topic.  Suggested that patient have her local son measure and take photos for [place] to make recommendations regarding grab bar placement, method for getting into and out of the shower, and whether the built in shower  seat will be alright to use. Patient still needs to practice options for getting into and out of the shower.  Therapy Documentation Precautions:  Precautions Precautions: Posterior Hip Restrictions Weight Bearing Restrictions: Yes RLE Weight Bearing: Partial weight bearing RLE Partial Weight Bearing Percentage or Pounds: 50% Pain: Denies pain.  See FIM for current functional status  Therapy/Group: Individual Therapy  Latanja Lehenbauer 07/09/2012, 9:19 AM

## 2012-07-09 NOTE — Plan of Care (Signed)
Problem: RH BLADDER ELIMINATION Goal: RH STG MANAGE BLADDER WITH ASSISTANCE STG Manage Bladder With mod independence  Outcome: Progressing Dribbles with coughing and standing, toilet q 4 hours and prn, blue brief in use, staff changing blue brief

## 2012-07-09 NOTE — Progress Notes (Signed)
Wearing diaper to manage stress incontinence, mainly when coughing. Incontinent X 1 this shift. LBM 07/08/12. Right hip dressing C, D, & I, with swelling and bruising. Foam dressing intact to sacrum. Pitting edema to right foot and ankle. Right lateral heel red, encouraged patient to make sure elevated off bed at all times. Transparent dressing in place to right posterior thigh.

## 2012-07-09 NOTE — Progress Notes (Signed)
Physical Therapy Session Note  Patient Details  Name: Madeline Serrano MRN: [id number] Date of Birth: Nov 24, 1929  Today's Date: 07/09/2012 Time: 1430-1500 Time Calculation (min): 30 min  Short Term Goals: Week 1:  PT Short Term Goal 1 (Week 1): = long term goals  Skilled Therapeutic Interventions/Progress Updates:  Patient ambulated with RW 40 feet x 2 with minimal assist on level tile surfaces. Patient required occasional cueing to maintain PWB. Patient occasionally requires slight lifting assistance for sit to stand.   Therapy Documentation Precautions:  Precautions Precautions: Posterior Hip Restrictions Weight Bearing Restrictions: Yes RLE Weight Bearing: Partial weight bearing RLE Partial Weight Bearing Percentage or Pounds: 50% LLE Weight Bearing: Weight bearing as tolerated  Pain: Pain Assessment Pain Assessment: No/denies pain  Locomotion : Ambulation Ambulation/Gait Assistance: 4: Min assist   See FIM for current functional status  Therapy/Group: Individual Therapy  Alma Friendly 07/09/2012, 3:36 PM

## 2012-07-10 ENCOUNTER — Inpatient Hospital Stay (HOSPITAL_COMMUNITY): Payer: Medicare Other | Admitting: Occupational Therapy

## 2012-07-10 ENCOUNTER — Inpatient Hospital Stay (HOSPITAL_COMMUNITY): Payer: Medicare Other | Admitting: Physical Therapy

## 2012-07-10 LAB — DIGOXIN LEVEL: Digoxin Level: 0.7 ng/mL — ABNORMAL LOW (ref 0.8–2.0)

## 2012-07-10 LAB — PROTIME-INR: INR: 1.62 — ABNORMAL HIGH (ref 0.00–1.49)

## 2012-07-10 MED ORDER — WARFARIN SODIUM 5 MG PO TABS
5.0000 mg | ORAL_TABLET | Freq: Once | ORAL | Status: AC
Start: 2012-07-10 — End: 2012-07-10
  Administered 2012-07-10: 5 mg via ORAL
  Filled 2012-07-10: qty 1

## 2012-07-10 MED ORDER — TRIAMTERENE-HCTZ 37.5-25 MG PO TABS
1.0000 | ORAL_TABLET | Freq: Every day | ORAL | Status: DC
  Filled 2012-07-10: qty 1

## 2012-07-10 MED ORDER — TRIAMTERENE-HCTZ 37.5-25 MG PO TABS
1.0000 | ORAL_TABLET | Freq: Two times a day (BID) | ORAL | Status: AC
  Administered 2012-07-10 – 2012-07-11 (×3): 1 via ORAL
  Filled 2012-07-10 (×3): qty 1

## 2012-07-10 NOTE — Progress Notes (Signed)
ANTICOAGULATION CONSULT NOTE - F/U Consult  Pharmacy Consult:  Coumadin Indication: atrial fibrillation  Allergies  Allergen Reactions  . Sulfa Antibiotics Other (See Comments)    [name]  . Celebrex (Celecoxib) Swelling  . Clarithromycin Swelling  . Statins Other (See Comments)    Hair fell out  . Vioxx (Rofecoxib) Other (See Comments)    Renal problems  . Penicillins Rash    Patient Measurements: Weight:  82 kg  Vital Signs: Temp: 97.9 F (36.6 C) (10/07 0500) Temp src: Oral (10/07 0500) BP: 135/73 mmHg (10/07 0500) Pulse Rate: 65  (10/07 0841)  Labs:  Basename 07/10/12 0530 07/09/12 0630 07/08/12 0645  HGB -- -- --  HCT -- -- --  PLT -- -- --  APTT -- -- --  LABPROT 18.7* 19.2* 21.8*  INR 1.62* 1.68* 1.99*  HEPARINUNFRC -- -- --  CREATININE -- -- 1.30*  CKTOTAL -- -- --  CKMB -- -- --  TROPONINI -- -- --    The CrCl is unknown because both a height and weight (above a minimum accepted value) are required for this calculation.   Medical History: Past Medical History  Diagnosis Date  . A-fib   . Hypertension   . High cholesterol   . Macular degeneration        Assessment: 14 YOF s/p right hip arthroplasty and also with atrial fibrillation on Coumadin. Home Coumadin dose is 2mg  STWTS and 4mg  MF.  INR currently subtherapeutic; no bleeding reported.   Goal of Therapy:  INR 2-3 Monitor platelets by anticoagulation protocol: Yes    Plan:  - Repeat Coumadin 5mg  PO today - Continue daily INR    Katrianna Friesenhahn D. Laney Potash, PharmD, BCPS Pager:  [phone] 07/10/2012, 11:21 AM

## 2012-07-10 NOTE — Progress Notes (Addendum)
Physical Therapy Session Note  Patient Details  Name: KARENA KINKER MRN: [id number] Date of Birth: Sep 28, 1930  Today's Date: 07/10/2012 Time: 0730-0830 Time Calculation (min): 60 min  Short Term Goals: Week 1:  PT Short Term Goal 1 (Week 1): = long term goals  Skilled Therapeutic Interventions/Progress Updates:    Bed mobility x 2 with hospital bed flat no bed rails, min assist nearly supervision with pillow between legs. Gait x 50', 60' with RW, min-guard assist cues for posture and to decrease Rt. Foot abduction/knee adduction (poor mechanics at hip). Bed mobility on actual bed x 2 reps utilizing leg lifter, supervision and  max progressing to mod verbal cues for sequencing and precautions. Short distance ambulation in home environment over carpet with RW, min-guard assist, more taxing for pt. Room and bathroom negotiation with min assist cues for negotiating tight spaces. Min assist to stand from toilet.   *Discussed use of wheelchair at home, pt adamantly against and feels it will not be beneficial in the home.   Second Session Skilled Therapeutic Interventions/Progress Updates:  Time:  1133-1202 Time Calculation (min): 29 min Session focused on stair training. Pt required multiple demonstration by PT as she was anxious and distracted. Pt performed 2 reps of full simulation of home environment step. Pt requires mod verbal cues for task, min assist for loss of balance particularly with backing up to step.    Therapy Documentation Precautions:  Precautions Precautions: Posterior Hip Restrictions Weight Bearing Restrictions: Yes RLE Weight Bearing: Partial weight bearing RLE Partial Weight Bearing Percentage or Pounds: 50% LLE Weight Bearing: Weight bearing as tolerated  Pain: Pain Assessment Pain Assessment: No/denies pain both sessions  See FIM for current functional status  Therapy/Group: Individual Therapy both sessions  [name] 07/10/2012, 12:10 PM

## 2012-07-10 NOTE — Progress Notes (Signed)
Subjective/Complaints:  Still concerned about her swelling. No other issues this weekend  A 12 point review of systems has been performed and if not noted above is otherwise negative.   Objective: Vital Signs: Blood pressure 135/73, pulse 64, temperature 97.9 F (36.6 C), temperature source Oral, resp. rate 17, weight 92.8 kg (204 lb 9.4 oz), SpO2 95.00%. No results found. No results found for this basename: WBC:2,HGB:2,HCT:2,PLT:2 in the last 72 hours  Basename 07/08/12 0645  NA 127*  K 4.8  CL 96  CO2 22  GLUCOSE 108*  BUN 21  CREATININE 1.30*  CALCIUM 9.4   CBG (last 3)  No results found for this basename: GLUCAP:3 in the last 72 hours  Wt Readings from Last 3 Encounters:  07/10/12 92.8 kg (204 lb 9.4 oz)  06/29/12 81.647 kg (180 lb)  06/29/12 81.647 kg (180 lb)    Physical Exam:  Constitutional: She is oriented to person, place, and time. She appears well-developed and well-nourished.  HENT:  Head: Normocephalic and atraumatic.  Eyes: Pupils are equal, round, and reactive to light.  Neck: Normal range of motion.  Cardiovascular: Tachycardia present. No murmur, irregular  Pulmonary/Chest: Effort normal and breath sounds normal. No wheezes, no distress  Abdominal: Normal appearance and bowel sounds are normal. She exhibits no distension.  Musculoskeletal: She exhibits edema (RLE at thigh/shin).  Right 2nd and 3rd atrophied digits. Right leg limited in movement due to pain proximally. LLE intact for motor function. UE's grossly intact except for intrinsic weakness in the right hand Neurological: She is alert and oriented to person, place, and time.  Skin: Skin is warm and dry.  Surgical dressing right hip. Hip with bruising, probably a hematoma associated with wound also   Assessment/Plan: 1. Functional deficits secondary to right femoral neck fx which require 3+ hours per day of interdisciplinary therapy in a comprehensive inpatient rehab setting. Physiatrist is  providing close team supervision and 24 hour management of active medical problems listed below. Physiatrist and rehab team continue to assess barriers to discharge/monitor patient progress toward functional and medical goals. FIM: FIM - Bathing Bathing Steps Patient Completed: Chest;Right Arm;Left Arm;Abdomen;Right upper leg;Left upper leg;Front perineal area Bathing: 3: Mod-Patient completes 5-7 89f 10 parts or 50-74%  FIM - Upper Body Dressing/Undressing Upper body dressing/undressing steps patient completed: Thread/unthread right sleeve of pullover shirt/dresss;Thread/unthread left sleeve of pullover shirt/dress;Put head through opening of pull over shirt/dress;Pull shirt over trunk (slept in her bra and declined to remove) Upper body dressing/undressing: 5: Set-up assist to: Obtain clothing/put away FIM - Lower Body Dressing/Undressing Lower body dressing/undressing steps patient completed: Thread/unthread left pants leg;Thread/unthread right pants leg;Don/Doff left sock;Pull pants up/down;Don/Doff right sock (wearing a brief today) Lower body dressing/undressing: 3: Mod-Patient completed 50-74% of tasks  FIM - Toileting Toileting steps completed by patient: Adjust clothing prior to toileting;Performs perineal hygiene Toileting Assistive Devices: Grab bar or rail for support Toileting: 3: Mod-Patient completed 2 of 3 steps  FIM - Diplomatic Services operational officer Devices: Psychiatrist Transfers: 4-To toilet/BSC: Min A (steadying Pt. > 75%)  FIM - Banker Devices: Walker;Arm rests;Bed rails Bed/Chair Transfer: 4: Bed > Chair or W/C: Min A (steadying Pt. > 75%)  FIM - Locomotion: Wheelchair Locomotion: Wheelchair: 4: Travels 150 ft or more: maneuvers on rugs and over door sillls with minimal assistance (Pt.>75%) FIM - Locomotion: Ambulation Locomotion: Ambulation Assistive Devices: Designer, industrial/product Ambulation/Gait  Assistance: 4: Min assist Locomotion: Ambulation: 2: Travels 50 - 149  ft with minimal assistance (Pt.>75%)  Comprehension Comprehension Mode: Auditory Comprehension: 7-Follows complex conversation/direction: With no assist  Expression Expression Mode: Verbal Expression: 7-Expresses complex ideas: With no assist  Social Interaction Social Interaction: 6-Interacts appropriately with others with medication or extra time (anti-anxiety, antidepressant).  Problem Solving Problem Solving: 7-Solves complex problems: Recognizes & self-corrects  Memory Memory: 7-Complete Independence: No helper  Medical Problem List and Plan:  1. DVT Prophylaxis/Anticoagulation: Pharmaceutical: Coumadin  2. Pain Management: control reasonable.  3. Mood: motivated without signs of distress.  4. Neuropsych: This patient is capable of making decisions on his/her own behalf.  5. Atrial fibrillation with RVR: monitor HR with bid checks. Continue coreg, digoxin, and flecainide.  6. UTI: ucx negative 7. Urinary retention: Discontinue foley and start voiding trial. Get pt to [facility] to void and cath for PVR greater than 350 cc.  8. ABLA: continue iron supplement. Stable 9. Chronic hyponatremia?: Na ranging from 127 to 129. Recheck again this week. 10. Fluid overload: Improved post diuretics.weight actually stable to slightly decreased since admit to rehab  -will resume maxzide paying close attention to renal function and sodium  -trying to avoid excessive diuretic use due to hyponatremia 11. Wound care: allevyn dressing and pressure relief measures to sacrum  -can use allevyn dressing for hip    LOS (Days) 5 A FACE TO FACE EVALUATION WAS PERFORMED  Roshun Klingensmith T 07/10/2012, 8:17 AM

## 2012-07-10 NOTE — Progress Notes (Signed)
Occupational Therapy Session Note  Patient Details  Name: Madeline Serrano MRN: [id number] Date of Birth: 02-04-1930  Today's Date: 07/10/2012 Time: 1330-1400 Time Calculation (min): 30 min  Short Term Goals: Week 1:  OT Short Term Goal 1 (Week 1): Pt will perform LB bathing with min assist using AE. OT Short Term Goal 2 (Week 1): Pt will perform LB dressing using AE with min assist. OT Short Term Goal 3 (Week 1): Pt will perfrom toilt transfers maintaining PWBing status with supervision. OT Short Term Goal 4 (Week 1): Pt will perform walk-in shower transfer with min assist to shower seat using RW.  Skilled Therapeutic Interventions/Progress Updates:  Therapeutic activity to include attempt to perform bathroom transfers (shower and toilet).  Patient ambulated to bathroom and before could complete any bathroom transfers, patient reported that her left knee was going to give out and she needed to sit right away.  After rest in sitting, patient attempted again however she was unable to stand and was placed in w/c and requested to get into bed.  Patient performed w/c>bed transfer with mod assist.  Patient continues to decline temporary use of w/c at home.  Therapy Documentation Precautions:  Precautions Precautions: Posterior Hip Restrictions Weight Bearing Restrictions: Yes RLE Weight Bearing: Partial weight bearing RLE Partial Weight Bearing Percentage or Pounds: 50% LLE Weight Bearing: Weight bearing as tolerated Pain: Pain Assessment Pain Assessment: No/denies pain  See FIM for current functional status  Therapy/Group: Individual Therapy  Kaiyana Bedore 07/10/2012, 3:46 PM

## 2012-07-10 NOTE — Progress Notes (Signed)
Physical Therapy Session Note  Patient Details  Name: Madeline Serrano MRN: [id number] Date of Birth: July 27, 1930  Today's Date: 07/10/2012 Time: 1191-4782 Time Calculation (min): 28 min  Short Term Goals: Week 1:  PT Short Term Goal 1 (Week 1): = long term goals  Skilled Therapeutic Interventions/Progress Updates:    Pt reports during OT she had Lt. Knee pain and buckling, reports when at home she uses ACE wrap if this pain occurs. Wrapped pt's knee with ACE wrap until brace comes (requested by OT). Gait x 40', 40' with RW close supervision. Sit > supine in hospital bed, supervision will need further practice with regular bed as pt has more difficulty with Rt. LE on higher bed. Discussed D/C, may need supervision at night by son. Pt does not feel ready to D/C this week, would like more time to gain strength, balance, and decrease risk for falls at home. Short term goals added.   Therapy Documentation Precautions:  Precautions Precautions: Posterior Hip Restrictions Weight Bearing Restrictions: Yes RLE Weight Bearing: Partial weight bearing RLE Partial Weight Bearing Percentage or Pounds: 50% LLE Weight Bearing: Weight bearing as tolerated Pain: Pain Assessment Pain Assessment: 0-10 Pain Score:   4 Pain Type: Chronic pain Pain Location: Knee Pain Orientation: Left Pain Descriptors: Aching Pain Onset: With Activity Pain Intervention(s): Repositioned;Other (Comment) (premedicated, applied ACE wrap) Locomotion : Ambulation Ambulation/Gait Assistance: 4: Min guard   See FIM for current functional status  Therapy/Group: Individual Therapy  [name] 07/10/2012, 5:41 PM

## 2012-07-10 NOTE — Progress Notes (Signed)
Occupational Therapy Session Note  Patient Details  Name: Madeline Serrano MRN: [id number] Date of Birth: 23-Apr-1930  Today's Date: 07/10/2012 Time: 3664-4034 Time Calculation (min): 46 min  Short Term Goals: Week 1:  OT Short Term Goal 1 (Week 1): Pt will perform LB bathing with min assist using AE. OT Short Term Goal 2 (Week 1): Pt will perform LB dressing using AE with min assist. OT Short Term Goal 3 (Week 1): Pt will perfrom toilt transfers maintaining PWBing status with supervision. OT Short Term Goal 4 (Week 1): Pt will perform walk-in shower transfer with min assist to shower seat using RW.  Skilled Therapeutic Interventions/Progress Updates:    Bathing and dressing session sit to stand at the sink.  Pt performs bathing with overall min assist.  Needs assistance with washing lower legs and feet secondary to not having long handle sponge.  Able to use the reacher and a towel to dry off her lower legs however.  Pt utilized reacher for donning pants and the sock aide for donning her socks.  Still only needs min assist for sit to stand during selfcare tasks.  Pt still voicing concern over the increased fluid retention in her body.  Therapy Documentation Precautions:  Precautions Precautions: Posterior Hip Restrictions Weight Bearing Restrictions: Yes RLE Weight Bearing: Partial weight bearing RLE Partial Weight Bearing Percentage or Pounds: 50% LLE Weight Bearing: Weight bearing as tolerated  Vital Signs: Therapy Vitals Pulse Rate: 65  Oxygen Therapy SpO2: 96 % O2 Device: None (Room air) Pain: Pain Assessment Pain Assessment: No/denies pain ADL: See FIM for current functional status  Therapy/Group: Individual Therapy  Marvetta Vohs OTR/L 07/10/2012, 11:48 AM

## 2012-07-11 ENCOUNTER — Inpatient Hospital Stay (HOSPITAL_COMMUNITY): Payer: Medicare Other | Admitting: Physical Therapy

## 2012-07-11 ENCOUNTER — Inpatient Hospital Stay (HOSPITAL_COMMUNITY): Payer: Medicare Other | Admitting: Occupational Therapy

## 2012-07-11 DIAGNOSIS — Z5189 Encounter for other specified aftercare: Secondary | ICD-10-CM

## 2012-07-11 DIAGNOSIS — W19XXXA Unspecified fall, initial encounter: Secondary | ICD-10-CM

## 2012-07-11 DIAGNOSIS — I509 Heart failure, unspecified: Secondary | ICD-10-CM

## 2012-07-11 DIAGNOSIS — I4891 Unspecified atrial fibrillation: Secondary | ICD-10-CM

## 2012-07-11 DIAGNOSIS — D62 Acute posthemorrhagic anemia: Secondary | ICD-10-CM

## 2012-07-11 DIAGNOSIS — S72009A Fracture of unspecified part of neck of unspecified femur, initial encounter for closed fracture: Secondary | ICD-10-CM

## 2012-07-11 LAB — BASIC METABOLIC PANEL
BUN: 15 mg/dL (ref 6–23)
Creatinine, Ser: 1.2 mg/dL — ABNORMAL HIGH (ref 0.50–1.10)
GFR calc Af Amer: 48 mL/min — ABNORMAL LOW (ref >90)
GFR calc non Af Amer: 42 mL/min — ABNORMAL LOW (ref >90)

## 2012-07-11 LAB — PROTIME-INR
INR: 2.13 — ABNORMAL HIGH (ref 0.00–1.49)
Prothrombin Time: 22.9 seconds — ABNORMAL HIGH (ref 11.6–15.2)

## 2012-07-11 MED ORDER — WARFARIN SODIUM 2 MG PO TABS
2.0000 mg | ORAL_TABLET | Freq: Once | ORAL | Status: AC
  Administered 2012-07-11: 2 mg via ORAL
  Filled 2012-07-11: qty 1

## 2012-07-11 MED ORDER — TRIAMTERENE-HCTZ 75-50 MG PO TABS
1.0000 | ORAL_TABLET | Freq: Every day | ORAL | Status: DC
  Administered 2012-07-12 – 2012-07-16 (×5): 1 via ORAL
  Filled 2012-07-11 (×7): qty 1

## 2012-07-11 NOTE — Progress Notes (Signed)
Physical Therapy Session Note  Patient Details  Name: Madeline Serrano MRN: [id number] Date of Birth: 09-25-1930  Today's Date: 07/11/2012 Time: 1191-4782 Time Calculation (min): 29 min  Short Term Goals: Week 1:  PT Short Term Goal 1 (Week 1): Pt will perform sit <> stands with supervision consistently  PT Short Term Goal 2 (Week 1): Pt will ambulate 78' with RW with supervision PT Short Term Goal 3 (Week 1): Pt will perform bed <> chair transfer with supervision.   Skilled Therapeutic Interventions/Progress Updates:    Session focused on home environment mobility. Ambulation over carpeted surface forwards, side stepping, and backwards walking x 55' with RW, supervision . Bed mobility performed on actual bed x 2 reps mod progressing to min verbal cues for posterior hip precautions however pt able to perform without physical assist. Gait x 45' with RW, supervision. Pt limited by fatigue and Lt. Knee pain/fatigue which causes pt to feel it is going to buckle.   Therapy Documentation Precautions:  Precautions Precautions: Fall;Posterior Hip Restrictions Weight Bearing Restrictions: Yes RLE Weight Bearing: Partial weight bearing RLE Partial Weight Bearing Percentage or Pounds: 50% LLE Weight Bearing: Weight bearing as tolerated Pain: Pain Assessment Pain Assessment: 0-10 Pain Score:   3 Pain Type: Chronic pain Pain Location: Knee Pain Orientation: Left Pain Descriptors: Aching Pain Onset: On-going Pain Intervention(s): Repositioned;Other (Comment) (declined pain medication)  See FIM for current functional status  Therapy/Group: Individual Therapy  Sherrine Maples Cheek 07/11/2012, 6:00 PM

## 2012-07-11 NOTE — Progress Notes (Signed)
ANTICOAGULATION CONSULT NOTE - F/U Consult  Pharmacy Consult:  Coumadin Indication: atrial fibrillation  Allergies  Allergen Reactions  . Sulfa Antibiotics Other (See Comments)    [name]  . Celebrex (Celecoxib) Swelling  . Clarithromycin Swelling  . Statins Other (See Comments)    Hair fell out  . Vioxx (Rofecoxib) Other (See Comments)    Renal problems  . Penicillins Rash    Patient Measurements: Weight:  82 kg  Vital Signs: Temp: 98.2 F (36.8 C) (10/08 0500) Temp src: Oral (10/08 0500) BP: 167/60 mmHg (10/08 0500) Pulse Rate: 67  (10/08 0500)  Labs:  Basename 07/11/12 0547 07/10/12 0530 07/09/12 0630  HGB -- -- --  HCT -- -- --  PLT -- -- --  APTT -- -- --  LABPROT 22.9* 18.7* 19.2*  INR 2.13* 1.62* 1.68*  HEPARINUNFRC -- -- --  CREATININE 1.20* -- --  CKTOTAL -- -- --  CKMB -- -- --  TROPONINI -- -- --    The CrCl is unknown because both a height and weight (above a minimum accepted value) are required for this calculation.   Medical History: Past Medical History  Diagnosis Date  . A-fib   . Hypertension   . High cholesterol   . Macular degeneration        Assessment: 55 YOF s/p right hip arthroplasty and also with atrial fibrillation on Coumadin. Home Coumadin dose is 2mg  STWTS and 4mg  MF.  INR increased to therapeutic level today; no bleeding reported.  Based on previous trend, will decrease Coumadin dose today.   Goal of Therapy:  INR 2-3 Monitor platelets by anticoagulation protocol: Yes    Plan:  - Coumadin 2mg  PO today - Continue daily INR    Sriram Febles D. Laney Potash, PharmD, BCPS Pager:  [phone] 07/11/2012, 11:03 AM

## 2012-07-11 NOTE — Progress Notes (Signed)
Occupational Therapy Session Note  Patient Details  Name: BERTINE SCHLOTTMAN MRN: [id number] Date of Birth: [birth year]  Today's Date: 07/11/2012 Time: 0900-1000 Time Calculation (min): 60 min  Short Term Goals: Week 1:  OT Short Term Goal 1 (Week 1): Pt will perform LB bathing with min assist using AE. OT Short Term Goal 2 (Week 1): Pt will perform LB dressing using AE with min assist. OT Short Term Goal 3 (Week 1): Pt will perfrom toilt transfers maintaining PWBing status with supervision. OT Short Term Goal 4 (Week 1): Pt will perform walk-in shower transfer with min assist to shower seat using RW.  Skilled Therapeutic Interventions/Progress Updates:  Self care retraining to include toilet transfers, toileting, groom at sink, edema control, pressure relief, and continue to review BADL within the context of patient's home environment.  Patient ambulated to & from bathroom and to & from sink to wash hands with steady assist. Patient with minimal LOB 3-4 times when she moves too quickly, takes hands off walker for unilateral or bilateral tasks, or moves walker too far in front while ambulating.  Patient required rest breaks yet left knee did not "give out" during above tasks the AM.  After discussion regarding level of assist patient may need upon discharge, she has agreed to consider SNF for short period to transition home.  SW informed.  Therapy Documentation Precautions:  Precautions Precautions: Posterior Hip Restrictions Weight Bearing Restrictions: Yes RLE Weight Bearing: Partial weight bearing RLE Partial Weight Bearing Percentage or Pounds: 50% LLE Weight Bearing: Weight bearing as tolerated  Pain: 2/10, right hip/groin, declines medication  See FIM for current functional status  Therapy/Group: Individual Therapy  Liadan Guizar 07/11/2012, 10:22 AM

## 2012-07-11 NOTE — Progress Notes (Signed)
Physical Therapy Session Note  Patient Details  Name: Madeline Serrano MRN: [id number] Date of Birth: Sep 09, 1930  Today's Date: 07/11/2012 Time: 1001-1059 Time Calculation (min): 58 min  Short Term Goals: Week 1:  PT Short Term Goal 1 (Week 1): Pt will perform sit <> stands with supervision consistently  PT Short Term Goal 2 (Week 1): Pt will ambulate 71' with RW with supervision PT Short Term Goal 3 (Week 1): Pt will perform bed <> chair transfer with supervision.   Skilled Therapeutic Interventions/Progress Updates:    Sit <> stands 5 in a row, pt wanting to see if she would need to have BM prior to leaving room. Bathroom mobility and balance in bathroom with donning of brief, cleaning self. Ambulation x 50', 40' with RW and supervision cues for weight bearing precautions, breathing, and Rt lower extremity placement. Car transfer performed after PT demonstration with min assist, max verbal cues for maintaining precautions and for sequencing. Difficult for pt.   Discussed wound on bottom, pressure relief in wheelchair. Attempted different cushion for improved pressure relief however pt reports current pressure relief cushion more comfortable.   Therapy Documentation Precautions:  Precautions Precautions: Fall;Posterior Hip Restrictions Weight Bearing Restrictions: Yes RLE Weight Bearing: Partial weight bearing RLE Partial Weight Bearing Percentage or Pounds: 50% LLE Weight Bearing: Weight bearing as tolerated Pain: Pain Assessment Pain Assessment: 0-10 Pain Score: 5 Pain Type: Chronic pain Pain Location: Knee Pain Orientation: Left Pain Descriptors: Aching Pain Onset: With Activity Pain Intervention(s): RN made aware, requesting medication Locomotion : Ambulation Ambulation/Gait Assistance: 4: Min guard   See FIM for current functional status  Therapy/Group: Individual Therapy  [name] 07/11/2012, 12:19 PM

## 2012-07-11 NOTE — Plan of Care (Signed)
Problem: RH BLADDER ELIMINATION Goal: RH STG MANAGE BLADDER WITH ASSISTANCE STG Manage Bladder With mod independence  Outcome: Not Progressing Pt incontinent at times. Pt has urgancy

## 2012-07-11 NOTE — Patient Care Conference (Signed)
Inpatient RehabilitationTeam Conference Note Date: 07/11/2012   Time: 2:25 PM    Patient Name: Madeline Serrano      Medical Record Number: [id number]  Date of Birth: 07/23/1930 Sex: Female         Room/Bed: 4142/4142-01 Payor Info: Payor: MEDICARE  Plan: MEDICARE PART A AND B  Product Type: *No Product type*     Admitting Diagnosis: RT HIP FX  Admit Date/Time:  07/05/2012  3:59 PM Admission Comments: No comment available   Primary Diagnosis:  Hip fracture, right Principal Problem: Hip fracture, right  Patient Active Problem List   Diagnosis Date Noted  . COPD (chronic obstructive pulmonary disease) 07/05/2012  . Acute blood loss anemia 07/03/2012  . Urinary retention 07/03/2012  . Hip fracture, right 06/29/2012  . Hyponatremia 06/29/2012  . Coagulopathy 06/29/2012  . Atrial fibrillation/flutter with Rapid Ventricular Rate 06/29/2012  . Osteoporosis 06/29/2012  . Hypertension 06/29/2012    Expected Discharge Date: Expected Discharge Date:  (plan SNF)  Team Members Present: Physician: Dr. Faith Rogue Social Worker Present: Amada Jupiter, LCSW Nurse Present: Other (comment) Kennon Portela, RN) PT Present: Other (comment) Sherrine Maples, PT) OT Present: Mackie Pai, OT Other (Discipline and Name): Tora Duck, PPS Coordinator     Current Status/Progress Goal Weekly Team Focus  Medical   right hip fx, pain issues, edema, anemia  diet supplement. diuresis to reduce fluid weight  reassurance, balancing medical issues with fxnl movements   Bowel/Bladder   continent of bowel, on colace BID, continent/incontinent of bladder, toilet q 4 hours and prn, depends in use, dribbles with cough  continent of bowel and bladder with toileting  up to BSC/BR , no bedpans   Swallow/Nutrition/ Hydration   Heart Healthy Diet         ADL's   currently min assist level for both bathing and dressing as well as toileting.  Goals are supervision level overall  selfcare retraining, increased  indepedence with AE and DME use with all functional tasks.  Being able to follow THR precautions.   Mobility   Min-guard assist  Supervision/modified independent  Improve funcitonal mobility, strength, endurance, maximize endurance.   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      Team Discussion:  Making slow gains, but anticipate will need to maximize  time with therapies via SNF transition.    Revisions to Treatment Plan:  Change in d/c plan to SNF   Continued Need for Acute Rehabilitation Level of Care: The patient requires daily medical management by a physician with specialized training in physical medicine and rehabilitation for the following conditions: Daily direction of a multidisciplinary physical rehabilitation program to ensure safe treatment while eliciting the highest outcome that is of practical value to the  patient.: Yes Daily medical management of patient stability for increased activity during participation in an intensive rehabilitation regime.: Yes Daily analysis of laboratory values and/or radiology reports with any subsequent need for medication adjustment of medical intervention for : Post surgical problems;Neurological problems;Other  Madeline Serrano 07/11/2012, 3:56 PM

## 2012-07-11 NOTE — Progress Notes (Signed)
Subjective/Complaints:  Happy that she's "peeing" more.  No other issues this weekend  A 12 point review of systems has been performed and if not noted above is otherwise negative.   Objective: Vital Signs: Blood pressure 167/60, pulse 67, temperature 98.2 F (36.8 C), temperature source Oral, resp. rate 17, weight 90.7 kg (199 lb 15.3 oz), SpO2 98.00%. No results found. No results found for this basename: WBC:2,HGB:2,HCT:2,PLT:2 in the last 72 hours  Basename 07/11/12 0547  NA 131*  K 4.2  CL 97  CO2 25  GLUCOSE 105*  BUN 15  CREATININE 1.20*  CALCIUM 9.6   CBG (last 3)  No results found for this basename: GLUCAP:3 in the last 72 hours  Wt Readings from Last 3 Encounters:  07/11/12 90.7 kg (199 lb 15.3 oz)  06/29/12 81.647 kg (180 lb)  06/29/12 81.647 kg (180 lb)    Physical Exam:  Constitutional: She is oriented to person, place, and time. She appears well-developed and well-nourished.  HENT:  Head: Normocephalic and atraumatic.  Eyes: Pupils are equal, round, and reactive to light.  Neck: Normal range of motion.  Cardiovascular: Tachycardia present. No murmur, irregular  Pulmonary/Chest: Effort normal and breath sounds normal. No wheezes, no distress  Abdominal: Normal appearance and bowel sounds are normal. She exhibits no distension.  Musculoskeletal: She exhibits edema (RLE at thigh/shin).  Right 2nd and 3rd atrophied digits. Right leg limited in movement due to pain proximally. LLE intact for motor function. UE's grossly intact except for intrinsic weakness in the right hand Neurological: She is alert and oriented to person, place, and time.  Skin: Skin is warm and dry.  Surgical dressing right hip. Hip with bruising, probably a hematoma associated with wound also   Assessment/Plan: 1. Functional deficits secondary to right femoral neck fx which require 3+ hours per day of interdisciplinary therapy in a comprehensive inpatient rehab setting. Physiatrist is  providing close team supervision and 24 hour management of active medical problems listed below. Physiatrist and rehab team continue to assess barriers to discharge/monitor patient progress toward functional and medical goals. FIM: FIM - Bathing Bathing Steps Patient Completed: Chest;Right Arm;Left Arm;Abdomen;Right upper leg;Left upper leg Bathing: 4: Min-Patient completes 8-9 40f 10 parts or 75+ percent (pt did not attempt peri area or buttocks)  FIM - Upper Body Dressing/Undressing Upper body dressing/undressing steps patient completed: Thread/unthread right bra strap;Thread/unthread left bra strap;Thread/unthread right sleeve of pullover shirt/dresss;Put head through opening of pull over shirt/dress;Pull shirt over trunk;Thread/unthread left sleeve of pullover shirt/dress Upper body dressing/undressing: 4: Min-Patient completed 75 plus % of tasks FIM - Lower Body Dressing/Undressing Lower body dressing/undressing steps patient completed: Thread/unthread right pants leg;Thread/unthread left pants leg;Pull pants up/down;Don/Doff right sock;Don/Doff left sock Lower body dressing/undressing: 4: Min-Patient completed 75 plus % of tasks  FIM - Toileting Toileting steps completed by patient: Adjust clothing prior to toileting;Performs perineal hygiene Toileting Assistive Devices: Grab bar or rail for support Toileting: 3: Mod-Patient completed 2 of 3 steps  FIM - Diplomatic Services operational officer Devices: Art gallery manager Transfers: 4-To toilet/BSC: Min A (steadying Pt. > 75%);4-From toilet/BSC: Min A (steadying Pt. > 75%)  FIM - Bed/Chair Transfer Bed/Chair Transfer Assistive Devices: Therapist, occupational: 5: Sit > Supine: Supervision (verbal cues/safety issues);4: Bed > Chair or W/C: Min A (steadying Pt. > 75%);4: Chair or W/C > Bed: Min A (steadying Pt. > 75%)  FIM - Locomotion: Wheelchair Locomotion: Wheelchair: 0: Activity did not occur FIM - Locomotion:  Ambulation Locomotion: Ambulation Assistive Devices:  Walker - Rolling Ambulation/Gait Assistance: 4: Min guard Locomotion: Ambulation: 1: Travels less than 50 ft with minimal assistance (Pt.>75%)  Comprehension Comprehension Mode: Auditory Comprehension: 7-Follows complex conversation/direction: With no assist  Expression Expression Mode: Verbal Expression: 7-Expresses complex ideas: With no assist  Social Interaction Social Interaction: 6-Interacts appropriately with others with medication or extra time (anti-anxiety, antidepressant).  Problem Solving Problem Solving: 7-Solves complex problems: Recognizes & self-corrects  Memory Memory: 7-Complete Independence: No helper  Medical Problem List and Plan:  1. DVT Prophylaxis/Anticoagulation: Pharmaceutical: Coumadin  2. Pain Management: control reasonable.  3. Mood: motivated without signs of distress.  4. Neuropsych: This patient is capable of making decisions on his/her own behalf.  5. Atrial fibrillation with RVR: monitor HR with bid checks. Continue coreg, digoxin, and flecainide.  6. UTI: ucx negative 7. Urinary retention: Discontinue foley and start voiding trial. Get pt to [facility] to void and cath for PVR greater than 350 cc.  8. ABLA: continue iron supplement. Stable 9. Chronic hyponatremia?: Na ranging from 127 to 129. Recheck 131 today AFTER diuretic. 10. Fluid overload: Improved post diuretics.weight actually stable to slightly decreased since admit to rehab  -continue maxzide at full dose  -check labs serially 11. Wound care: allevyn dressing and pressure relief measures to sacrum  -can use allevyn dressing for hip    LOS (Days) 6 A FACE TO FACE EVALUATION WAS PERFORMED  Omunique Pederson T 07/11/2012, 8:44 AM

## 2012-07-11 NOTE — Progress Notes (Signed)
Occupational Therapy Session Note  Patient Details  Name: Madeline Serrano MRN: [id number] Date of Birth: November 18, 1929  Today's Date: 07/11/2012 Time: 4098-1191 Time Calculation (min): 46 min  Short Term Goals: Week 1:  OT Short Term Goal 1 (Week 1): Pt will perform LB bathing with min assist using AE. OT Short Term Goal 2 (Week 1): Pt will perform LB dressing using AE with min assist. OT Short Term Goal 3 (Week 1): Pt will perfrom toilt transfers maintaining PWBing status with supervision. OT Short Term Goal 4 (Week 1): Pt will perform walk-in shower transfer with min assist to shower seat using RW.  Skilled Therapeutic Interventions/Progress Updates:    Bathing and dressing sit to stand at the sink.  Had pt ambulate with her RW to gather her towel and washcloth for the session.  Performed sponge bath at the sink.  Pt needing min instructional cueing for correct hand placement on the wheelchair arms for sit to stand.  She tends to try and pull up on the walker and the sink at times.  Utilizes reacher and sock aide secondary to her posterior hip precautions with min instructional cueing as well.  Min assist needed for sit to stand.  Therapy Documentation Precautions:  Precautions Precautions: Fall;Posterior Hip Restrictions Weight Bearing Restrictions: Yes RLE Weight Bearing: Partial weight bearing RLE Partial Weight Bearing Percentage or Pounds: 50% LLE Weight Bearing: Weight bearing as tolerated  Vital Signs: Oxygen Therapy SpO2: 98 % O2 Device: None (Room air) Pain: Pain Assessment Pain Assessment: 0-10 Pain Score:   5 Pain Type: Chronic pain Pain Location: Knee Pain Orientation: Left Pain Descriptors: Aching Pain Intervention(s): Medication (See eMAR) ADL: See FIM for current functional status  Therapy/Group: Individual Therapy  Nuh Lipton OTR/L .07/11/2012, 11:45 AM

## 2012-07-12 ENCOUNTER — Inpatient Hospital Stay (HOSPITAL_COMMUNITY): Payer: Medicare Other

## 2012-07-12 ENCOUNTER — Inpatient Hospital Stay (HOSPITAL_COMMUNITY): Payer: Medicare Other | Admitting: Physical Therapy

## 2012-07-12 ENCOUNTER — Inpatient Hospital Stay (HOSPITAL_COMMUNITY): Payer: Medicare Other | Admitting: Occupational Therapy

## 2012-07-12 DIAGNOSIS — I509 Heart failure, unspecified: Secondary | ICD-10-CM

## 2012-07-12 DIAGNOSIS — W19XXXA Unspecified fall, initial encounter: Secondary | ICD-10-CM

## 2012-07-12 DIAGNOSIS — S72009A Fracture of unspecified part of neck of unspecified femur, initial encounter for closed fracture: Secondary | ICD-10-CM

## 2012-07-12 DIAGNOSIS — I4891 Unspecified atrial fibrillation: Secondary | ICD-10-CM

## 2012-07-12 DIAGNOSIS — E871 Hypo-osmolality and hyponatremia: Secondary | ICD-10-CM

## 2012-07-12 DIAGNOSIS — Z5189 Encounter for other specified aftercare: Secondary | ICD-10-CM

## 2012-07-12 DIAGNOSIS — D62 Acute posthemorrhagic anemia: Secondary | ICD-10-CM

## 2012-07-12 LAB — PROTIME-INR: Prothrombin Time: 25.8 seconds — ABNORMAL HIGH (ref 11.6–15.2)

## 2012-07-12 MED ORDER — WARFARIN SODIUM 2 MG PO TABS
2.0000 mg | ORAL_TABLET | Freq: Once | ORAL | Status: AC
  Administered 2012-07-12: 2 mg via ORAL
  Filled 2012-07-12: qty 1

## 2012-07-12 NOTE — Progress Notes (Signed)
Subjective/Complaints:  Swelling better, although it does seem to accumulate later in the day  A 12 point review of systems has been performed and if not noted above is otherwise negative.   Objective: Vital Signs: Blood pressure 158/53, pulse 62, temperature 98.4 F (36.9 C), temperature source Oral, resp. rate 19, weight 90.6 kg (199 lb 11.8 oz), SpO2 96.00%. No results found. No results found for this basename: WBC:2,HGB:2,HCT:2,PLT:2 in the last 72 hours  Basename 07/11/12 0547  NA 131*  K 4.2  CL 97  CO2 25  GLUCOSE 105*  BUN 15  CREATININE 1.20*  CALCIUM 9.6   CBG (last 3)  No results found for this basename: GLUCAP:3 in the last 72 hours  Wt Readings from Last 3 Encounters:  07/12/12 90.6 kg (199 lb 11.8 oz)  06/29/12 81.647 kg (180 lb)  06/29/12 81.647 kg (180 lb)    Physical Exam:  Constitutional: She is oriented to person, place, and time. She appears well-developed and well-nourished.  HENT:  Head: Normocephalic and atraumatic.  Eyes: Pupils are equal, round, and reactive to light.  Neck: Normal range of motion.  Cardiovascular: Tachycardia present. No murmur, irregular  Pulmonary/Chest: Effort normal and breath sounds normal. No wheezes, no distress  Abdominal: Normal appearance and bowel sounds are normal. She exhibits no distension.  Musculoskeletal: She exhibits edema (RLE at thigh/shin) is decreasing. Right 2nd and 3rd atrophied digits. Right leg limited in movement due to pain proximally. LLE intact for motor function. UE's grossly intact except for intrinsic weakness in the right hand Neurological: She is alert and oriented to person, place, and time.  Skin: Skin is warm and dry.  Surgical dressing right hip. Hip with bruising, probably a hematoma associated with wound also   Assessment/Plan: 1. Functional deficits secondary to right femoral neck fx which require 3+ hours per day of interdisciplinary therapy in a comprehensive inpatient rehab  setting. Physiatrist is providing close team supervision and 24 hour management of active medical problems listed below. Physiatrist and rehab team continue to assess barriers to discharge/monitor patient progress toward functional and medical goals.  As her son can only be home one week, we are now looking at short term placement FIM: FIM - Bathing Bathing Steps Patient Completed: Chest;Right Arm;Left Arm;Abdomen;Right upper leg;Left upper leg Bathing: 4: Min-Patient completes 8-9 66f 10 parts or 75+ percent  FIM - Upper Body Dressing/Undressing Upper body dressing/undressing steps patient completed: Thread/unthread right sleeve of pullover shirt/dresss;Thread/unthread left sleeve of pullover shirt/dress;Put head through opening of pull over shirt/dress;Pull shirt over trunk Upper body dressing/undressing: 5: Set-up assist to: Obtain clothing/put away FIM - Lower Body Dressing/Undressing Lower body dressing/undressing steps patient completed: Thread/unthread right pants leg;Thread/unthread left pants leg;Pull pants up/down;Don/Doff right sock;Don/Doff left sock Lower body dressing/undressing: 4: Steadying Assist  FIM - Toileting Toileting steps completed by patient: Performs perineal hygiene;Adjust clothing prior to toileting Toileting Assistive Devices: Grab bar or rail for support Toileting: 3: Mod-Patient completed 2 of 3 steps  FIM - Diplomatic Services operational officer Devices: Grab bars;Walker Toilet Transfers: 5-To toilet/BSC: Supervision (verbal cues/safety issues);5-From toilet/BSC: Supervision (verbal cues/safety issues)  FIM - Banker Devices: Therapist, occupational: 5: Supine > Sit: Supervision (verbal cues/safety issues);5: Sit > Supine: Supervision (verbal cues/safety issues);5: Bed > Chair or W/C: Supervision (verbal cues/safety issues);5: Chair or W/C > Bed: Supervision (verbal cues/safety issues)  FIM - Locomotion:  Wheelchair Locomotion: Wheelchair: 1: Travels less than 50 ft with supervision, cueing or coaxing FIM -  Locomotion: Ambulation Locomotion: Ambulation Assistive Devices: Designer, industrial/product Ambulation/Gait Assistance: 4: Min guard Locomotion: Ambulation: 1: Travels less than 50 ft with supervision/safety issues  Comprehension Comprehension Mode: Auditory Comprehension: 6-Follows complex conversation/direction: With extra time/assistive device  Expression Expression Mode: Verbal Expression: 6-Expresses complex ideas: With extra time/assistive device  Social Interaction Social Interaction: 6-Interacts appropriately with others with medication or extra time (anti-anxiety, antidepressant).  Problem Solving Problem Solving: 6-Solves complex problems: With extra time  Memory Memory: 6-More than reasonable amt of time  Medical Problem List and Plan:  1. DVT Prophylaxis/Anticoagulation: Pharmaceutical: Coumadin  2. Pain Management: control reasonable.  3. Mood: motivated without signs of distress.  4. Neuropsych: This patient is capable of making decisions on his/her own behalf.  5. Atrial fibrillation with RVR: monitor HR with bid checks. Continue coreg, digoxin, and flecainide.  6. UTI: ucx negative 7. Urinary retention: Discontinue foley and start voiding trial. Get pt to [facility] to void and cath for PVR greater than 350 cc.  8. ABLA: continue iron supplement. Stable 9. Chronic hyponatremia?: Na ranging from 127 to 129. Recheck 131 today AFTER diuretic. 10. Fluid overload: Improved post diuretics.weight trending down  -continue maxzide at full dose  -check labs again tomorrow 11. Wound care: allevyn dressing and pressure relief measures to sacrum  -can use allevyn dressing for hip    LOS (Days) 7 A FACE TO FACE EVALUATION WAS PERFORMED  Brennyn Haisley T 07/12/2012, 9:02 AM

## 2012-07-12 NOTE — Progress Notes (Signed)
ANTICOAGULATION CONSULT NOTE - F/U Consult  Pharmacy Consult:  Coumadin Indication: atrial fibrillation  Allergies  Allergen Reactions  . Sulfa Antibiotics Other (See Comments)    Madeline Serrano  . Celebrex (Celecoxib) Swelling  . Clarithromycin Swelling  . Statins Other (See Comments)    Hair fell out  . Vioxx (Rofecoxib) Other (See Comments)    Renal problems  . Penicillins Rash    Patient Measurements: Weight:  82 kg  Vital Signs: Temp: 98.4 F (36.9 C) (10/09 0500) Temp src: Oral (10/09 0500) BP: 158/53 mmHg (10/09 0500) Pulse Rate: 62  (10/09 0500)  Labs:  Basename 07/12/12 0510 07/11/12 0547 07/10/12 0530  HGB -- -- --  HCT -- -- --  PLT -- -- --  APTT -- -- --  LABPROT 25.8* 22.9* 18.7*  INR 2.50* 2.13* 1.62*  HEPARINUNFRC -- -- --  CREATININE -- 1.20* --  CKTOTAL -- -- --  CKMB -- -- --  TROPONINI -- -- --    The CrCl is unknown because both a height and weight (above a minimum accepted value) are required for this calculation.   Medical History: Past Medical History  Diagnosis Date  . A-fib   . Hypertension   . High cholesterol   . Macular degeneration        Assessment: 71 YOF s/p right hip arthroplasty and also with atrial fibrillation on Coumadin. Home Coumadin dose is 2mg  STWTS and 4mg  MF.  INR remains therapeutic; no bleeding reported.   Goal of Therapy:  INR 2-3 Monitor platelets by anticoagulation protocol: Yes    Plan:  - Coumadin 2mg  PO today - Continue daily INR for now    Madeline Serrano D. Laney Potash, PharmD, BCPS Pager:  [phone] 07/12/2012, 10:54 AM

## 2012-07-12 NOTE — Progress Notes (Signed)
Physical Therapy Note  Patient Details  Name: ONEAL GERBITZ MRN: [id number] Date of Birth: [birth year] Today's Date: 07/12/2012  [id number] (55 minutes) individual Pain : 2/10 RT LE/premedicated Focus of treatment: gait training PWB RT LE; RT LE AROM/strengthening; wc mobility training Treatment: Pt recalls 3/3 posterior hip precautions; transfers SPT RW SBA with vcs for weight bearing status; sit >< supine (mat) min assist RT LE; RT LE X 20 - ankle pumps, heel slides, SAQs (pt reports groin pain with RT LE extended on mat); gait 20 feet RW close SBA with vcs to stay closer to AD to maintain PWB RT LE; wc mobility- 120 feet SBA with min assist to maneuver in room.   Kam Rahimi,JIM 07/12/2012, 7:53 AM

## 2012-07-12 NOTE — Progress Notes (Signed)
Occupational Therapy Session Note  Patient Details  Name: Madeline Serrano MRN: [id number] Date of Birth: 1930/04/25  Today's Date: 07/12/2012 Time: 5621-3086 Time Calculation (min): 49 min  Short Term Goals: Week 1:  OT Short Term Goal 1 (Week 1): Pt will perform LB bathing with min assist using AE. OT Short Term Goal 2 (Week 1): Pt will perform LB dressing using AE with min assist. OT Short Term Goal 3 (Week 1): Pt will perfrom toilt transfers maintaining PWBing status with supervision. OT Short Term Goal 4 (Week 1): Pt will perform walk-in shower transfer with min assist to shower seat using RW.  Skilled Therapeutic Interventions/Progress Updates:    Worked on bathing and dressing during session sit to stand at the sink.  Pt needing min contact assist for sit to stand with min instructional cueing for hand placement as well.  Utilized Loss adjuster, chartered for LB dressing with min instructional cueing only.  Pt used the RW to gather her clothes as well and perform toilet transfer prior to starting her bath.  Needs increased rest breaks with standing or mobility tasks.    Therapy Documentation Precautions:  Precautions Precautions: Fall;Posterior Hip Restrictions Weight Bearing Restrictions: Yes RLE Weight Bearing: Partial weight bearing RLE Partial Weight Bearing Percentage or Pounds: 50% LLE Weight Bearing: Weight bearing as tolerated  Pain: Pain Assessment Pain Assessment: No/denies pain ADL: See FIM for current functional status  Therapy/Group: Individual Therapy  Sequoia Witz OTR/L 07/12/2012, 12:07 PM

## 2012-07-12 NOTE — Progress Notes (Signed)
Occupational Therapy Session Note  Patient Details  Name: Madeline Serrano MRN: [id number] Date of Birth: Jul 28, 1930  Today's Date: 07/12/2012 Time: 1330-1400 Time Calculation (min): 30 min  Short Term Goals: Week 1:  OT Short Term Goal 1 (Week 1): Pt will perform LB bathing with min assist using AE. OT Short Term Goal 2 (Week 1): Pt will perform LB dressing using AE with min assist. OT Short Term Goal 3 (Week 1): Pt will perfrom toilt transfers maintaining PWBing status with supervision. OT Short Term Goal 4 (Week 1): Pt will perform walk-in shower transfer with min assist to shower seat using RW.  Skilled Therapeutic Interventions/Progress Updates:  Self care retraining to include toilet transfer, toileting and wash hands while standing at sink.  Focus session on activity tolerance, dynamic standing balance, ambulating to and from bathroom, safe toilet transfers and transitional movements while maintaining PWB 50% weight bearing and maintaining THP.  Patient continues to require min-mod vcs to slow down and maintain weight bearing precautions and THP.  Provided THP handout and reviewed with patient.  Therapy Documentation Precautions:  Precautions Precautions: Fall;Posterior Hip Restrictions Weight Bearing Restrictions: Yes RLE Weight Bearing: Partial weight bearing RLE Partial Weight Bearing Percentage or Pounds: 50% LLE Weight Bearing: Weight bearing as tolerated Pain: Right groin pain, not rated, RN made aware and medication provided  See FIM for current functional status  Therapy/Group: Individual Therapy  Nekisha Mcdiarmid 07/12/2012, 4:30 PM

## 2012-07-13 ENCOUNTER — Encounter (HOSPITAL_COMMUNITY): Payer: Medicare Other

## 2012-07-13 ENCOUNTER — Inpatient Hospital Stay (HOSPITAL_COMMUNITY): Payer: Medicare Other

## 2012-07-13 ENCOUNTER — Inpatient Hospital Stay (HOSPITAL_COMMUNITY): Payer: Medicare Other | Admitting: Physical Therapy

## 2012-07-13 LAB — BASIC METABOLIC PANEL
BUN: 12 mg/dL (ref 6–23)
CO2: 27 mEq/L (ref 19–32)
Chloride: 94 mEq/L — ABNORMAL LOW (ref 96–112)
Creatinine, Ser: 1.13 mg/dL — ABNORMAL HIGH (ref 0.50–1.10)
Glucose, Bld: 113 mg/dL — ABNORMAL HIGH (ref 70–99)

## 2012-07-13 NOTE — Progress Notes (Signed)
Physical Therapy Session Note  Patient Details  Name: Madeline Serrano MRN: [id number] Date of Birth: May 03, 1930  Today's Date: 07/13/2012 Time: 4098-1191 Time Calculation (min): 27 min  Short Term Goals: Week 1:  PT Short Term Goal 1 (Week 1): Pt will perform sit <> stands with supervision consistently  PT Short Term Goal 1 - Progress (Week 1): Partly met PT Short Term Goal 2 (Week 1): Pt will ambulate 63' with RW with supervision PT Short Term Goal 2 - Progress (Week 1): Met PT Short Term Goal 3 (Week 1): Pt will perform bed <> chair transfer with supervision.  PT Short Term Goal 3 - Progress (Week 1): Met  Skilled Therapeutic Interventions/Progress Updates:    Transfers performed with overall supervision, mod verbal cues for appropriate safety and positioning. Rt. LE mat exercises: quad sets, heel slides, hip abduction/adduction, straight leg raises 2-3 x 10 reps. Rt. Hip flexor stretch, pt unable to reach full supine. Pt having some spasms with hip flexor stretch, encouraged to allow Rt. Hip to attempt to slowly stretch towards 180 degrees extension prior to sleep at night. Encouraged weight shifting in chair until supper.   Therapy Documentation Precautions:  Precautions Precautions: Fall;Posterior Hip Restrictions Weight Bearing Restrictions: Yes RLE Weight Bearing: Partial weight bearing RLE Partial Weight Bearing Percentage or Pounds: 50% LLE Weight Bearing: Weight bearing as tolerated Pain: Pain Assessment Pain Assessment: 0-10 Pain Score:   5 Pain Type: Surgical pain Pain Location: Hip Pain Orientation: Right Pain Descriptors: Aching;Spasm Pain Onset: On-going Pain Intervention(s): Repositioned   See FIM for current functional status  Therapy/Group: Individual Therapy  [name] 07/13/2012, 5:40 PM

## 2012-07-13 NOTE — Progress Notes (Signed)
ANTICOAGULATION CONSULT NOTE - F/U Consult  Pharmacy Consult:  Coumadin Indication: atrial fibrillation  Allergies  Allergen Reactions  . Sulfa Antibiotics Other (See Comments)    [name]  . Celebrex (Celecoxib) Swelling  . Clarithromycin Swelling  . Statins Other (See Comments)    Hair fell out  . Vioxx (Rofecoxib) Other (See Comments)    Renal problems  . Penicillins Rash    Patient Measurements: Weight:  82 kg  Vital Signs: Temp: 97.6 F (36.4 C) (10/10 0504) Temp src: Oral (10/10 0504) BP: 151/79 mmHg (10/10 0504) Pulse Rate: 63  (10/10 0504)  Labs:  Basename 07/13/12 0700 07/12/12 0510 07/11/12 0547  HGB -- -- --  HCT -- -- --  PLT -- -- --  APTT -- -- --  LABPROT 29.1* 25.8* 22.9*  INR 2.94* 2.50* 2.13*  HEPARINUNFRC -- -- --  CREATININE 1.13* -- 1.20*  CKTOTAL -- -- --  CKMB -- -- --  TROPONINI -- -- --    The CrCl is unknown because both a height and weight (above a minimum accepted value) are required for this calculation.       Assessment: 40 YOF s/p right hip arthroplasty and also with atrial fibrillation on Coumadin. Home Coumadin dose is 2mg  STWTS and 4mg  MF.  INR remains therapeutic but is trending up; no bleeding reported.   Goal of Therapy:  INR 2-3 Monitor platelets by anticoagulation protocol: Yes    Plan:  - Hold Coumadin today and resume in AM - Continue daily INR    Jarissa Sheriff D. Laney Potash, PharmD, BCPS Pager:  [phone] 07/13/2012, 10:48 AM

## 2012-07-13 NOTE — Progress Notes (Signed)
Subjective/Complaints:  Feeling better. Diuresing, but still "positive" on i's and o's  A 12 point review of systems has been performed and if not noted above is otherwise negative.   Objective: Vital Signs: Blood pressure 151/79, pulse 63, temperature 97.6 F (36.4 C), temperature source Oral, resp. rate 18, weight 87.2 kg (192 lb 3.9 oz), SpO2 96.00%. No results found. No results found for this basename: WBC:2,HGB:2,HCT:2,PLT:2 in the last 72 hours  Basename 07/13/12 0700 07/11/12 0547  NA 130* 131*  K 4.4 4.2  CL 94* 97  CO2 27 25  GLUCOSE 113* 105*  BUN 12 15  CREATININE 1.13* 1.20*  CALCIUM 9.7 9.6   CBG (last 3)  No results found for this basename: GLUCAP:3 in the last 72 hours  Wt Readings from Last 3 Encounters:  07/13/12 87.2 kg (192 lb 3.9 oz)  06/29/12 81.647 kg (180 lb)  06/29/12 81.647 kg (180 lb)    Physical Exam:  Constitutional: She is oriented to person, place, and time. She appears well-developed and well-nourished.  HENT:  Head: Normocephalic and atraumatic.  Eyes: Pupils are equal, round, and reactive to light.  Neck: Normal range of motion.  Cardiovascular: Tachycardia present. No murmur, regular Pulmonary/Chest: Effort normal and breath sounds normal. No wheezes, no distress  Abdominal: Normal appearance and bowel sounds are normal. She exhibits no distension.  Musculoskeletal: She exhibits edema (RLE at thigh/shin) is decreasing. Right 2nd and 3rd atrophied digits. Right leg limited in movement due to pain proximally. LLE intact for motor function. UE's grossly intact except for intrinsic weakness in the right hand Neurological: She is alert and oriented to person, place, and time.  Skin: Skin is warm and dry.  Surgical dressing right hip. Hip with bruising, probably a hematoma associated with wound also   Assessment/Plan: 1. Functional deficits secondary to right femoral neck fx which require 3+ hours per day of interdisciplinary therapy in a  comprehensive inpatient rehab setting. Physiatrist is providing close team supervision and 24 hour management of active medical problems listed below. Physiatrist and rehab team continue to assess barriers to discharge/monitor patient progress toward functional and medical goals.  Working on snf placement--ok to transfer from my view  FIM: FIM - Bathing Bathing Steps Patient Completed: Chest;Right Arm;Left Arm;Abdomen;Front perineal area;Buttocks;Right upper leg;Left upper leg Bathing: 4: Min-Patient completes 8-9 25f 10 parts or 75+ percent  FIM - Upper Body Dressing/Undressing Upper body dressing/undressing steps patient completed: Thread/unthread right sleeve of pullover shirt/dresss;Thread/unthread left sleeve of pullover shirt/dress;Put head through opening of pull over shirt/dress;Pull shirt over trunk Upper body dressing/undressing: 5: Set-up assist to: Obtain clothing/put away FIM - Lower Body Dressing/Undressing Lower body dressing/undressing steps patient completed: Thread/unthread right pants leg;Thread/unthread left pants leg;Pull pants up/down;Don/Doff right sock;Don/Doff left sock Lower body dressing/undressing: 4: Steadying Assist  FIM - Toileting Toileting steps completed by patient: Adjust clothing prior to toileting;Performs perineal hygiene;Adjust clothing after toileting Toileting Assistive Devices: Grab bar or rail for support Toileting: 4: Steadying assist  FIM - Diplomatic Services operational officer Devices: Bedside commode Toilet Transfers: 5-To toilet/BSC: Supervision (verbal cues/safety issues);5-From toilet/BSC: Supervision (verbal cues/safety issues)  FIM - Bed/Chair Transfer Bed/Chair Transfer Assistive Devices: Therapist, occupational: 5: Supine > Sit: Supervision (verbal cues/safety issues);5: Bed > Chair or W/C: Supervision (verbal cues/safety issues)  FIM - Locomotion: Wheelchair Locomotion: Wheelchair: 1: Travels less than 50 ft with  supervision, cueing or coaxing FIM - Locomotion: Ambulation Locomotion: Ambulation Assistive Devices: Designer, industrial/product Ambulation/Gait Assistance: 4: Min guard Locomotion:  Ambulation: 1: Travels less than 50 ft with supervision/safety issues  Comprehension Comprehension Mode: Auditory Comprehension: 5-Understands complex 90% of the time/Cues < 10% of the time  Expression Expression Mode: Verbal Expression: 5-Expresses complex 90% of the time/cues < 10% of the time  Social Interaction Social Interaction: 6-Interacts appropriately with others with medication or extra time (anti-anxiety, antidepressant).  Problem Solving Problem Solving: 5-Solves basic problems: With no assist  Memory Memory: 5-Recognizes or recalls 90% of the time/requires cueing < 10% of the time  Medical Problem List and Plan:  1. DVT Prophylaxis/Anticoagulation: Pharmaceutical: Coumadin  2. Pain Management: control reasonable.  3. Mood: motivated without signs of distress.  4. Neuropsych: This patient is capable of making decisions on his/her own behalf.  5. Atrial fibrillation with RVR: monitor HR with bid checks. Continue coreg, digoxin, and flecainide.  6. UTI: ucx negative 7. Urinary retention: showing some improvement 8. ABLA: continue iron supplement. Stable 9. Chronic hyponatremia?: sodium 130 10. Fluid overload: Improved post diuretics.weight trending down and she's feeling better  -continue maxzide at full dose 11. Wound care: allevyn dressing and pressure relief measures to sacrum  -can use allevyn dressing for hip    LOS (Days) 8 A FACE TO FACE EVALUATION WAS PERFORMED  Wanda Cellucci T 07/13/2012, 8:27 AM

## 2012-07-13 NOTE — Progress Notes (Signed)
Physical Therapy Weekly Progress Note  Patient Details  Name: Madeline Serrano MRN: [id number] Date of Birth: 17-Oct-1929  Today's Date: 07/13/2012 Time: 4098-1191 Time Calculation (min): 58 min  Patient has met 2 of 3 short term goals.  Pt did not meet goals for sit <> stand with supervision consistantly. Pt occasionally looses balance and requires min assist to correct and prevent a fall.   Patient continues to demonstrate the following deficits: impaired balance, decreased functional mobility, impaired endurance, increased need for assist with functional activity and therefore will continue to benefit from skilled PT intervention to enhance overall performance with activity tolerance, balance, ability to compensate for deficits, awareness and knowledge of precautions.  Patient progressing toward long term goals..  Continue plan of care.  PT Short Term Goals Week 1:  PT Short Term Goal 1 (Week 1): Pt will perform sit <> stands with supervision consistently  PT Short Term Goal 1 - Progress (Week 1): Partly met PT Short Term Goal 2 (Week 1): Pt will ambulate 10' with RW with supervision PT Short Term Goal 2 - Progress (Week 1): Met PT Short Term Goal 3 (Week 1): Pt will perform bed <> chair transfer with supervision.  PT Short Term Goal 3 - Progress (Week 1): Met  Skilled Therapeutic Interventions/Progress Updates:    Ambulation x 55', 30' with RW and supervision. Sit <> stand performed with supervision, once pt needed min assist for loss of balance in room. Curb step performed simulation home step up x 3 reps with up to min assist, cues/asssist for RW positioning and safety, pt able to recall sequencing for LEs. Scavenger hunt for horseshoes at varying heights practicing negotiation of RW and maintaining precautions, performed with min assist. Bathroom mobility performed with supervision however pt required assist with cleaning and pulling pants up.   Therapy Documentation Precautions:    Precautions Precautions: Fall;Posterior Hip Restrictions Weight Bearing Restrictions: Yes RLE Weight Bearing: Partial weight bearing RLE Partial Weight Bearing Percentage or Pounds: 50% LLE Weight Bearing: Weight bearing as tolerated Pain: Pain Assessment Pain Assessment: 0-10 Pain Score:   3 Pain Type: Surgical pain Pain Location: Hip Pain Orientation: Right Pain Descriptors: Aching Pain Onset: With Activity Pain Intervention(s): Repositioned;Rest;Other (Comment) (rest  as needed)  See FIM for current functional status  Therapy/Group: Individual Therapy  [name] 07/13/2012, 12:36 PM

## 2012-07-13 NOTE — Progress Notes (Signed)
Occupational Therapy Session Note  Patient Details  Name: Madeline Serrano MRN: [id number] Date of Birth: May 22, 1930  Today's Date: 07/13/2012  Session 1 Time: 0700-0755 Time Calculation (min): 55 min   Skilled Therapeutic Interventions/Progress Updates:    Pt in bed upon arrival but agreeable to engaging in bathing and dressing tasks at w/c level at sink.  Pt recalled 3/3 hip precautions.  Pt stated weight bearing precautions but required min verbal cues to adhere while ambulating to w/c at sink.  Pt very anxious throughout session but able to perform tasks with supervision/min A.  Pt stated she needed to use BSC and transferred with steady assist.  Focus on activity tolerance, safety awareness, AE use, and standing balance.  Therapy Documentation Precautions:  Precautions Precautions: Fall;Posterior Hip Restrictions Weight Bearing Restrictions: Yes RLE Weight Bearing: Partial weight bearing RLE Partial Weight Bearing Percentage or Pounds: 50% LLE Weight Bearing: Weight bearing as tolerated    See FIM for current functional status  Pt denies pain  Therapy/Group: Individual Therapy  Session 2 Time: 1330-1415 Pt c/o 4/10 pain with activity; RN aware and repositioned Individual therapy  Pt engaged in home mgmt tasks in ADL apartment including gathering clothing items from drawers and from closet.  Instructed patient in correct/safe method for transferring objects while using RW.  Although pt discharging to SNF for continued therapy before going home therapist discussed home and kitchen setup.  Pt exhibited SOB after approx 3 mins activity and required rest breaks.  O2 sats>95% on RA.  Pt transitioned to therex with theraband to increase UB strength/endurance.  Pt returned demonstrated therex after demonstration.  Focus on activity tolerance, functinal ambulation, safety awareness, and standing balance.  Lavone Neri [facility] 07/13/2012, 7:56 AM

## 2012-07-14 ENCOUNTER — Inpatient Hospital Stay (HOSPITAL_COMMUNITY): Payer: Medicare Other | Admitting: Physical Therapy

## 2012-07-14 ENCOUNTER — Inpatient Hospital Stay (HOSPITAL_COMMUNITY): Payer: Medicare Other

## 2012-07-14 DIAGNOSIS — E871 Hypo-osmolality and hyponatremia: Secondary | ICD-10-CM

## 2012-07-14 DIAGNOSIS — W19XXXA Unspecified fall, initial encounter: Secondary | ICD-10-CM

## 2012-07-14 DIAGNOSIS — Z5189 Encounter for other specified aftercare: Secondary | ICD-10-CM

## 2012-07-14 DIAGNOSIS — D62 Acute posthemorrhagic anemia: Secondary | ICD-10-CM

## 2012-07-14 DIAGNOSIS — I4891 Unspecified atrial fibrillation: Secondary | ICD-10-CM

## 2012-07-14 DIAGNOSIS — I509 Heart failure, unspecified: Secondary | ICD-10-CM

## 2012-07-14 DIAGNOSIS — S72009A Fracture of unspecified part of neck of unspecified femur, initial encounter for closed fracture: Secondary | ICD-10-CM

## 2012-07-14 LAB — PROTIME-INR: Prothrombin Time: 27.5 seconds — ABNORMAL HIGH (ref 11.6–15.2)

## 2012-07-14 MED ORDER — WARFARIN SODIUM 4 MG PO TABS
4.0000 mg | ORAL_TABLET | ORAL | Status: DC
  Administered 2012-07-14: 4 mg via ORAL
  Filled 2012-07-14: qty 1

## 2012-07-14 MED ORDER — WARFARIN SODIUM 2 MG PO TABS
2.0000 mg | ORAL_TABLET | ORAL | Status: DC
  Administered 2012-07-15: 2 mg via ORAL
  Filled 2012-07-14 (×2): qty 1

## 2012-07-14 NOTE — Progress Notes (Signed)
Occupational Therapy Session Note  Patient Details  Name: Madeline Serrano MRN: [id number] Date of Birth: 1930/04/11  Today's Date: 07/14/2012 Time: 0700-0755 Time Calculation (min): 55 min  Short Term Goals: Week 1:  OT Short Term Goal 1 (Week 1): Pt will perform LB bathing with min assist using AE. OT Short Term Goal 2 (Week 1): Pt will perform LB dressing using AE with min assist. OT Short Term Goal 3 (Week 1): Pt will perfrom toilt transfers maintaining PWBing status with supervision. OT Short Term Goal 4 (Week 1): Pt will perform walk-in shower transfer with min assist to shower seat using RW.  Skilled Therapeutic Interventions/Progress Updates:    Pt in bed upon arrival but immediately sat EOB in preparation for amb with RW to w/c at sink.  Pt stated she needed to use BSC on the way to sink.  Pt completed bathing and dressing tasks with sit>stand from w/c.  Pt uses reacher and sock aid appropriately for assisting with dressing tasks.  Pt recalls and adheres to hip precautions and weight bearing precautions.  Focus on activity tolerance, functional ambulation, standing balance, and safety awareness.  Therapy Documentation Precautions:  Precautions Precautions: Fall;Posterior Hip Restrictions Weight Bearing Restrictions: Yes RLE Weight Bearing: Partial weight bearing RLE Partial Weight Bearing Percentage or Pounds: 50% LLE Weight Bearing: Weight bearing as tolerated Pain: Pain Assessment Pain Assessment: 0-10 Pain Score:   2 Pain Type: Surgical pain Pain Location: Hip Pain Orientation: Right Pain Descriptors: Aching Pain Onset: On-going  See FIM for current functional status  Therapy/Group: Individual Therapy  Rich Brave 07/14/2012, 7:56 AM

## 2012-07-14 NOTE — Progress Notes (Signed)
Physical Therapy Session Note  Patient Details  Name: FOLASHADE WINCEK MRN: [id number] Date of Birth: 1930/06/30  Today's Date: 07/14/2012 Time: 1478-2956 Time Calculation (min): 45 min  Short Term Goals: Week 2:   = unmet long term goals  Skilled Therapeutic Interventions/Progress Updates:    Ambulation x 30', 78' with RW and close supervision, increased fatigue and decreased ability to ambulate far distances this afternoon. Standing balance and activity tolerance, negotiating with RW over carpet while watering plants. Discussed safety with moving items along counters alternating with gait and moving RW. Pt requires frequent rest breaks throughout session. Pt becomes anxious when she feels she needs to sit and rest, often reaches back  for wheelchair to sit prior to therapist locking brakes. Patient provided with emotional support as she expressed feeling overwhelmed with having so many "things wrong" referencing her comorbidities.     Therapy Documentation Precautions:  Precautions Precautions: Fall;Posterior Hip Restrictions Weight Bearing Restrictions: Yes RLE Weight Bearing: Partial weight bearing RLE Partial Weight Bearing Percentage or Pounds: 50% LLE Weight Bearing: Weight bearing as tolerated Pain: Pain Assessment Pain Score:   3 Pain Type: Surgical pain Pain Location: Hip Pain Orientation: Right Pain Descriptors: Sore Pain Onset: On-going Pain Intervention(s): Repositioned  See FIM for current functional status  Therapy/Group: Individual Therapy  [name] 07/14/2012, 4:46 PM

## 2012-07-14 NOTE — Progress Notes (Signed)
Subjective/Complaints:  Feeling better. Happy that her weight's down  A 12 point review of systems has been performed and if not noted above is otherwise negative.   Objective: Vital Signs: Blood pressure 139/70, pulse 65, temperature 97.5 F (36.4 C), temperature source Oral, resp. rate 18, weight 85.9 kg (189 lb 6 oz), SpO2 96.00%. No results found. No results found for this basename: WBC:2,HGB:2,HCT:2,PLT:2 in the last 72 hours  Basename 07/13/12 0700  NA 130*  K 4.4  CL 94*  CO2 27  GLUCOSE 113*  BUN 12  CREATININE 1.13*  CALCIUM 9.7   CBG (last 3)  No results found for this basename: GLUCAP:3 in the last 72 hours  Wt Readings from Last 3 Encounters:  07/14/12 85.9 kg (189 lb 6 oz)  06/29/12 81.647 kg (180 lb)  06/29/12 81.647 kg (180 lb)    Physical Exam:  Constitutional: She is oriented to person, place, and time. She appears well-developed and well-nourished.  HENT:  Head: Normocephalic and atraumatic.  Eyes: Pupils are equal, round, and reactive to light.  Neck: Normal range of motion.  Cardiovascular: Tachycardia present. No murmur, regular Pulmonary/Chest: Effort normal and breath sounds normal. No wheezes, no distress  Abdominal: Normal appearance and bowel sounds are normal. She exhibits no distension.  Musculoskeletal: She exhibits edema (RLE at thigh/shin) is decreasing. Right 2nd and 3rd atrophied digits. Right leg limited in movement due to pain proximally. LLE intact for motor function. UE's grossly intact except for intrinsic weakness in the right hand Neurological: She is alert and oriented to person, place, and time.  Skin: Skin is warm and dry.  Surgical dressing right hip. Hip with bruising, probably a hematoma associated with wound also   Assessment/Plan: 1. Functional deficits secondary to right femoral neck fx which require 3+ hours per day of interdisciplinary therapy in a comprehensive inpatient rehab setting. Physiatrist is providing  close team supervision and 24 hour management of active medical problems listed below. Physiatrist and rehab team continue to assess barriers to discharge/monitor patient progress toward functional and medical goals.     FIM: FIM - Bathing Bathing Steps Patient Completed: Chest;Right Arm;Left Arm;Abdomen;Front perineal area;Buttocks;Right upper leg;Left upper leg Bathing: 4: Min-Patient completes 8-9 33f 10 parts or 75+ percent  FIM - Upper Body Dressing/Undressing Upper body dressing/undressing steps patient completed: Thread/unthread right sleeve of pullover shirt/dresss;Thread/unthread left sleeve of pullover shirt/dress;Put head through opening of pull over shirt/dress;Pull shirt over trunk Upper body dressing/undressing: 5: Set-up assist to: Obtain clothing/put away FIM - Lower Body Dressing/Undressing Lower body dressing/undressing steps patient completed: Thread/unthread right pants leg;Thread/unthread left pants leg;Pull pants up/down;Don/Doff right sock;Don/Doff left sock Lower body dressing/undressing: 4: Steadying Assist  FIM - Toileting Toileting steps completed by patient: Adjust clothing prior to toileting;Performs perineal hygiene;Adjust clothing after toileting Toileting Assistive Devices: Grab bar or rail for support Toileting: 4: Steadying assist  FIM - Diplomatic Services operational officer Devices: Bedside commode Toilet Transfers: 5-To toilet/BSC: Supervision (verbal cues/safety issues);5-From toilet/BSC: Supervision (verbal cues/safety issues)  FIM - Bed/Chair Transfer Bed/Chair Transfer Assistive Devices: Therapist, occupational: 5: Supine > Sit: Supervision (verbal cues/safety issues);5: Bed > Chair or W/C: Supervision (verbal cues/safety issues)  FIM - Locomotion: Wheelchair Locomotion: Wheelchair: 0: Activity did not occur FIM - Locomotion: Ambulation Locomotion: Ambulation Assistive Devices: Designer, industrial/product Ambulation/Gait Assistance: 5:  Supervision Locomotion: Ambulation: 2: Travels 50 - 149 ft with supervision/safety issues  Comprehension Comprehension Mode: Auditory Comprehension: 5-Understands complex 90% of the time/Cues < 10% of the time  Expression Expression Mode: Verbal Expression: 5-Expresses complex 90% of the time/cues < 10% of the time  Social Interaction Social Interaction: 6-Interacts appropriately with others with medication or extra time (anti-anxiety, antidepressant).  Problem Solving Problem Solving: 5-Solves basic problems: With no assist  Memory Memory: 5-Recognizes or recalls 90% of the time/requires cueing < 10% of the time  Medical Problem List and Plan:  1. DVT Prophylaxis/Anticoagulation: Pharmaceutical: Coumadin  2. Pain Management: control reasonable.  3. Mood: motivated without signs of distress.  4. Neuropsych: This patient is capable of making decisions on his/her own behalf.  5. Atrial fibrillation with RVR: monitor HR with bid checks. Continue coreg, digoxin, and flecainide.  6. UTI: ucx negative 7. Urinary retention: showing some improvement 8. ABLA: continue iron supplement. Stable 9. Chronic hyponatremia?: sodium 130 10. Fluid overload: Improved post diuretics.weight trending down and she's feeling better  -continue maxzide at full dose  -sodium is holding up 11. Wound care: allevyn dressing and pressure relief measures to sacrum  -can use allevyn dressing for hip    LOS (Days) 9 A FACE TO FACE EVALUATION WAS PERFORMED  SWARTZ,ZACHARY T 07/14/2012, 8:10 AM

## 2012-07-14 NOTE — Progress Notes (Signed)
Occupational Therapy Weekly Progress Note  Patient Details  Name: Madeline Serrano MRN: [id number] Date of Birth: 09/08/1930  Today's Date: 07/14/2012  Patient has met 4 of 4 short term goals. Pt has made steady progress with bathing, dressing, toilet transfers and toileting.  Pt is using AE (reacher and sock aid) appropriately to assist with LB dressing.  Pt recalls and adheres to hip precautions and weight bearing precautions.  Pt is supervision for UB bathing and dressing, min A/steady A for LB bathing and dressing, supervision for toilet transfers and walk-in shower transfers, and steady assist for toileting.  Pt has elected to go to SNF on a short term basis before heading home since she will not have consistent supervision/assistance at home.  Patient continues to demonstrate the following deficits: decreased balance, decreased activity tolerance, decreased strength, and therefore will continue to benefit from skilled OT intervention to enhance overall performance with BADL.  Patient progressing toward long term goals..  Continue plan of care.  OT Short Term Goals Week 1:  OT Short Term Goal 1 (Week 1): Pt will perform LB bathing with min assist using AE. OT Short Term Goal 1 - Progress (Week 1): Met OT Short Term Goal 2 (Week 1): Pt will perform LB dressing using AE with min assist. OT Short Term Goal 2 - Progress (Week 1): Met OT Short Term Goal 3 (Week 1): Pt will perfrom toilt transfers maintaining PWBing status with supervision. OT Short Term Goal 3 - Progress (Week 1): Met OT Short Term Goal 4 (Week 1): Pt will perform walk-in shower transfer with min assist to shower seat using RW. OT Short Term Goal 4 - Progress (Week 1): Met   Therapy Documentation Precautions:  Precautions Precautions: Fall;Posterior Hip Restrictions Weight Bearing Restrictions: Yes RLE Weight Bearing: Partial weight bearing RLE Partial Weight Bearing Percentage or Pounds: 50% LLE Weight Bearing: Weight  bearing as tolerated ADL: ADL Equipment Provided: Reacher;Sock aid Eating: Modified independent Where Assessed-Eating: Wheelchair Grooming: Supervision/safety Where Assessed-Grooming: Sitting at sink;Wheelchair Upper Body Bathing: Setup Where Assessed-Upper Body Bathing: Sitting at sink;Wheelchair Lower Body Bathing: Minimal assistance Where Assessed-Lower Body Bathing: Sitting at sink;Standing at sink;Wheelchair Upper Body Dressing: Setup Where Assessed-Upper Body Dressing: Sitting at sink;Wheelchair Lower Body Dressing: Contact guard Where Assessed-Lower Body Dressing: Sitting at sink;Standing at sink;Wheelchair Toileting: Contact guard Where Assessed-Toileting: Psychiatrist Transfer: Close supervision Toilet Transfer Method: Freight forwarder Equipment: Bedside commode  See FIM for current functional status   Rich Brave 07/14/2012, 9:08 AM   Perrin Maltese, OTR/L Pager number [phone] 07/14/2012

## 2012-07-14 NOTE — Progress Notes (Signed)
Physical Therapy Session Note  Patient Details  Name: KYLYNN STREET MRN: [id number] Date of Birth: 1930/05/27  Today's Date: 07/14/2012 Time: 0900-0958 Time Calculation (min): 58 min  Short Term Goals: Week 2:   = unmet long term goals  Skilled Therapeutic Interventions/Progress Updates:    Ambulation to/from bathroom with RW, cues needed for safety particularly with transition from/from wheelchair. Ambulation x 75', 58' with RW, close supervision cues for RW distance. [place] exercise program modified for precautions (seated long arc quads; standing Rt. hip abduction, knee flexion, mini squats, plantar/dorsiflexion) 3 x 10 reps each. Pt to perform at home after SNF stay, pt understands that she is not to perform on her own yet.   Pt continues to require frequent rest breaks secondary to Lt. LE weakness and shortness of breath.    Therapy Documentation Precautions:  Precautions Precautions: Fall;Posterior Hip Restrictions Weight Bearing Restrictions: Yes RLE Weight Bearing: Partial weight bearing RLE Partial Weight Bearing Percentage or Pounds: 50% LLE Weight Bearing: Weight bearing as tolerated Pain: Pain Assessment Pain Assessment: No/denies pain Pain Score:   3 Pain Type: Surgical pain Pain Location: Hip Pain Orientation: Right Pain Descriptors: Aching Pain Onset: With Activity Pain Intervention(s): Repositioned;Rest  See FIM for current functional status  Therapy/Group: Individual Therapy  [name] 07/14/2012, 12:23 PM

## 2012-07-14 NOTE — Progress Notes (Signed)
Occupational Therapy Note  Patient Details  Name: Madeline Serrano MRN: [id number] Date of Birth: 06-07-1930 Today's Date: 07/14/2012  Time: 1345-1415 Pt denies pain Individual therapy  Pt engaged in kitchen mobility activities with focus on side stepping, reaching for items at various heights, transferring objects along counter tops, and safety awareness.  Recommended to patient to obtain tall stool to sit on during meal preparation in kitchen.  Recommended to patient to relocate commonly used items to a level easily accessed.  Pt continues to required increased time to complete tasks with multiple rest breaks. Lavone Neri [facility] 07/14/2012, 3:34 PM

## 2012-07-14 NOTE — Progress Notes (Signed)
ANTICOAGULATION CONSULT NOTE - F/U Consult  Pharmacy Consult:  Coumadin Indication: atrial fibrillation  Allergies  Allergen Reactions  . Sulfa Antibiotics Other (See Comments)    [name]  . Celebrex (Celecoxib) Swelling  . Clarithromycin Swelling  . Statins Other (See Comments)    Hair fell out  . Vioxx (Rofecoxib) Other (See Comments)    Renal problems  . Penicillins Rash    Patient Measurements: Weight:  82 kg  Vital Signs: Temp: 97.5 F (36.4 C) (10/11 0519) Temp src: Oral (10/11 0519) BP: 139/70 mmHg (10/11 0519) Pulse Rate: 68  (10/11 0851)  Labs:  Basename 07/14/12 0620 07/13/12 0700 07/12/12 0510  HGB -- -- --  HCT -- -- --  PLT -- -- --  APTT -- -- --  LABPROT 27.5* 29.1* 25.8*  INR 2.72* 2.94* 2.50*  HEPARINUNFRC -- -- --  CREATININE -- 1.13* --  CKTOTAL -- -- --  CKMB -- -- --  TROPONINI -- -- --    The CrCl is unknown because both a height and weight (above a minimum accepted value) are required for this calculation.   Assessment: 67 YOF s/p right hip arthroplasty and also with atrial fibrillation on Coumadin. Home Coumadin dose is 2mg  STWTS and 4mg  MF.   Anticoag: Coumadin for hx Afib and VTE px s/p right THA, INR 2.72  Cards: Afib / HTN - BP elevated, HR stable 139/70, HR 68 - on Coreg, digoxin (level ok at 0.7 on 10/7), flecainide, Maxzide   Renal: SCr down 1.13, chronic hyponatremia, CL also low  Pulm: stable on RA, on Spiriva  Heme/Onc: hgb 9.2 - PO iron, plts WNL   Goal of Therapy:  INR 2-3 Monitor platelets by anticoagulation protocol: Yes  Plan:  Resume home Coumadin dosing of 4mg  MF and 2mg  other days. - Continue daily INR    07/14/2012, 11:12 AM

## 2012-07-15 ENCOUNTER — Inpatient Hospital Stay (HOSPITAL_COMMUNITY): Payer: Medicare Other | Admitting: *Deleted

## 2012-07-15 LAB — PROTIME-INR: INR: 2.91 — ABNORMAL HIGH (ref 0.00–1.49)

## 2012-07-15 MED ORDER — METHOCARBAMOL 500 MG PO TABS
1000.0000 mg | ORAL_TABLET | Freq: Once | ORAL | Status: AC
  Administered 2012-07-15: 1000 mg via ORAL

## 2012-07-15 NOTE — Progress Notes (Signed)
ANTICOAGULATION CONSULT NOTE - F/U Consult  Pharmacy Consult:  Coumadin Indication: atrial fibrillation  Allergies  Allergen Reactions  . Sulfa Antibiotics Other (See Comments)    [name]  . Celebrex (Celecoxib) Swelling  . Clarithromycin Swelling  . Statins Other (See Comments)    Hair fell out  . Vioxx (Rofecoxib) Other (See Comments)    Renal problems  . Penicillins Rash    Patient Measurements: Weight:  82 kg  Vital Signs: Temp: 97.9 F (36.6 C) (10/12 0358) Temp src: Oral (10/12 0358) BP: 165/76 mmHg (10/12 0929) Pulse Rate: 63  (10/12 0929)  Labs:  Basename 07/15/12 0646 07/14/12 0620 07/13/12 0700  HGB -- -- --  HCT -- -- --  PLT -- -- --  APTT -- -- --  LABPROT 28.9* 27.5* 29.1*  INR 2.91* 2.72* 2.94*  HEPARINUNFRC -- -- --  CREATININE -- -- 1.13*  CKTOTAL -- -- --  CKMB -- -- --  TROPONINI -- -- --    The CrCl is unknown because both a height and weight (above a minimum accepted value) are required for this calculation.   Assessment: 37 YOF s/p right hip arthroplasty and also with atrial fibrillation on Coumadin. Home Coumadin dose is 2mg  STWTS and 4mg  MF.   Anticoag: Coumadin for hx Afib and VTE px s/p right THA, INR 2.91, has been increasing in response to higher Coumadin doses than her home regimen.  Cards: Afib / HTN - BP elevated, HR stable - on Coreg, digoxin (level ok at 0.7 on 10/7), flecainide, Maxzide   Renal: SCr down 1.13, chronic hyponatremia, CL also low  Pulm: stable on RA, on Spiriva  Heme/Onc: hgb 9.2 - PO iron, plts WNL   Goal of Therapy:  INR 2-3 Monitor platelets by anticoagulation protocol: Yes  Plan:  - Continue home Coumadin dosing of 4mg  MF and 2mg  other days. - Continue daily INR  Thanks, Sharief Wainwright K. Allena Katz, PharmD, BCPS.  Clinical Pharmacist Pager [phone]. 07/15/2012 1:48 PM

## 2012-07-15 NOTE — Progress Notes (Signed)
Subjective/Complaints:  No new complaints  A 12 point review of systems has been performed and if not noted above is otherwise negative.   Objective: Vital Signs: Blood pressure 157/58, pulse 64, temperature 97.9 F (36.6 C), temperature source Oral, resp. rate 19, weight 84.3 kg (185 lb 13.6 oz), SpO2 98.00%. No results found. No results found for this basename: WBC:2,HGB:2,HCT:2,PLT:2 in the last 72 hours  Basename 07/13/12 0700  NA 130*  K 4.4  CL 94*  CO2 27  GLUCOSE 113*  BUN 12  CREATININE 1.13*  CALCIUM 9.7   CBG (last 3)  No results found for this basename: GLUCAP:3 in the last 72 hours  Wt Readings from Last 3 Encounters:  07/15/12 84.3 kg (185 lb 13.6 oz)  06/29/12 81.647 kg (180 lb)  06/29/12 81.647 kg (180 lb)    Physical Exam:  Constitutional: She is oriented to person, place, and time. She appears well-developed and well-nourished.  HENT:  Head: Normocephalic and atraumatic.  Eyes: Pupils are equal, round, and reactive to light.  Neck: Normal range of motion.  Cardiovascular: Tachycardia present. No murmur, regular Pulmonary/Chest: Effort normal and breath sounds normal. No wheezes, no distress  Abdominal: Normal appearance and bowel sounds are normal. She exhibits no distension.  Musculoskeletal: She exhibits edema (RLE at thigh/shin) is decreasing. Right 2nd and 3rd atrophied digits. Right leg limited in movement due to pain proximally. LLE intact for motor function. UE's grossly intact except for intrinsic weakness in the right hand Neurological: She is alert and oriented to person, place, and time.  Skin: Skin is warm and dry.  Surgical dressing right hip. Wound clean   Assessment/Plan: 1. Functional deficits secondary to right femoral neck fx which require 3+ hours per day of interdisciplinary therapy in a comprehensive inpatient rehab setting. Physiatrist is providing close team supervision and 24 hour management of active medical problems listed  below. Physiatrist and rehab team continue to assess barriers to discharge/monitor patient progress toward functional and medical goals.     FIM: FIM - Bathing Bathing Steps Patient Completed: Chest;Right Arm;Left Arm;Abdomen;Front perineal area;Buttocks;Right upper leg;Left upper leg Bathing: 4: Min-Patient completes 8-9 38f 10 parts or 75+ percent  FIM - Upper Body Dressing/Undressing Upper body dressing/undressing steps patient completed: Thread/unthread right sleeve of pullover shirt/dresss;Thread/unthread left sleeve of pullover shirt/dress;Put head through opening of pull over shirt/dress;Pull shirt over trunk Upper body dressing/undressing: 5: Set-up assist to: Obtain clothing/put away FIM - Lower Body Dressing/Undressing Lower body dressing/undressing steps patient completed: Thread/unthread right pants leg;Thread/unthread left pants leg;Pull pants up/down;Don/Doff right sock;Don/Doff left sock Lower body dressing/undressing: 4: Steadying Assist  FIM - Toileting Toileting steps completed by patient: Performs perineal hygiene;Adjust clothing prior to toileting Toileting Assistive Devices: Grab bar or rail for support Toileting: 3: Mod-Patient completed 2 of 3 steps  FIM - Diplomatic Services operational officer Devices: Bedside commode Toilet Transfers: 3-To toilet/BSC: Mod A (lift or lower assist);3-From toilet/BSC: Mod A (lift or lower assist)  FIM - Bed/Chair Transfer Bed/Chair Transfer Assistive Devices: Therapist, occupational: 5: Supine > Sit: Supervision (verbal cues/safety issues);5: Bed > Chair or W/C: Supervision (verbal cues/safety issues)  FIM - Locomotion: Wheelchair Locomotion: Wheelchair: 0: Activity did not occur FIM - Locomotion: Ambulation Locomotion: Ambulation Assistive Devices: Designer, industrial/product Ambulation/Gait Assistance: 5: Supervision Locomotion: Ambulation: 2: Travels 50 - 149 ft with supervision/safety issues  Comprehension Comprehension  Mode: Auditory Comprehension: 5-Understands basic 90% of the time/requires cueing < 10% of the time  Expression Expression Mode: Verbal Expression:  5-Expresses complex 90% of the time/cues < 10% of the time  Social Interaction Social Interaction: 6-Interacts appropriately with others with medication or extra time (anti-anxiety, antidepressant).  Problem Solving Problem Solving: 5-Solves basic problems: With no assist  Memory Memory: 5-Recognizes or recalls 90% of the time/requires cueing < 10% of the time  Medical Problem List and Plan:  1. DVT Prophylaxis/Anticoagulation: Pharmaceutical: Coumadin  2. Pain Management: control reasonable.  3. Mood: motivated without signs of distress.  4. Neuropsych: This patient is capable of making decisions on his/her own behalf.  5. Atrial fibrillation with RVR: monitor HR with bid checks. Continue coreg, digoxin, and flecainide.  6. UTI: ucx negative 7. Urinary retention: showing some improvement 8. ABLA: continue iron supplement. Stable 9. Chronic hyponatremia?: sodium 130 10. Fluid overload: Improved post diuretics.weight trending down and she's feeling better  -continue maxzide at full dose  -sodium is holding up--recheck monday 11. Wound care: allevyn dressing and pressure relief measures to sacrum  -can use allevyn dressing for hip    LOS (Days) 10 A FACE TO FACE EVALUATION WAS PERFORMED  SWARTZ,ZACHARY T 07/15/2012, 7:11 AM

## 2012-07-15 NOTE — Progress Notes (Signed)
Physical Therapy Note  Patient Details  Name: Madeline Serrano MRN: [id number] Date of Birth: 10/20/1929 Today's Date: 07/15/2012  Time:  1415-1500 Pain:  2/10 right groin Individual therapy  engageged in therapeutic mobility with emphasis on hip precautions, standing balance in functional activities, and endurance.  Pt. Stood for about 2 minutes before taking a sit down rest break.  Practiced using scale to ensure pt. Adhering to her precautions.  She was unsure of it so we confirmed.  Pt. Propelled wc on level surface with supervision level for set up and increased time.    Humberto Seals 07/15/2012, 3:02 PM

## 2012-07-16 ENCOUNTER — Inpatient Hospital Stay (HOSPITAL_COMMUNITY): Payer: Medicare Other | Admitting: Occupational Therapy

## 2012-07-16 LAB — PROTIME-INR
INR: 3.25 — ABNORMAL HIGH (ref 0.00–1.49)
Prothrombin Time: 31.4 seconds — ABNORMAL HIGH (ref 11.6–15.2)

## 2012-07-16 NOTE — Progress Notes (Signed)
Occupational Therapy Session Note  Patient Details  Name: Madeline Serrano MRN: [id number] Date of Birth: [birth year]  Today's Date: 07/16/2012 Time: 1400-1500 Time Calculation (min): 60 min  Skilled Therapeutic Interventions/Progress Updates:  Though patient complained of fatigue and sluggishness from pain meds taken last night and early morning for R "groin" spasms, she participated in therapy as she was able to tolerate.  She asked to stay w/c level as she dozed on and off during UE strenghtening exercises and endurance training to ease self care and functional mobility.       Therapy Documentation Precautions:  Precautions Precautions: Fall;Posterior Hip Restrictions Weight Bearing Restrictions: Yes RLE Weight Bearing: Partial weight bearing RLE Partial Weight Bearing Percentage or Pounds: 50% LLE Weight Bearing: Weight bearing as tolerated  Pain:denied during therapy   See FIM for current functional status  Therapy/Group: Individual Therapy  Bud Face [facility] 07/16/2012, 3:48 PM

## 2012-07-16 NOTE — Progress Notes (Signed)
ANTICOAGULATION CONSULT NOTE - F/U Consult  Pharmacy Consult:  Coumadin Indication: atrial fibrillation  Allergies  Allergen Reactions  . Sulfa Antibiotics Other (See Comments)    [name]  . Celebrex (Celecoxib) Swelling  . Clarithromycin Swelling  . Statins Other (See Comments)    Hair fell out  . Vioxx (Rofecoxib) Other (See Comments)    Renal problems  . Penicillins Rash    Patient Measurements: Weight:  82 kg  Vital Signs: Temp: 97.2 F (36.2 C) (10/13 0456) Temp src: Oral (10/13 0456) BP: 153/55 mmHg (10/13 0456) Pulse Rate: 66  (10/13 0456)  Labs:  Basename 07/16/12 0710 07/15/12 0646 07/14/12 0620  HGB -- -- --  HCT -- -- --  PLT -- -- --  APTT -- -- --  LABPROT 31.4* 28.9* 27.5*  INR 3.25* 2.91* 2.72*  HEPARINUNFRC -- -- --  CREATININE -- -- --  CKTOTAL -- -- --  CKMB -- -- --  TROPONINI -- -- --    The CrCl is unknown because both a height and weight (above a minimum accepted value) are required for this calculation.   Assessment: 44 YOF s/p right hip arthroplasty and also with atrial fibrillation on Coumadin. Home Coumadin dose is 2mg  STWTS and 4mg  MF. INR now supratherapeutic,   Anticoag: Coumadin for hx Afib and VTE px s/p right THA, INR 2.91, has been increasing in response to higher Coumadin doses than her home regimen.  Cards: Afib / HTN - BP elevated, HR stable - on Coreg, digoxin (level ok at 0.7 on 10/7), flecainide, Maxzide   Renal: SCr down 1.13, chronic hyponatremia, CL also low  Pulm: stable on RA, on Spiriva  Heme/Onc: hgb 9.2 - PO iron, plts WNL   Goal of Therapy:  INR 2-3 Monitor platelets by anticoagulation protocol: Yes  Plan:  - Hold Coumadin today - Continue daily INR, will plan to resume home regimen when INR mid-goal.  Thanks, Livy Ross K. Allena Katz, PharmD, BCPS.  Clinical Pharmacist Pager [phone]. 07/16/2012 8:08 AM

## 2012-07-16 NOTE — Progress Notes (Addendum)
Patient complains of severe spasms to RLE, offered tylenol and trazodone and patient agrees. After 20 min of administering medication, patient had minimal effectiveness. MD notified, orders received, patient agreed  And fell sleep shortly after one time dose of 1000 mg robaxin.

## 2012-07-16 NOTE — Progress Notes (Signed)
Subjective/Complaints:  Had more right thigh pain radiating down to the knee. Kept her up quite a bit last night. Feeling better this morning, but a little "dopy" from the extra med she had to take.  A 12 point review of systems has been performed and if not noted above is otherwise negative.   Objective: Vital Signs: Blood pressure 153/55, pulse 66, temperature 97.2 F (36.2 C), temperature source Oral, resp. rate 18, weight 82.9 kg (182 lb 12.2 oz), SpO2 96.00%. No results found. No results found for this basename: WBC:2,HGB:2,HCT:2,PLT:2 in the last 72 hours  Basename 07/13/12 0700  NA 130*  K 4.4  CL 94*  CO2 27  GLUCOSE 113*  BUN 12  CREATININE 1.13*  CALCIUM 9.7   CBG (last 3)  No results found for this basename: GLUCAP:3 in the last 72 hours  Wt Readings from Last 3 Encounters:  07/16/12 82.9 kg (182 lb 12.2 oz)  06/29/12 81.647 kg (180 lb)  06/29/12 81.647 kg (180 lb)    Physical Exam:  Constitutional: She is oriented to person, place, and time. She appears well-developed and well-nourished.  HENT:  Head: Normocephalic and atraumatic.  Eyes: Pupils are equal, round, and reactive to light.  Neck: Normal range of motion.  Cardiovascular: Tachycardia present. No murmur, regular Pulmonary/Chest: Effort normal and breath sounds normal. No wheezes, no distress  Abdominal: Normal appearance and bowel sounds are normal. She exhibits no distension.  Musculoskeletal: She exhibits edema (RLE at thigh/shin) is decreasing. Right 2nd and 3rd atrophied digits. Right leg limited in movement due to pain proximally. Positioning and ROM stable at right thigh. LLE intact for motor function. UE's grossly intact except for intrinsic weakness in the right hand Neurological: She is alert and oriented to person, place, and time.  Skin: Skin is warm and dry.  Surgical dressing right hip. Wound clean   Assessment/Plan: 1. Functional deficits secondary to right femoral neck fx which  require 3+ hours per day of interdisciplinary therapy in a comprehensive inpatient rehab setting. Physiatrist is providing close team supervision and 24 hour management of active medical problems listed below. Physiatrist and rehab team continue to assess barriers to discharge/monitor patient progress toward functional and medical goals.     FIM: FIM - Bathing Bathing Steps Patient Completed: Chest;Right Arm;Left Arm;Abdomen;Front perineal area;Buttocks;Right upper leg;Left upper leg Bathing: 4: Min-Patient completes 8-9 50f 10 parts or 75+ percent  FIM - Upper Body Dressing/Undressing Upper body dressing/undressing steps patient completed: Thread/unthread right sleeve of pullover shirt/dresss;Thread/unthread left sleeve of pullover shirt/dress;Put head through opening of pull over shirt/dress;Pull shirt over trunk Upper body dressing/undressing: 5: Set-up assist to: Obtain clothing/put away FIM - Lower Body Dressing/Undressing Lower body dressing/undressing steps patient completed: Thread/unthread right pants leg;Thread/unthread left pants leg;Pull pants up/down;Don/Doff right sock;Don/Doff left sock Lower body dressing/undressing: 4: Steadying Assist  FIM - Toileting Toileting steps completed by patient: Performs perineal hygiene;Adjust clothing prior to toileting Toileting Assistive Devices: Grab bar or rail for support Toileting: 3: Mod-Patient completed 2 of 3 steps  FIM - Diplomatic Services operational officer Devices: Bedside commode Toilet Transfers: 3-To toilet/BSC: Mod A (lift or lower assist);3-From toilet/BSC: Mod A (lift or lower assist)  FIM - Bed/Chair Transfer Bed/Chair Transfer Assistive Devices: Therapist, occupational: 5: Supine > Sit: Supervision (verbal cues/safety issues);5: Bed > Chair or W/C: Supervision (verbal cues/safety issues)  FIM - Locomotion: Wheelchair Locomotion: Wheelchair: 0: Activity did not occur FIM - Locomotion: Ambulation Locomotion:  Ambulation Assistive Devices: Designer, industrial/product Ambulation/Gait  Assistance: 5: Supervision Locomotion: Ambulation: 2: Travels 50 - 149 ft with supervision/safety issues  Comprehension Comprehension Mode: Auditory;Visual Comprehension: 5-Follows basic conversation/direction: With no assist  Expression Expression Mode: Verbal Expression: 5-Expresses basic needs/ideas: With no assist  Social Interaction Social Interaction: 6-Interacts appropriately with others with medication or extra time (anti-anxiety, antidepressant).  Problem Solving Problem Solving: 5-Solves basic problems: With no assist  Memory Memory: 5-Recognizes or recalls 90% of the time/requires cueing < 10% of the time  Medical Problem List and Plan:  1. DVT Prophylaxis/Anticoagulation: Pharmaceutical: Coumadin  2. Pain Management: control reasonable. Continue current meds  -if hip pain persists, will check an xray 3. Mood: motivated without signs of distress.  4. Neuropsych: This patient is capable of making decisions on his/her own behalf.  5. Atrial fibrillation with RVR: monitor HR with bid checks. Continue coreg, digoxin, and flecainide.  6. UTI: ucx negative 7. Urinary retention: showing some improvement 8. ABLA: continue iron supplement. Stable 9. Chronic hyponatremia?: sodium 130 10. Fluid overload: Improved post diuretics.weight trending down and she's feeling better  -continue maxzide at full dose  -sodium is holding up--recheck monday 11. Wound care: allevyn dressing and pressure relief measures to sacrum  -can use allevyn dressing for hip    LOS (Days) 11 A FACE TO FACE EVALUATION WAS PERFORMED  Jerris Fleer T 07/16/2012, 6:54 AM

## 2012-07-17 ENCOUNTER — Encounter (HOSPITAL_COMMUNITY): Payer: Medicare Other

## 2012-07-17 ENCOUNTER — Inpatient Hospital Stay (HOSPITAL_COMMUNITY): Payer: Medicare Other

## 2012-07-17 ENCOUNTER — Inpatient Hospital Stay (HOSPITAL_COMMUNITY): Payer: Medicare Other | Admitting: Physical Therapy

## 2012-07-17 LAB — PROTIME-INR
INR: 3.41 — ABNORMAL HIGH (ref 0.00–1.49)
Prothrombin Time: 32.5 seconds — ABNORMAL HIGH (ref 11.6–15.2)

## 2012-07-17 MED ORDER — ROSUVASTATIN CALCIUM 5 MG PO TABS
5.0000 mg | ORAL_TABLET | ORAL | Status: DC
  Filled 2012-07-17: qty 1

## 2012-07-17 MED ORDER — NON FORMULARY: 5.0000 mg | Status: DC

## 2012-07-17 MED ORDER — CONJ ESTROG-MEDROXYPROGEST ACE 0.3-1.5 MG PO TABS: 1.0000 | ORAL_TABLET | ORAL | Status: DC

## 2012-07-17 MED ORDER — TRIAMTERENE-HCTZ 37.5-25 MG PO TABS
1.0000 | ORAL_TABLET | Freq: Every day | ORAL | Status: DC
  Administered 2012-07-17: 1 via ORAL
  Filled 2012-07-17 (×2): qty 1

## 2012-07-17 NOTE — Progress Notes (Signed)
Physical Therapy Discharge Summary  Patient Details  Name: Madeline Serrano MRN: [id number] Date of Birth: 07/17/1930  Today's Date: 07/17/2012 Time: 7829-5621 Time Calculation (min): 45 min  Ambulation x 80', 30' with RW and supervision over controlled environment. Pt lightheaded, BP= 125/64, felt good enough to continue with no further issue. 50' ambulation in home environment over carpet, pt able to perform forwards/side stepping/ backwards walking with supervision. Bed mobility on actual bed performed with modified independence. Car transfer performed with supervision, maintained precautions well. Wheelchair mobility in home and controlled environment x 150' with modified independence secondary to increased time. Pt continues to require extended rest breaks.   Patient has met 8 of 10 long term goals due to improved activity tolerance, improved balance, decreased pain and ability to compensate for deficits.  Patient to discharge at an ambulatory level Supervision.   Patient's care partner unavailable to provide the necessary physical assistance at discharge.  Reasons goals not met: Pt has had slower than expected gains and was not able to meet modified independent goals. She still requires supervision with most mobility. Pt is aware that she is not ready to go home without supervision for several weeks and desires to continue her therapy at a SNF to promote optimal safety with eventual return home. Feel pt will make good gains.   Recommendation:  Patient will benefit from ongoing skilled PT services in skilled nursing facility setting to continue to advance safe functional mobility, address ongoing impairments in decreased balance with ambulation, decreased functional strength and endurance, impaired dynamic stability, need for assistance with mobility, and minimize fall risk.  Equipment: No equipment provided  Reasons for discharge: discharge from hospital  Patient/family agrees with progress  made and goals achieved: Yes  PT Discharge Precautions/Restrictions Precautions Precautions: Fall Restrictions Weight Bearing Restrictions: Yes RLE Weight Bearing: Partial weight bearing RLE Partial Weight Bearing Percentage or Pounds: 50% Vital Signs Therapy Vitals Pulse Rate: 63  BP: 125/64 mmHg (post ambulation) Patient Position, if appropriate: Sitting Pain Pain Assessment Pain Assessment: No/denies pain  Cognition Overall Cognitive Status: Appears within functional limits for tasks assessed Arousal/Alertness: Awake/alert Orientation Level: Oriented X4 Attention: Alternating Alternating Attention: Appears intact Memory: Appears intact Awareness: Appears intact Problem Solving: Appears intact Safety/Judgment: Appears intact Sensation Sensation Light Touch: Appears Intact (bil. LEs) Proprioception: Appears Intact (bil. LEs) Motor  Motor Motor: Within Functional Limits  Mobility Bed Mobility Supine to Sit: 6: Modified independent (Device/Increase time) Supine to Sit Details (indicate cue type and reason): Pt did not require cues for hip precautions Sit to Supine: 6: Modified independent (Device/Increase time) Sit to Supine - Details (indicate cue type and reason): Pt did not require cues for hip precautions Transfers Sit to Stand: 5: Supervision Stand to Sit: 5: Supervision Locomotion  Ambulation Ambulation: Yes Ambulation/Gait Assistance: 5: Supervision Ambulation Distance (Feet): 80 Feet Assistive device: Rolling walker Ambulation/Gait Assistance Details: Verbal cues for controlled breathing, encouragement for increased distance. Cues for weight bearing precautions Gait Gait: Yes Gait Pattern: Impaired Gait Pattern: Step-to pattern;Trunk flexed High Level Ambulation High Level Ambulation: Side stepping;Backwards walking Side Stepping: supervision Backwards Walking:  supervision Stairs / Additional Locomotion Stairs: Yes Stairs Assistance: 5:  Supervision Stair Management Technique: With walker Number of Stairs: 4  Wheelchair Mobility Wheelchair Mobility: Yes Wheelchair Assistance: 6: Modified independent (Device/Increase time) Occupational hygienist: Both upper extremities   Games developer Sitting - Level of Assistance: 6: Modified independent (Device/Increase time) Static Standing Balance Static Standing - Level of Assistance: 5:  Stand by assistance Dynamic Standing Balance Dynamic Standing - Balance Support: Bilateral upper extremity supported Dynamic Standing - Level of Assistance: 5: Stand by assistance Extremity Assessment      RLE Assessment RLE Assessment: Exceptions to [facility] RLE Strength RLE Overall Strength Comments: Generalized deconditioning, grossly >4-/5,  Rt. hip flexion which was not formally tested but continues to be functionally weak. Pt has improved functional strength of Rt. hip. LLE Assessment LLE Assessment: Exceptions to [facility] LLE Strength LLE Overall Strength Comments: Generalized deconditioning, grossly >4-5  See FIM for current functional status  [name] 07/17/2012, 4:17 PM

## 2012-07-17 NOTE — Progress Notes (Signed)
Discharge summary [id number]

## 2012-07-17 NOTE — Progress Notes (Signed)
Pt transferred to [facility] via transporters. Report called to facility prior to discharge.  All scheduled medication given.

## 2012-07-17 NOTE — Progress Notes (Signed)
ANTICOAGULATION CONSULT NOTE - F/U Consult  Pharmacy Consult:  Coumadin Indication: atrial fibrillation  Allergies  Allergen Reactions  . Sulfa Antibiotics Other (See Comments)    [name]  . Celebrex (Celecoxib) Swelling  . Clarithromycin Swelling  . Statins Other (See Comments)    Hair fell out  . Vioxx (Rofecoxib) Other (See Comments)    Renal problems  . Penicillins Rash    Patient Measurements: Weight:  82 kg  Vital Signs: Temp: 97.6 F (36.4 C) (10/14 0515) Temp src: Oral (10/14 0515) BP: 153/63 mmHg (10/14 0515) Pulse Rate: 64  (10/14 0814)  Labs:  Basename 07/17/12 0621 07/16/12 0710 07/15/12 0646  HGB -- -- --  HCT -- -- --  PLT -- -- --  APTT -- -- --  LABPROT 32.5* 31.4* 28.9*  INR 3.41* 3.25* 2.91*  HEPARINUNFRC -- -- --  CREATININE -- -- --  CKTOTAL -- -- --  CKMB -- -- --  TROPONINI -- -- --    The CrCl is unknown because both a height and weight (above a minimum accepted value) are required for this calculation.   Assessment: 74 YOF s/p right hip arthroplasty and also with atrial fibrillation on Coumadin. Home Coumadin dose is 2mg  STWTS and 4mg  MF. INR now continues to be supratherapeutic at 3.41. No bleeding noted, no recent CBC. Plans to discharge back to SNF later today  Goal of Therapy:  INR 2-3 Monitor platelets by anticoagulation protocol: Yes  Plan:  - Hold Coumadin today - Continue daily INR, will plan to resume home regimen when INR mid-goal.  Lysle Pearl, PharmD, BCPS Pager # [phone] 07/17/2012 11:27 AM

## 2012-07-17 NOTE — Progress Notes (Signed)
Physical Therapy Session Note  Patient Details  Name: Madeline Serrano MRN: [id number] Date of Birth: 10/28/1929  Today's Date: 07/17/2012 Time: 0830-0929 Time Calculation (min): 59 min  Skilled Therapeutic Interventions/Progress Updates:    Bathroom mobility and balance with self care, supervision. Gait x 60', 40' with RW close supervision, standing rest breaks needed. Steps x 4 reps (2 performed sequentially) min assist progressing to supervision. Cues for safety as pt negotiates RW with ascent in unsafe manner initially. Obstacle course negotiating chairs x 2 reps with close supervision over carpet for improved activity tolerance and balance. Bed mobility into bed supervision, cues for posterior hip precautions.   Therapy Documentation Precautions:  Precautions Precautions: Fall;Posterior Hip Restrictions Weight Bearing Restrictions: Yes RLE Weight Bearing: Partial weight bearing RLE Partial Weight Bearing Percentage or Pounds: 50% LLE Weight Bearing: Weight bearing as tolerated Pain: Pain Assessment Pain Assessment: 0-10 Pain Score:   3 Pain Type: Surgical pain Pain Location: Hip Pain Orientation: Right Pain Descriptors: Aching Pain Onset: On-going Pain Intervention(s): Repositioned;Other (Comment) (rest as needed)  See FIM for current functional status  Therapy/Group: Individual Therapy  [name] 07/17/2012, 12:08 PM

## 2012-07-17 NOTE — Progress Notes (Signed)
Social Work Patient ID: Madeline Serrano, female   DOB: 1930-05-18, 78 y.o.   MRN: [id number]  Received SNF offer today from [facility] - pt accepts and plans are to transfer today after lunch. MD and therapy team aware.    Jarryd Gratz

## 2012-07-17 NOTE — Progress Notes (Signed)
Occupational Therapy Discharge Summary  Patient Details  Name: Madeline Serrano MRN: [id number] Date of Birth: 04/05/1930  Today's Date: 07/17/2012  Patient has met 6 of 10 long term goals due to improved activity tolerance, improved balance and ability to compensate for deficits. Pt has made steady progress with bathing, dressing, toilet transfers, toileting, and shower transfers.  Pt continues to requires min A for LB bathing and supervision for toilet transfers/toileting.  Pt completed simple meal prep in kitchen at amb level and sitting on stool for prep. Patient to discharge at [facility] Assist level.  Pt plans to discharge to SNF for further rehab in order to reach modified independent level.  Reasons goals not met: She still needs min assist for LB bathing secondary to having increased difficulty with donning her shoes.  She needs supervision for toileting and dynamic standing balance for safety.  She is not yet modified independent with any of these tasks.  Feel she will benefit from continued rehab at SNF level to achieve these goals.  Recommendation:  Patient will benefit from ongoing skilled OT services in skilled nursing facility setting to continue to advance functional skills in the area of BADL.  Equipment: No equipment provided Pt to discharge to SNF  Reasons for discharge: discharge from hospital  Patient/family agrees with progress made and goals achieved: Yes  OT Discharge ADL ADL Equipment Provided: Reacher;Sock aid;Long-handled sponge Eating: Modified independent Where Assessed-Eating: Wheelchair Grooming: Modified independent Where Assessed-Grooming: Sitting at sink;Wheelchair Upper Body Bathing: Supervision/safety Where Assessed-Upper Body Bathing: Sitting at sink;Wheelchair Lower Body Bathing: Supervision/safety Where Assessed-Lower Body Bathing: Standing at sink;Sitting at sink;Wheelchair Upper Body Dressing: Modified independent (Device) Where Assessed-Upper  Body Dressing: Sitting at sink;Wheelchair Lower Body Dressing: Minimal assistance Where Assessed-Lower Body Dressing: Standing at sink;Sitting at sink;Wheelchair Toileting: Supervision/safety Where Assessed-Toileting: Teacher, adult education: Close supervision Toilet Transfer Method: Proofreader: Grab bars Vision/Perception  Vision - History Baseline Vision: Wears glasses all the time Patient Visual Report: No change from baseline Vision - Assessment Vision Assessment: Vision not tested Perception Perception: Within Functional Limits Praxis Praxis: Intact  Cognition Overall Cognitive Status: Appears within functional limits for tasks assessed Arousal/Alertness: Awake/alert Orientation Level: Oriented X4 Attention: Alternating Alternating Attention: Appears intact Memory: Appears intact Awareness: Appears intact Problem Solving: Appears intact Safety/Judgment: Appears intact Sensation Sensation Light Touch: Appears Intact Stereognosis: Appears Intact Hot/Cold: Appears Intact Proprioception: Appears Intact Additional Comments:  (Pt with 47 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 with re) Coordination Gross Motor Movements are Fluid and Coordinated: Yes Fine Motor Movements are Fluid and Coordinated: Yes Motor  Motor Motor: Within Functional Limits  Trunk/Postural Assessment  Cervical Assessment Cervical Assessment: Within Functional Limits Thoracic Assessment Thoracic Assessment: Within Functional Limits Lumbar Assessment Lumbar Assessment: Within Functional Limits Postural Control Postural Control: Within Functional Limits  Balance Static Sitting Balance Static Sitting - Level of Assistance: 7: Independent Extremity/Trunk Assessment RUE Assessment RUE Assessment: Exceptions to [facility] (Pt with full AROM in all joints except 2nd and 3rd digits se) LUE Assessment LUE Assessment: Exceptions to [facility] (Pt demonstrates shoulder internal rotation  limitations secon)  See FIM for current functional status  Rich Brave 07/17/2012, 11:40 AM  Perrin Maltese, OTR/L Pager number [phone] 07/17/2012

## 2012-07-17 NOTE — Progress Notes (Signed)
Occupational Therapy Session Note  Patient Details  Name: Madeline Serrano MRN: [id number] Date of Birth: October 24, 1929  Today's Date: 07/17/2012 Time: 0700-0755 Time Calculation (min): 55 min  Short Term Goals: Week 2:  OT Short Term Goal 1 (Week 2): STG=LTG secondary to ELOS  Skilled Therapeutic Interventions/Progress Updates:    Pt in bed upon arrival but agreeable to participating in bathing and dressing tasks at w/c level.  Pt amb with RW to gather clothing on the way to w/c at sink.  Pt purchased AE and uses reacher and sock aid to assist with LB dressing and long handle sponge to assist with BLE bathing.  Pt amb to bathroom for toileting and requires supervision with transfers.  Focus on activity tolerance, safety awareness, and standing balance.  Therapy Documentation Precautions:  Precautions Precautions: Fall;Posterior Hip Restrictions Weight Bearing Restrictions: Yes RLE Weight Bearing: Partial weight bearing RLE Partial Weight Bearing Percentage or Pounds: 50% LLE Weight Bearing: Weight bearing as tolerated   Pain: Pain Assessment Pain Assessment: No/denies pain  See FIM for current functional status  Therapy/Group: Individual Therapy  Rich Brave 07/17/2012, 7:57 AM

## 2012-07-17 NOTE — Discharge Summary (Signed)
**Note Madeline via Obfuscation** Serrano, Madeline NO.:  [id number]  MEDICAL RECORD NO.:  [id number]  LOCATION:  4033                         FACILITY:  [facility]  PHYSICIAN:  Ranelle Oyster, M.D.DATE OF BIRTH:  28-Feb-1930  DATE OF ADMISSION:  07/05/2012 DATE OF DISCHARGE:  07/17/2012                              DISCHARGE SUMMARY   DISCHARGE DIAGNOSES: 1. Right hip hemiarthroplasty. 2. Acute blood loss anemia. 3. Fluid overload, resolved. 4. Chronic hyponatremia, improved. 5. Atrial fibrillation.   HISTORY OF PRESENT ILLNESS:  Ms. Madeline Serrano. Looney is an 78 year old female with history of AFib with chronic Coumadin who fell on June 29, 2012, sustaining a nondisplaced right subcapital femoral neck fracture. Her Coumadin was reversed and she underwent right hip hemiarthroplasty by Dr. Charlann Boxer on June 30, 2012,  Postop is partial weightbearing on right lower extremity and has been transfused with 2 units of packed red blood cells for acute blood loss anemia.  She did have issues with urinary retention requiring Foley replacement.  She was noted to have a sodium of 123 at admission that is slowly improving.  Digoxin was added to help control tachycardia.  Therapies are ongoing and therapy team recommended CIR for progression.  PAST MEDICAL HISTORY:  AFib, hypertension, high cholesterol, macular degeneration.  FUNCTIONAL HISTORY:  The patient was independent but sedentary prior to admission.  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  FUNCTIONAL STATUS:  The patient was at min assist with upper extremity assist for sit-to-stand transfers, min assist for ambulating 45 feet with rolling walker with decreased step trend.  Noted to have difficulty bearing any weight through right lower extremity.  She required total assist for lower body bathing and dressing.  RECENT LABORATORY DATA:  Check of lytes from July 13, 2012 revealed sodium 130, potassium 4.4, chloride 94, CO2 of 27, BUN 12,  creatinine 1.13, glucose 113.  CBC of July 07, 2012, revealed hemoglobin 9.2, hematocrit 27.5, white count 7.7, platelets 297. Digoxin level at 0.7. INR today at 3.41.  HOSPITAL COURSE:  Ms. [name] was admitted to Rehab on July 05, 2012, for inpatient therapies to consist of PT, OT at least 3 hours 5 days a week.  Past admission, physiatrist, rehab, RN, and therapy team have worked together to provide customized collaborative interdisciplinary care.  Rehab RN has worked with the patient on bowel and bladder program as well as wound care monitoring.  The patient was noted to be fluid overloaded with weight at 96 kg.  She was treated with diuretics and her Maxzide was resumed.  Currently, the patient is back to her baseline weight of 82.9 kg.  Foley was discontinued and bladder training was initiated.  The patient was voiding without any signs of retention.  She has completed her antibiotic course with Cipro. Urine culture of July 06, 2012, showed no growth.  The patient's hip incision has been monitored along.  This is healing well without any signs or symptoms of infection. Her blood pressures have been checked on b.i.d. basis.  These have been reasonably controlled ranging from 110- 150 systolics.  Her tachycardia has resolved with heart rate in 60s range.  During the patient's  stay in rehab, weekly team conferences were held to monitor the patient's progress, set goals, as well as discuss barriers to discharge.  Physical Therapy has been ongoing with focus on standing balance, functional activities as well as endurance.  They have worked with the patient on compliance of weightbearing status as well as hip precautions.  Currently, the patient is able to ambulate up to 30-40 feet with rolling walker with close supervision.  She continues to require frequent restful breaks due to left lower extremity weakness and dyspnea on exertion.  OT has been working with the patient on  self-care tasks.  The patient is supervision for upper body bathing and dressing, min to steady assist for lower body bathing and dressing, supervision for toilet transfers, and steady assist for toileting.  She is utilizing Adaptic equipment appropriately to assist with lower body dressing. At the present, the patient continues to require assistance and has elected on further therapies at SNF.  Bed is available at [facility] for 07/17/12. The patient is discharged to this facility in improved condition.  DISCHARGE MEDICATIONS: 1. Calcium carbonate 500 mg p.o. t.i.d. 2. Coreg 25 mg p.o. b.i.d. 3. Digoxin 0.125 mg p.o. per day. 4. Colace 100 mg p.o. b.i.d. 5. Prempro 0.3-1.5 p.o. on Monday, Wednesday, Friday. 6. Ferrous sulfate 325 mg p.o. t.i.d. 7. Tambocor 50 mg p.o. b.i.d. 8. Vicodin 5-325, 1-2 p.o. q.4 h. p.r.n. pain. 9. Claritin 10 mg p.o. per day. 10.Robaxin 500 mg p.o. q.6 h. p.r.n. spasms. 11.Multivitamin 1 p.o. per day. 12.Protonix 40 mg p.o. per day. 13.Crestor 5 mg p.o. Monday, Wednesday, Friday. 14.Spiriva 18 mcg 1 inhalation daily. 15.Trazodone 50 mg p.o. at bedtime p.r.n. insomnia. 16.Maxzide 37.5-25 one p.o. per day. 17.Coumadin 2 mg on Sunday, Tuesday, Wednesday, Thursday and Saturday.  ACTIVITY LEVEL:  Requires 24-hour supervision. Partial weightbearing on right lower extremity. Maintain right total hip precautions.  SPECIAL INSTRUCTIONS:  Progressive PT, OT to continue past discharge. Recheck ProTime in a.m. and adjust Coumadin dose to keep INR within 2-3 range.  FOLLOWUP:  The patient to follow up with Dr. Durene Romans in 2 weeks for postop check.  Follow up with MD at [facility] for medical issues. Follow up with Dr. Riley Kill as needed.     Delle Reining, P.A.   ______________________________ Ranelle Oyster, M.D.    PL/MEDQ  D:  07/17/2012  T:  07/17/2012  Job:  [id number]  cc:   Madlyn Frankel Charlann Boxer, M.D. Kari Baars, M.D.

## 2012-07-17 NOTE — Progress Notes (Signed)
Subjective/Complaints:  Good morning. Slept well last night  A 12 point review of systems has been performed and if not noted above is otherwise negative.   Objective: Vital Signs: Blood pressure 153/63, pulse 65, temperature 97.6 F (36.4 C), temperature source Oral, resp. rate 18, weight 82.9 kg (182 lb 12.2 oz), SpO2 96.00%. No results found. No results found for this basename: WBC:2,HGB:2,HCT:2,PLT:2 in the last 72 hours No results found for this basename: NA:2,K:2,CL:2,CO2:2,GLUCOSE:2,BUN:2,CREATININE:2,CALCIUM:2 in the last 72 hours CBG (last 3)  No results found for this basename: GLUCAP:3 in the last 72 hours  Wt Readings from Last 3 Encounters:  07/16/12 82.9 kg (182 lb 12.2 oz)  06/29/12 81.647 kg (180 lb)  06/29/12 81.647 kg (180 lb)    Physical Exam:  Constitutional: She is oriented to person, place, and time. She appears well-developed and well-nourished.  HENT:  Head: Normocephalic and atraumatic.  Eyes: Pupils are equal, round, and reactive to light.  Neck: Normal range of motion.  Cardiovascular: Tachycardia present. No murmur, regular Pulmonary/Chest: Effort normal and breath sounds normal. No wheezes, no distress  Abdominal: Normal appearance and bowel sounds are normal. She exhibits no distension.  Musculoskeletal: She exhibits edema (RLE at thigh/shin) is decreasing. Right 2nd and 3rd atrophied digits. Right leg limited in movement due to pain proximally. Positioning and ROM stable at right thigh. LLE intact for motor function. UE's grossly intact except for intrinsic weakness in the right hand Neurological: She is alert and oriented to person, place, and time.  Skin: Skin is warm and dry.  Surgical dressing right hip. Wound clean   Assessment/Plan: 1. Functional deficits secondary to right femoral neck fx which require 3+ hours per day of interdisciplinary therapy in a comprehensive inpatient rehab setting. Physiatrist is providing close team supervision  and 24 hour management of active medical problems listed below. Physiatrist and rehab team continue to assess barriers to discharge/monitor patient progress toward functional and medical goals.     FIM: FIM - Bathing Bathing Steps Patient Completed: Chest;Right Arm;Left Arm;Abdomen;Front perineal area;Buttocks;Right upper leg;Left upper leg Bathing: 4: Min-Patient completes 8-9 61f 10 parts or 75+ percent  FIM - Upper Body Dressing/Undressing Upper body dressing/undressing steps patient completed: Thread/unthread right sleeve of pullover shirt/dresss;Thread/unthread left sleeve of pullover shirt/dress;Put head through opening of pull over shirt/dress;Pull shirt over trunk Upper body dressing/undressing: 5: Set-up assist to: Obtain clothing/put away FIM - Lower Body Dressing/Undressing Lower body dressing/undressing steps patient completed: Thread/unthread right pants leg;Thread/unthread left pants leg;Pull pants up/down;Don/Doff right sock;Don/Doff left sock Lower body dressing/undressing: 4: Steadying Assist  FIM - Toileting Toileting steps completed by patient: Adjust clothing prior to toileting;Performs perineal hygiene Toileting Assistive Devices: Grab bar or rail for support Toileting: 3: Mod-Patient completed 2 of 3 steps  FIM - Diplomatic Services operational officer Devices: Psychiatrist Transfers: 3-To toilet/BSC: Mod A (lift or lower assist);3-From toilet/BSC: Mod A (lift or lower assist)  FIM - Bed/Chair Transfer Bed/Chair Transfer Assistive Devices: Therapist, occupational: 4: Bed > Chair or W/C: Min A (steadying Pt. > 75%);4: Chair or W/C > Bed: Min A (steadying Pt. > 75%)  FIM - Locomotion: Wheelchair Locomotion: Wheelchair: 0: Activity did not occur FIM - Locomotion: Ambulation Locomotion: Ambulation Assistive Devices: Designer, industrial/product Ambulation/Gait Assistance: 5: Supervision Locomotion: Ambulation: 2: Travels 50 - 149 ft with supervision/safety  issues  Comprehension Comprehension Mode: Auditory Comprehension: 5-Follows basic conversation/direction: With no assist  Expression Expression Mode: Verbal Expression: 5-Expresses basic needs/ideas: With extra time/assistive device  Social Interaction Social Interaction: 6-Interacts appropriately with others with medication or extra time (anti-anxiety, antidepressant).  Problem Solving Problem Solving: 5-Solves basic problems: With no assist  Memory Memory: 5-Recognizes or recalls 90% of the time/requires cueing < 10% of the time  Medical Problem List and Plan:  1. DVT Prophylaxis/Anticoagulation: Pharmaceutical: Coumadin  2. Pain Management: control reasonable. Continue current meds  -hip pain a little better 3. Mood: motivated without signs of distress.  4. Neuropsych: This patient is capable of making decisions on his/her own behalf.  5. Atrial fibrillation with RVR: monitor HR with bid checks. Continue coreg, digoxin, and flecainide.  6. UTI: ucx negative 7. Urinary retention: showing some improvement 8. ABLA: continue iron supplement. Stable 9. Chronic hyponatremia?: sodium 130 10. Fluid overload: Improved post diuretics.weight trending down and she's feeling better  -decreased maxzide to 1/2 dose  -sodium is holding up--recheck today 11. Wound care: allevyn dressing and pressure relief measures to sacrum  -can use allevyn dressing for hip    LOS (Days) 12 A FACE TO FACE EVALUATION WAS PERFORMED  Adelyne Marchese T 07/17/2012, 7:43 AM

## 2012-07-18 NOTE — Progress Notes (Signed)
Social Work  Discharge Note  The overall goal for the admission was met for:   Discharge location: No - plan changed to SNF as pt does not have 24/7 assistance as recommended  Length of Stay: Yes  Discharge activity level: Yes - supervision to minimal assistance  Home/community participation: Yes  Services provided included: MD, RD, PT, OT, RN, TR, Pharmacy and SW  Financial Services: Medicare and Private Insurance: BCBS  Follow-up services arranged: Other: SNF at [facility]  Comments (or additional information):  Patient/Family verbalized understanding of follow-up arrangements: Yes  Individual responsible for coordination of the follow-up plan: patient  Confirmed correct DME delivered: NA  Madeline Serrano

## 2012-08-04 DIAGNOSIS — I350 Nonrheumatic aortic (valve) stenosis: Secondary | ICD-10-CM

## 2012-08-04 HISTORY — DX: Nonrheumatic aortic (valve) stenosis: I35.0

## 2012-08-24 ENCOUNTER — Encounter (HOSPITAL_COMMUNITY): Payer: Self-pay | Admitting: Emergency Medicine

## 2012-08-24 ENCOUNTER — Observation Stay (HOSPITAL_COMMUNITY)
Admission: EM | Admit: 2012-08-24 | Discharge: 2012-09-01 | Disposition: A | Discharge status: Home / Self Care | Payer: Medicare Other | Attending: Internal Medicine | Admitting: Internal Medicine

## 2012-08-24 ENCOUNTER — Emergency Department (HOSPITAL_COMMUNITY): Payer: Medicare Other

## 2012-08-24 DIAGNOSIS — Z7901 Long term (current) use of anticoagulants: Secondary | ICD-10-CM | POA: Insufficient documentation

## 2012-08-24 DIAGNOSIS — I951 Orthostatic hypotension: Secondary | ICD-10-CM | POA: Insufficient documentation

## 2012-08-24 DIAGNOSIS — S72001A Fracture of unspecified part of neck of right femur, initial encounter for closed fracture: Secondary | ICD-10-CM | POA: Diagnosis present

## 2012-08-24 DIAGNOSIS — S72009A Fracture of unspecified part of neck of unspecified femur, initial encounter for closed fracture: Secondary | ICD-10-CM | POA: Insufficient documentation

## 2012-08-24 DIAGNOSIS — X58XXXA Exposure to other specified factors, initial encounter: Secondary | ICD-10-CM | POA: Insufficient documentation

## 2012-08-24 DIAGNOSIS — R4701 Aphasia: Secondary | ICD-10-CM

## 2012-08-24 DIAGNOSIS — J4489 Other specified chronic obstructive pulmonary disease: Secondary | ICD-10-CM | POA: Insufficient documentation

## 2012-08-24 DIAGNOSIS — M810 Age-related osteoporosis without current pathological fracture: Secondary | ICD-10-CM | POA: Insufficient documentation

## 2012-08-24 DIAGNOSIS — I4891 Unspecified atrial fibrillation: Secondary | ICD-10-CM | POA: Insufficient documentation

## 2012-08-24 DIAGNOSIS — E7800 Pure hypercholesterolemia, unspecified: Secondary | ICD-10-CM | POA: Insufficient documentation

## 2012-08-24 DIAGNOSIS — Z96649 Presence of unspecified artificial hip joint: Secondary | ICD-10-CM | POA: Insufficient documentation

## 2012-08-24 DIAGNOSIS — E860 Dehydration: Secondary | ICD-10-CM | POA: Insufficient documentation

## 2012-08-24 DIAGNOSIS — G459 Transient cerebral ischemic attack, unspecified: Principal | ICD-10-CM | POA: Insufficient documentation

## 2012-08-24 DIAGNOSIS — I10 Essential (primary) hypertension: Secondary | ICD-10-CM | POA: Insufficient documentation

## 2012-08-24 DIAGNOSIS — Z7902 Long term (current) use of antithrombotics/antiplatelets: Secondary | ICD-10-CM | POA: Insufficient documentation

## 2012-08-24 DIAGNOSIS — J449 Chronic obstructive pulmonary disease, unspecified: Secondary | ICD-10-CM | POA: Diagnosis present

## 2012-08-24 DIAGNOSIS — Z79899 Other long term (current) drug therapy: Secondary | ICD-10-CM | POA: Insufficient documentation

## 2012-08-24 LAB — CBC WITH DIFFERENTIAL/PLATELET
Eosinophils Relative: 5 % (ref 0–5)
HCT: 34.9 % — ABNORMAL LOW (ref 36.0–46.0)
Hemoglobin: 11.7 g/dL — ABNORMAL LOW (ref 12.0–15.0)
Lymphocytes Relative: 16 % (ref 12–46)
MCV: 91.1 fL (ref 78.0–100.0)
Monocytes Absolute: 0.8 10*3/uL (ref 0.1–1.0)
Monocytes Relative: 10 % (ref 3–12)
Neutro Abs: 5.5 10*3/uL (ref 1.7–7.7)
WBC: 8 10*3/uL (ref 4.0–10.5)

## 2012-08-24 LAB — URINALYSIS, ROUTINE W REFLEX MICROSCOPIC
Bilirubin Urine: NEGATIVE
Ketones, ur: NEGATIVE mg/dL
Nitrite: NEGATIVE
Protein, ur: NEGATIVE mg/dL
Urobilinogen, UA: 0.2 mg/dL (ref 0.0–1.0)
pH: 7 (ref 5.0–8.0)

## 2012-08-24 LAB — COMPREHENSIVE METABOLIC PANEL
BUN: 13 mg/dL (ref 6–23)
CO2: 25 mEq/L (ref 19–32)
Calcium: 9.7 mg/dL (ref 8.4–10.5)
Chloride: 93 mEq/L — ABNORMAL LOW (ref 96–112)
Creatinine, Ser: 1.04 mg/dL (ref 0.50–1.10)
GFR calc Af Amer: 57 mL/min — ABNORMAL LOW (ref >90)
GFR calc non Af Amer: 49 mL/min — ABNORMAL LOW (ref >90)
Glucose, Bld: 117 mg/dL — ABNORMAL HIGH (ref 70–99)
Total Bilirubin: 0.5 mg/dL (ref 0.3–1.2)

## 2012-08-24 LAB — PROTIME-INR
INR: 1.57 — ABNORMAL HIGH (ref 0.00–1.49)
Prothrombin Time: 18.3 seconds — ABNORMAL HIGH (ref 11.6–15.2)

## 2012-08-24 LAB — URINE MICROSCOPIC-ADD ON

## 2012-08-24 MED ORDER — DIGOXIN 125 MCG PO TABS
0.1250 mg | ORAL_TABLET | Freq: Every day | ORAL | Status: DC
  Administered 2012-08-25 – 2012-09-01 (×8): 0.125 mg via ORAL
  Filled 2012-08-24 (×9): qty 1

## 2012-08-24 MED ORDER — GADOBENATE DIMEGLUMINE 529 MG/ML IV SOLN
17.0000 mL | Freq: Once | INTRAVENOUS | Status: AC
  Administered 2012-08-24: 17 mL via INTRAVENOUS

## 2012-08-24 MED ORDER — TRIAMTERENE-HCTZ 37.5-25 MG PO TABS
1.0000 | ORAL_TABLET | Freq: Every day | ORAL | Status: DC
  Administered 2012-08-25 – 2012-08-28 (×4): 1 via ORAL
  Filled 2012-08-24 (×5): qty 1

## 2012-08-24 MED ORDER — HEPARIN (PORCINE) IN NACL 100-0.45 UNIT/ML-% IJ SOLN
1300.0000 [IU]/h | INTRAMUSCULAR | Status: DC
  Administered 2012-08-24: 1050 [IU]/h via INTRAVENOUS
  Administered 2012-08-25 (×2): 1300 [IU]/h via INTRAVENOUS
  Filled 2012-08-24 (×7): qty 250

## 2012-08-24 MED ORDER — ONDANSETRON HCL 8 MG PO TABS
4.0000 mg | ORAL_TABLET | Freq: Four times a day (QID) | ORAL | Status: DC | PRN
  Filled 2012-08-24: qty 0.5

## 2012-08-24 MED ORDER — DOCUSATE SODIUM 100 MG PO CAPS
100.0000 mg | ORAL_CAPSULE | Freq: Two times a day (BID) | ORAL | Status: DC
  Administered 2012-08-24 – 2012-09-01 (×16): 100 mg via ORAL
  Filled 2012-08-24 (×16): qty 1

## 2012-08-24 MED ORDER — TRAZODONE HCL 50 MG PO TABS
25.0000 mg | ORAL_TABLET | Freq: Every evening | ORAL | Status: DC | PRN
  Filled 2012-08-24: qty 0.5

## 2012-08-24 MED ORDER — WARFARIN SODIUM 3 MG PO TABS
3.0000 mg | ORAL_TABLET | Freq: Once | ORAL | Status: AC
  Administered 2012-08-24: 3 mg via ORAL
  Filled 2012-08-24: qty 1

## 2012-08-24 MED ORDER — WARFARIN - PHARMACIST DOSING INPATIENT
Freq: Every day | Status: DC
  Administered 2012-08-25: 17:00:00

## 2012-08-24 MED ORDER — SODIUM CHLORIDE 0.9 % IV BOLUS (SEPSIS)
1000.0000 mL | INTRAVENOUS | Status: AC
  Administered 2012-08-24: 1000 mL via INTRAVENOUS

## 2012-08-24 MED ORDER — TRAZODONE HCL 50 MG PO TABS: 50.0000 mg | ORAL_TABLET | Freq: Every evening | ORAL | Status: DC | PRN

## 2012-08-24 MED ORDER — FLECAINIDE ACETATE 50 MG PO TABS
50.0000 mg | ORAL_TABLET | Freq: Two times a day (BID) | ORAL | Status: DC
  Administered 2012-08-24 – 2012-09-01 (×16): 50 mg via ORAL
  Filled 2012-08-24 (×19): qty 1

## 2012-08-24 MED ORDER — METHOCARBAMOL 500 MG PO TABS
500.0000 mg | ORAL_TABLET | Freq: Four times a day (QID) | ORAL | Status: DC | PRN
  Administered 2012-08-28: 500 mg via ORAL
  Filled 2012-08-24: qty 1

## 2012-08-24 MED ORDER — SODIUM CHLORIDE 0.9 % IV SOLN: 250.0000 mL | INTRAVENOUS | Status: DC | PRN

## 2012-08-24 MED ORDER — ONDANSETRON HCL 4 MG/2ML IJ SOLN
4.0000 mg | Freq: Four times a day (QID) | INTRAMUSCULAR | Status: DC | PRN
  Administered 2012-08-30: 4 mg via INTRAVENOUS
  Filled 2012-08-24: qty 2

## 2012-08-24 MED ORDER — TIOTROPIUM BROMIDE MONOHYDRATE 18 MCG IN CAPS
18.0000 ug | ORAL_CAPSULE | Freq: Every day | RESPIRATORY_TRACT | Status: DC
  Administered 2012-08-25 – 2012-08-31 (×7): 18 ug via RESPIRATORY_TRACT
  Filled 2012-08-24 (×3): qty 5

## 2012-08-24 MED ORDER — ASPIRIN 81 MG PO CHEW
81.0000 mg | CHEWABLE_TABLET | Freq: Every day | ORAL | Status: DC
  Administered 2012-08-25 – 2012-09-01 (×8): 81 mg via ORAL
  Filled 2012-08-24 (×9): qty 1

## 2012-08-24 MED ORDER — SODIUM CHLORIDE 0.9 % IV BOLUS (SEPSIS)
500.0000 mL | Freq: Once | INTRAVENOUS | Status: AC
  Administered 2012-08-24: 500 mL via INTRAVENOUS

## 2012-08-24 MED ORDER — HYDROCODONE-ACETAMINOPHEN 5-325 MG PO TABS
1.0000 | ORAL_TABLET | ORAL | Status: DC | PRN
  Administered 2012-08-25 – 2012-08-30 (×3): 2 via ORAL
  Filled 2012-08-24: qty 2
  Filled 2012-08-24: qty 1
  Filled 2012-08-24 (×3): qty 2

## 2012-08-24 MED ORDER — POLYETHYLENE GLYCOL 3350 17 G PO PACK
17.0000 g | PACK | Freq: Every day | ORAL | Status: DC | PRN
  Administered 2012-08-31: 17 g via ORAL
  Filled 2012-08-24: qty 1

## 2012-08-24 MED ORDER — CARVEDILOL 25 MG PO TABS
25.0000 mg | ORAL_TABLET | Freq: Two times a day (BID) | ORAL | Status: DC
  Administered 2012-08-25 – 2012-08-31 (×13): 25 mg via ORAL
  Filled 2012-08-24 (×18): qty 1

## 2012-08-24 NOTE — ED Notes (Signed)
Report given to [place], [place] in CDU; pt transported to MRI and will be returned to CDU5

## 2012-08-24 NOTE — ED Notes (Signed)
Pt alert and oriented x4 and mentating appropriately. Pts son states this is not her normal however.

## 2012-08-24 NOTE — ED Notes (Signed)
Pixie Casino, PA at bedside.

## 2012-08-24 NOTE — H&P (Signed)
Physician Admission History and Physical     PCP:   No primary provider on file.   Chief Complaint:  TIA r/o    HPI: Madeline Serrano is an 78 y.o. female. Patient presents from [facility], where she was scheduled to be discharged on Sunday per her report. She is s/p R hip hemiarthroplasty for R subcapital femoral neck fracture in October. She also has a h/o COPD, a-fib, HTN, HLD. Patient went to exercise regimen and had a diffuse headache.  A/w word finding difficulties. Physician assistant at rehab facility gave her an ASA 325 and told her to come to ER to rule out stroke. Denies fevers, chills, vomiting, diarrhea, abdominal pain, neck stiffness, blurry vision but reports some photophobia.   CT head and MRI brain were unremarkable. She will be admitted for TIA r/o. Remarkable labs are low INR, low sodium (stable from 1 month prior), low Hgb (improved from 1 month prior), and impaired GFR (stable).   Review of Systems:  Neg except as noted in HPI   Past Medical History:  R hip hemiarthroplasty for R subcapital femoral neck fracture  HTN (renal artery study normal 3/09) history of Acute renal failure Anemia paroxysmal Atrial fib. on anticoagulation Hyperlipidemia IFG Osteoporosis 7/10 (t-2.6 Left femoral neck)- left foot stress fracture (11/12)  Surgical History: 1984 bladder prolapse 1999 knee arthroscopic 2001 knee arth. 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 No Alcohol. Medications: Prior to Admission medications   Medication Sig Start Date End Date Taking? Authorizing Provider  calcitonin, salmon,  (MIACALCIN/FORTICAL) 200 UNIT/ACT nasal spray Place 1 spray into the nose daily. Alternate nostrils   Yes Historical Provider, MD  calcium-vitamin D (OSCAL WITH D) 500-200 MG-UNIT per tablet Take 1 tablet by mouth 3 (three) times daily.   Yes Historical Provider, MD  carvedilol (COREG) 25 MG tablet Take 25 mg by mouth 2 (two) times daily with a meal.   Yes Historical Provider, MD  digoxin (LANOXIN) 0.125 MG tablet Take 0.125 mg by mouth daily.   Yes Historical Provider, MD  docusate sodium (COLACE) 100 MG capsule Take 100 mg by mouth 2 (two) times daily.   Yes Historical Provider, MD  estrogen, conjugated,-medroxyprogesterone (PREMPRO) 0.3-1.5 MG per tablet Take 1 tablet by mouth every Monday, Wednesday, and Friday.    Yes Historical Provider, MD  flecainide (TAMBOCOR) 50 MG tablet Take 50 mg by mouth 2 (two) times daily.     Yes Historical Provider, MD  HYDROcodone-acetaminophen (NORCO/VICODIN) 5-325 MG per tablet Take 1-2 tablets by mouth every 4 (four) hours as needed. For pain. 1 tablet for moderate pain and 2 tablets for severe pain   Yes Historical Provider, MD  lansoprazole (PREVACID) 30 MG capsule Take 30 mg by mouth daily.   Yes Historical Provider, MD  methocarbamol (ROBAXIN) 500 MG tablet Take 500 mg by mouth every 6 (six) hours as needed. For muscle spasms   Yes Historical Provider, MD  Multiple Vitamin (MULTIVITAMIN WITH MINERALS) TABS Take 1 tablet by mouth daily.   Yes Historical Provider, MD  Multiple Vitamins-Minerals (ICAPS) TABS Take 2 tablets by mouth 2 (two) times daily.   Yes Historical Provider, MD  rosuvastatin (CRESTOR)  10 MG tablet Take 5 mg by mouth every Monday, Wednesday, and Friday.    Yes Historical Provider, MD  tiotropium (SPIRIVA) 18 MCG inhalation capsule Place 18 mcg into inhaler and inhale daily.   Yes Historical Provider, MD  traZODone (DESYREL) 50 MG tablet Take 50 mg by mouth at bedtime as needed. For insomnia   Yes Historical Provider, MD  warfarin (COUMADIN) 2  MG tablet Take 2 mg by mouth See admin instructions. Tue, wed, thur, sat   Yes Historical Provider, MD    Allergies:   Allergies  Allergen Reactions  . Sulfa Antibiotics Other (See Comments)    [name]  . Celebrex (Celecoxib) Swelling  . Clarithromycin Swelling  . Statins Other (See Comments)    Hair fell out  . Vioxx (Rofecoxib) Other (See Comments)    Renal problems  . Penicillins Rash    Social History:  reports that she has never smoked. She does not have any smokeless tobacco history on file. She reports that she does not drink alcohol or use illicit drugs.  Family History: History reviewed. No pertinent family history.  Physical Exam: Filed Vitals:   08/24/12 1319 08/24/12 1331 08/24/12 1332 08/24/12 1454  BP:  198/69 208/62 190/71  Pulse:  71 71 75  Temp: 98.5 F (36.9 C)     TempSrc:      Resp:    16  SpO2:    100%   General appearance: WF in NAD  Head: Normocephalic, without obvious abnormality, atraumatic Eyes: conjunctivae/corneas clear. PERRL, EOM's intact.  Nose: Nares normal. Septum midline. Mucosa normal. No drainage or sinus tenderness. Throat: lips, mucosa, and tongue normal; teeth and gums normal; dry MM  Neck: no adenopathy, no carotid bruit, no JVD and thyroid not enlarged, symmetric, no tenderness/mass/nodules Resp: CTAB, no wheezes or rales  Cardio: RRR, 2/6 SEM, 1+ carotid bruit B/L GI: soft, non-tender; bowel sounds normal; no masses,  no organomegaly Extremities: extremities normal, atraumatic, no cyanosis or edema; 4/5 strength on R, 5/5 strength on L  Pulses: 2+ and symmetric Lymph nodes: Cervical adenopathy: no cervical lymphadenopathy Neurologic: Alert and oriented X 3, normal strength and tone. Normal symmetric reflexes.     Labs on Admission:   [facility] 08/24/12 1249  NA 130*  K 4.0  CL 93*  CO2 25  GLUCOSE 117*  BUN 13  CREATININE 1.04  CALCIUM 9.7  MG --  PHOS --    Basename 08/24/12 1249  AST 39*  ALT 25    ALKPHOS 109  BILITOT 0.5  PROT 7.0  ALBUMIN 3.5   No results found for this basename: LIPASE:2,AMYLASE:2 in the last 72 hours  Basename 08/24/12 1249  WBC 8.0  NEUTROABS 5.5  HGB 11.7*  HCT 34.9*  MCV 91.1  PLT 222   No results found for this basename: CKTOTAL:3,CKMB:3,CKMBINDEX:3,TROPONINI:3 in the last 72 hours Lab Results  Component Value Date   INR 1.57* 08/24/2012   INR 3.41* 07/17/2012   INR 3.25* 07/16/2012   No results found for this basename: TSH,T4TOTAL,FREET3,T3FREE,THYROIDAB in the last 72 hours No results found for this basename: VITAMINB12:2,FOLATE:2,FERRITIN:2,TIBC:2,IRON:2,RETICCTPCT:2 in the last 72 hours  Radiological Exams on Admission: Ct Head Wo Contrast  08/24/2012  *RADIOLOGY REPORT*  Clinical Data: 78 year old female with headache, nausea, dizziness.  CT HEAD WITHOUT CONTRAST  Technique:  Contiguous axial images were obtained from the base of the skull through the vertex without contrast.  Comparison: None.  Findings: Mucoperiosteal thickening of the left maxillary sinus and to a  lesser extent both sphenoid sinuses.  Mild bubbly opacity in the right sphenoid.  Other Visualized paranasal sinuses and mastoids are clear.  No acute osseous abnormality identified.  No acute orbit soft tissue findings.  Negative scalp soft tissues.  Cerebral volume is within normal limits for age.  No midline shift, ventriculomegaly, mass effect, evidence of mass lesion, intracranial hemorrhage or evidence of cortically based acute infarction.  Gray-white matter differentiation is within normal limits throughout the brain.  No suspicious intracranial vascular hyperdensity.  IMPRESSION: 1. Normal noncontrast CT appearance of the brain for age. 2.  Chronic paranasal sinusitis.   Original Report Authenticated By: Erskine Speed, M.D.    Orders placed during the hospital encounter of 06/29/12  . EKG 12-LEAD  . EKG 12-LEAD  . EKG 12-LEAD  . EKG 12-LEAD  . EKG    Assessment/Plan TIA  w/u   - CT neg in ED   - MRI being performed in ED, no CVA findings   - given pt had A fib and was subtherapeutic on admission, some concern may be cardioembolic CVA. For this reason will bridge coumadin until INR 2-3 w/ hep ggt while continuing coumadin  - perform TTE w/ bubble to look for PFO or mural thrombus   - perform carotid dopplars   - place on ASA 81 dialy while working up TIA    R hip hemiarthroplasty for R subcapital femoral neck fracture    - cont home pain medications and consult PT/OT   HTN (renal artery study normal 3/09)  - cont home meds   history of Acute renal failure  - GFR is stable from October. Will monitor daily   Anemia  - stable from last admission in oct. Will monitor daily   paroxysmal Atrial fib. on anticoagulation   - hep bridge to therapeutic coumadin   - hep ggt   - coumadin per pharmacy. INR goal 2-3   Hyperlipidemia   - pt was on low dose crestor on QOD basis, I am assuming from intolerance.  - Unsure if pt was tolerant to other statins and crestor is not on formulary, so will hold on statin overnight and let PCP decide on statin tomorrow   FEN   - cardiac diet   - SLIV    PPx   - PPI   - hep ggt and coumadin   Dispo    - admit to obs while doing TIA w/u . Coumadin preferably needs to become therapeutic prior to d/c given some concern this may be cardioembolic. But will defer this decision to PCP  Brennen Camper 08/24/2012, 3:13 PM

## 2012-08-24 NOTE — ED Notes (Signed)
Attempted to call report - advised to CB in 10-15 min.

## 2012-08-24 NOTE — ED Notes (Signed)
Pt denies pain; Pt denies nausea; Pt denies dizziness but states feeling a little lightheaded. Pt denies blurred vision but states light "seems brighter." Pt mentating appropriately.

## 2012-08-24 NOTE — ED Notes (Signed)
Pt states she has come here because the rehab facility she was at thought she was having a stroke. Facility told EMS she had dysphasia but upon arrival was not apparent to EMS. Pt remembers events and is mentating and speaking appropriately. Pt has had intermittent headaches and nausea on and off since Tuesday according to patient.

## 2012-08-24 NOTE — ED Provider Notes (Signed)
History     CSN: [id number]  Arrival date & time 08/24/12  1145   First MD Initiated Contact with Patient 08/24/12 1201      Chief Complaint  Patient presents with  . Headache  . Nausea  . Dizziness    (Consider location/radiation/quality/duration/timing/severity/associated sxs/prior treatment) HPI Comments: Patient with Hx of COPD, a-fib, HTN, HLD, presents from [facility] s/p hip fracture and surgical intervention last month. Patient went to exercise regimen and had a diffuse headache rated 11/10. Associated dizziness and nausea. Patient reports having her first migraine on Tues. Per family member patient had word difficulties and facial droop. Physician assistant at rehab facility gave her an ASA and told her to come to ER to rule out stroke. Denies fevers or chills. Denies vomiting, diarrhea, or abdominal pain. Denies neck stiffness. Denies blurry vision but reports some photophobia. Patient is followed [facility] and Dr[facility].  The history is provided by the patient. No language interpreter was used.    Past Medical History  Diagnosis Date  . A-fib   . Hypertension   . High cholesterol   . Macular degeneration     Past Surgical History  Procedure Date  . Hip arthroplasty 06/30/2012    Procedure: ARTHROPLASTY BIPOLAR HIP;  Surgeon: Shelda Pal, MD;  Location: [facility] OR;  Service: Orthopedics;  Laterality: Right;  right hip hemiarthroplasty    No family history on file.  History  Substance Use Topics  . Smoking status: Never Smoker   . Smokeless tobacco: Not on file  . Alcohol Use: No    OB History    Grav Para Term Preterm Abortions TAB SAB Ect Mult Living                  Review of Systems  Constitutional: Negative for fever and chills.  Eyes: Positive for photophobia. Negative for visual disturbance.  Gastrointestinal: Positive for nausea. Negative for vomiting, abdominal pain and diarrhea.  Neurological:  Positive for dizziness and headaches.  All other systems reviewed and are negative.    Allergies  Sulfa antibiotics; Celebrex; Clarithromycin; Statins; Vioxx; and Penicillins  Home Medications   Current Outpatient Rx  Name  Route  Sig  Dispense  Refill  . CALCIUM CARBONATE 600 MG PO TABS   Oral   Take 600 mg by mouth 3 (three) times daily with meals.         Marland Kitchen CARVEDILOL 12.5 MG PO TABS   Oral   Take 18.75 mg by mouth 2 (two) times daily with a meal.          . CONJ ESTROG-MEDROXYPROGEST ACE 0.3-1.5 MG PO TABS   Oral   Take 1 tablet by mouth daily. 5 days a week Does not take on Tuesday and Friday         . FEXOFENADINE HCL 180 MG PO TABS   Oral   Take 180 mg by mouth daily as needed. For allergies          . OMEGA-3 FATTY ACIDS 1000 MG PO CAPS   Oral   Take 1 g by mouth daily.          Marland Kitchen FLECAINIDE ACETATE 50 MG PO TABS   Oral   Take 50 mg by mouth 2 (two) times daily.           Marland Kitchen LANSOPRAZOLE 15 MG PO CPDR   Oral   Take 15 mg by mouth every other day.         Marland Kitchen  ICAPS AREDS FORMULA PO TABS   Oral   Take 1 tablet by mouth daily.         Marland Kitchen OLMESARTAN MEDOXOMIL 20 MG PO TABS   Oral   Take 20 mg by mouth daily.         Marland Kitchen PYRIDOXINE HCL 200 MG PO TABS   Oral   Take 200 mg by mouth 2 (two) times daily.          Marland Kitchen ROSUVASTATIN CALCIUM 10 MG PO TABS   Oral   Take 5 mg by mouth every Monday, Wednesday, and Friday.          Marland Kitchen TIOTROPIUM BROMIDE MONOHYDRATE 18 MCG IN CAPS   Inhalation   Place 18 mcg into inhaler and inhale daily.         . TRIAMTERENE-HCTZ 75-50 MG PO TABS   Oral   Take 0.5 tablets by mouth daily.         . WARFARIN SODIUM 4 MG PO TABS   Oral   Take 2-4 mg by mouth daily. Takes 1 tablet on Monday, and Friday Takes 1/2 tablet the rest of the week           BP 188/73  Pulse 69  Temp 97.4 F (36.3 C) (Oral)  Resp 13  SpO2 100%  Physical Exam  Nursing note and vitals reviewed. Constitutional: She is  oriented to person, place, and time. She appears well-developed and well-nourished.  HENT:  Head: Normocephalic and atraumatic.  Mouth/Throat: Oropharynx is clear and moist.  Eyes: Conjunctivae normal and EOM are normal. Pupils are equal, round, and reactive to light. No scleral icterus.  Neck: Normal range of motion. Neck supple.  Cardiovascular: Normal rate, regular rhythm and normal heart sounds.        No carotid bruits on exam.  Pulmonary/Chest: Effort normal and breath sounds normal.  Abdominal: Soft. Bowel sounds are normal. There is no tenderness.  Neurological: She is alert and oriented to person, place, and time. No cranial nerve deficit. She exhibits normal muscle tone. Coordination normal.       Cranial nerves II-XII grossly intact. Good finger to nose. No facial droop. Patient unable to ambulate romberg and pronator drift not assessed.   Skin: Skin is warm and dry.    ED Course  Procedures (including critical care time)  Labs Reviewed - No data to display No results found.   1. Aphasia   2. Dehydration       MDM  Patient presented with symptoms concerning for stroke. Brocas aphasia and facial droop at rehab facility. Patient now at baseline. INR: sub therapeutic. CBC,CMP unremarkable. UA: CT head w/o contrast: unremarkable. Patient visit shared with Dr. Lorenso Courier who states that patient will most likely need admitted. Spoke with physician on call at [facility] who agrees to see the patient in the next 1-2 hours. He requested MRI with and without contrast pending admission. Patent care will be signed out to [employer].        Pixie Casino, PA-C 08/24/12 1530

## 2012-08-24 NOTE — ED Notes (Addendum)
Per EMS: Pt from [facility]. Called in for possible stroke, negative for stroke symptoms en route; nausea with no vomiting. Dysphasia per staff, no dysphagia on EMA arrival. Episode of headache en route describes as migraine under left eye. 12 lead NSR; pt states blurry vision due to alleriges and macular degeneration. Hx of a.fib. Dizzy and nausea reported. CBG 145 182/79 HR 71 RR 16 98% RA. Denies pain for EMS . EMS gave 4mg  of Zofran and about fluids

## 2012-08-24 NOTE — ED Notes (Signed)
Asked Patient if she needed to urinate.  Patient stated not now.

## 2012-08-24 NOTE — ED Notes (Signed)
Pt arrived from MRI 

## 2012-08-24 NOTE — ED Notes (Signed)
Tia Oliveri, PA at bedside.  

## 2012-08-24 NOTE — ED Provider Notes (Signed)
Medical screening examination/treatment/procedure(s) were conducted as a shared visit with non-physician practitioner(s) and myself.  I personally evaluated the patient during the encounter  Marwan T Powers, MD 08/24/12 1737 

## 2012-08-24 NOTE — Progress Notes (Signed)
ANTICOAGULATION CONSULT NOTE - Initial Consult  Pharmacy Consult for Heparin bridge to therapeutic INR on warfarin Indication: Hx Afib and new TIA  Allergies  Allergen Reactions  . Sulfa Antibiotics Other (See Comments)    [name]  . Celebrex (Celecoxib) Swelling  . Clarithromycin Swelling  . Statins Other (See Comments)    Hair fell out  . Vioxx (Rofecoxib) Other (See Comments)    Renal problems  . Penicillins Rash    Patient Measurements: Height: 5\' 6"  (167.6 cm) Weight: 182 lb 12.2 oz (82.9 kg) IBW/kg (Calculated) : 59.3  Heparin Dosing Weight: 76.7 kg  Vital Signs: Temp: 98.5 F (36.9 C) (11/21 1319) Temp src: Oral (11/21 1208) BP: 190/71 mmHg (11/21 1454) Pulse Rate: 75  (11/21 1454)  Labs:  Basename 08/24/12 1249  HGB 11.7*  HCT 34.9*  PLT 222  APTT --  LABPROT 18.3*  INR 1.57*  HEPARINUNFRC --  CREATININE 1.04  CKTOTAL --  CKMB --  TROPONINI --    Estimated Creatinine Clearance: 46.8 ml/min (by C-G formula based on Cr of 1.04).   Medical History: Past Medical History  Diagnosis Date  . A-fib   . Hypertension   . High cholesterol   . Macular degeneration     Assessment: 78 y.o. F who presented to the [facility] with diffuse headache and photophobia to r/o TIA. Pt is s/p recent R hip hemiarthroplasty in October and has been residing at [facility] for rehab. The patient has a history of Afib and presented with a SUBtherapeutic INR of 1.58 on warfarin. Pharmacy was consulted to start heparin bridge and resume warfarin.  PTA the patient's NH report stated she was taking 2 mg only on Tues/Wed/Thurs/Sat -- no warfarin was being given on Sun/Mon/Fri. Given the patient's stroke-like symptoms -- will not bolus heparin. CT and MRI are thus far negative -- will f/u repeat testing. If stroke confirmed -- will lower goal range to 0.3-0.5 units/ml. Hgb/Hct slightly low on admit, plts wnl.   Goal of Therapy:  Heparin level 0.3-0.7 units/ml Monitor platelets  by anticoagulation protocol: Yes   Plan:  1. Initiate heparin at drip rate of 1050 units/hr (10.5 ml/hr) 2. Warfarin 3 mg x 1 dose at 1800 today 3. Daily heparin levels, CBC, PT/INR 4. Will continue to monitor for any signs/symptoms of bleeding and will follow up with heparin level in 8 hours and PT/INR in the a.m  Georgina Pillion, PharmD, BCPS Clinical Pharmacist Pager: [phone] 08/24/2012 6:12 PM

## 2012-08-24 NOTE — ED Notes (Signed)
Explained to Patient and family member about the orthostatic vital signs.  Family member stated she recently had a total hip replacement and would not be able to stand.  Patient also stated she felt her head start hurting while being laid back and while sitting up she felt dizzy.

## 2012-08-24 NOTE — ED Notes (Signed)
Pt states has had headache and nausea since Tuesday; pt currently denies nausea and headache.

## 2012-08-24 NOTE — ED Notes (Addendum)
Billie from Lab called and stated that urine sent down did not have a label. Notified Noreene Larsson, EMT.

## 2012-08-25 DIAGNOSIS — I517 Cardiomegaly: Secondary | ICD-10-CM

## 2012-08-25 HISTORY — PX: TRANSTHORACIC ECHOCARDIOGRAM: SHX275

## 2012-08-25 LAB — BASIC METABOLIC PANEL
BUN: 10 mg/dL (ref 6–23)
GFR calc Af Amer: 62 mL/min — ABNORMAL LOW (ref >90)
GFR calc non Af Amer: 54 mL/min — ABNORMAL LOW (ref >90)
Potassium: 3.3 mEq/L — ABNORMAL LOW (ref 3.5–5.1)

## 2012-08-25 LAB — URINE CULTURE: Colony Count: 90000

## 2012-08-25 LAB — CBC
Hemoglobin: 10.5 g/dL — ABNORMAL LOW (ref 12.0–15.0)
MCHC: 32.9 g/dL (ref 30.0–36.0)
RDW: 14.7 % (ref 11.5–15.5)

## 2012-08-25 LAB — HEPARIN LEVEL (UNFRACTIONATED): Heparin Unfractionated: 0.14 IU/mL — ABNORMAL LOW (ref 0.30–0.70)

## 2012-08-25 LAB — PROTIME-INR
INR: 1.77 — ABNORMAL HIGH (ref 0.00–1.49)
Prothrombin Time: 20 seconds — ABNORMAL HIGH (ref 11.6–15.2)

## 2012-08-25 MED ORDER — WARFARIN SODIUM 3 MG PO TABS
3.0000 mg | ORAL_TABLET | Freq: Once | ORAL | Status: AC
  Filled 2012-08-25: qty 1

## 2012-08-25 MED ORDER — ACETAMINOPHEN 325 MG PO TABS
650.0000 mg | ORAL_TABLET | Freq: Once | ORAL | Status: AC
  Administered 2012-08-25: 650 mg via ORAL
  Filled 2012-08-25: qty 2

## 2012-08-25 MED ORDER — HYDRALAZINE HCL 20 MG/ML IJ SOLN
10.0000 mg | Freq: Four times a day (QID) | INTRAMUSCULAR | Status: DC | PRN
  Filled 2012-08-25: qty 0.5

## 2012-08-25 MED ORDER — LISINOPRIL 10 MG PO TABS
10.0000 mg | ORAL_TABLET | Freq: Every day | ORAL | Status: DC
  Administered 2012-08-25 – 2012-08-26 (×2): 10 mg via ORAL
  Filled 2012-08-25 (×2): qty 1

## 2012-08-25 MED ORDER — MORPHINE SULFATE 2 MG/ML IJ SOLN: 2.0000 mg | INTRAMUSCULAR | Status: DC | PRN

## 2012-08-25 NOTE — Progress Notes (Signed)
  Echocardiogram 2D Echocardiogram with Bubble Study has been performed.  Madeline Serrano 08/25/2012, 3:07 PM

## 2012-08-25 NOTE — Progress Notes (Signed)
*  PRELIMINARY RESULTS* Vascular Ultrasound Carotid Duplex (Doppler) has been completed.  Preliminary findings: Bilaterally <40% ICA stenosis with antegrade vertebral flow.  Farrel Demark, RDMS, RVT  08/25/2012, 2:25 PM

## 2012-08-25 NOTE — Progress Notes (Signed)
OT Cancellation Note  Patient Details Name: Madeline Serrano MRN: [id number] DOB: Sep 30, 1930   Cancelled Treatment:     Due to pt receiving test in room. Will re-attempt eval 08/26/2102.  [name] [phone] 08/25/2012, 6:10 PM

## 2012-08-25 NOTE — Progress Notes (Signed)
ANTICOAGULATION CONSULT NOTE - Follow Up Consult  Pharmacy Consult for Heparin and Coumadin Indication: atrial fibrillation and new TIA  Allergies  Allergen Reactions  . Sulfa Antibiotics Other (See Comments)    [name]  . Celebrex (Celecoxib) Swelling  . Clarithromycin Swelling  . Statins Other (See Comments)    Hair fell out  . Vioxx (Rofecoxib) Other (See Comments)    Renal problems  . Penicillins Rash    Patient Measurements: Height: 5\' 6"  (167.6 cm) Weight: 183 lb 11.2 oz (83.326 kg) IBW/kg (Calculated) : 59.3  Heparin Dosing Weight: 77 kg  Vital Signs: Temp: 98.3 F (36.8 C) (11/22 0928) Temp src: Oral (11/22 0928) BP: 157/64 mmHg (11/22 0928) Pulse Rate: 73  (11/22 0928)  Labs:  Basename 08/25/12 1138 08/25/12 0230 08/24/12 1249  HGB -- 10.5* 11.7*  HCT -- 31.9* 34.9*  PLT -- 195 222  APTT -- -- --  LABPROT -- 20.0* 18.3*  INR -- 1.77* 1.57*  HEPARINUNFRC 0.36 0.14* --  CREATININE -- 0.97 1.04  CKTOTAL -- -- --  CKMB -- -- --  TROPONINI -- -- --    Estimated Creatinine Clearance: 50.3 ml/min (by C-G formula based on Cr of 0.97).  Assessment:   Heparin level is now therapeutic on 1300 units/hr.   INR rising after Coumadin 3 mg dose on 11/21.   Home Coumadin regimen: 2 mg Tues/Wed/Thurs/Sat - none on Sun/Mon/Fri.  Unusual regimen. Will likely need daily dosing going forward.    Goal of Therapy:  INR 2-3 Heparin level 0.3-0.5 units/ml, low therapeutic  Monitor platelets by anticoagulation protocol: Yes   Plan:   Continue heparin drip at 1300 units/hr.  Repeat Coumadin 3 mg today.  Continue daily heparin level, PT/INR and CBC.  Dennie Fetters, [place]  Pager: [phone] 08/25/2012,2:27 PM

## 2012-08-25 NOTE — Evaluation (Signed)
Physical Therapy Evaluation Patient Details Name: Madeline Serrano MRN: [id number] DOB: 09/05/1930 Today's Date: 08/25/2012 Time: 1103-1140 PT Time Calculation (min): 37 min  PT Assessment / Plan / Recommendation Clinical Impression  78 yo adm with r/o CVA vs migraine vs TIA with resolution of symptoms (word-finding and severe headache).Pt was scheduled to d/c home from [facility] on Sunday 11/24 after 8 weeks recovering from fall with Rt hip hemiarthoplasty. Pt currently moving well enough to d/c home with her son's assistance (as she had planned). Will follow for education.    PT Assessment  Patient needs continued PT services    Follow Up Recommendations  Home health PT;Supervision/Assistance - 24 hour    Does the patient have the potential to tolerate intense rehabilitation      Barriers to Discharge None      Equipment Recommendations  Rolling walker with 5" wheels;3 in 1 bedside comode    Recommendations for Other Services     Frequency Min 6X/week    Precautions / Restrictions Precautions Precautions: Posterior Hip;Fall Restrictions Weight Bearing Restrictions: Yes RLE Weight Bearing: Weight bearing as tolerated   Pertinent Vitals/Pain Denies pain; dyspnea 3/4 with walking (pt reports this is typical for her due to COPD)      Mobility  Bed Mobility Bed Mobility: Supine to Sit;Sitting - Scoot to Edge of Bed;Sit to Supine Supine to Sit: 5: Supervision;HOB flat Sitting - Scoot to Edge of Bed: 5: Supervision Sit to Supine: 6: Modified independent (Device/Increase time);HOB flat Details for Bed Mobility Assistance: pt reports she will enter/exit her bed from the Lt side, however has only practiced going to the Rt at [facility]. Pt required cues to sequence and prevent internal rotation of RLE as exiting to Lt. Returned to supine without difficulty. Required incr time and effort on 2nd attempt. Transfers Transfers: Sit to Stand;Stand to Sit Sit to Stand: 6: Modified  independent (Device/Increase time);With upper extremity assist;From bed;From toilet Stand to Sit: 6: Modified independent (Device/Increase time);With upper extremity assist;With armrests;To chair/3-in-1;To toilet Details for Transfer Assistance: pt pushes down on handles of RW when rising from EOB (discussed risks and pt able to demonstrate without tipping or sliding RW) Ambulation/Gait Ambulation/Gait Assistance: 5: Supervision Ambulation Distance (Feet): 150 Feet Assistive device: Rolling walker Ambulation/Gait Assistance Details: supervision for IV and one staggering step to her right with pt able to recover without physical assist Gait Pattern: Step-through pattern;Decreased stride length;Trunk flexed Stairs: Yes Stairs Assistance: 5: Supervision Stairs Assistance Details (indicate cue type and reason): pt able to correctly verbalize and demonstrate one step technique; Supervision due to IV line (pt with decr attention to line) Stair Management Technique: No rails;Backwards;With walker Number of Stairs: 1     Shoulder Instructions     Exercises     PT Diagnosis: Difficulty walking  PT Problem List: Decreased mobility;Decreased balance;Decreased knowledge of use of DME PT Treatment Interventions: DME instruction;Gait training;Functional mobility training;Therapeutic activities;Patient/family education   PT Goals Acute Rehab PT Goals PT Goal Formulation: With patient Time For Goal Achievement: 08/27/12 Potential to Achieve Goals: Good Pt will go Supine/Side to Sit: with modified independence;with HOB 0 degrees;Other (comment) (maintaining hip precautions) PT Goal: Supine/Side to Sit - Progress: Goal set today Pt will go Sit to Stand: with modified independence;with upper extremity assist PT Goal: Sit to Stand - Progress: Goal set today Pt will go Stand to Sit: with modified independence;with upper extremity assist PT Goal: Stand to Sit - Progress: Goal set today Pt will Ambulate:  >150 feet;with  supervision;with least restrictive assistive device;Other (comment) (no staggering loss of balance) PT Goal: Ambulate - Progress: Goal set today Additional Goals Additional Goal #1: Pt will verbalize and adhere to hip precautions while mobilizing PT Goal: Additional Goal #1 - Progress: Goal set today  Visit Information  Last PT Received On: 08/25/12 Assistance Needed: +1    Subjective Data  Subjective: Pt reports she was on inpt rehab, then [facility], and about to d/c home on Sunday 11/24 Patient Stated Goal: return home Sunday (vs return to [facility])   Prior Functioning  Home Living Lives With: Alone Available Help at Discharge: Family;Available 24 hours/day (son coming to stay 8-9 days to help her transition home) Type of Home: House Home Access: Stairs to enter [employer] of Steps: 1 Entrance Stairs-Rails: None Home Layout: Two level;Able to live on main level with bedroom/bathroom Alternate Level Stairs-Number of Steps:  (will not be using 2nd floor) Bathroom Shower/Tub: Health visitor: Standard Bathroom Accessibility: Yes How Accessible: Accessible via walker Home Adaptive Equipment: Grab bars around toilet;Grab bars in shower;Built-in shower seat (son installing grab bars on 11/23) Prior Function Level of Independence: Independent (prior to hip fracture) Able to Take Stairs?: Yes Driving: Yes Vocation: Retired Musician: Clinical cytogeneticist  Overall Cognitive Status: Appears within functional limits for tasks assessed/performed Arousal/Alertness: Awake/alert Orientation Level: Oriented X4 / Intact Behavior During Session: [facility] for tasks performed    Extremity/Trunk Assessment Right Lower Extremity Assessment RLE ROM/Strength/Tone: [facility] for tasks assessed Left Lower Extremity Assessment LLE ROM/Strength/Tone: [facility] for tasks assessed   Balance    End of Session PT - End of Session Activity Tolerance:  Patient tolerated treatment well Patient left: in chair;with call bell/phone within reach  GP Functional Assessment Tool Used: clinical judgment Functional Limitation: Mobility: Walking and moving around Mobility: Walking and Moving Around Current Status ([id number]): At least 1 percent but less than 20 percent impaired, limited or restricted Mobility: Walking and Moving Around Goal Status [id number]): At least 1 percent but less than 20 percent impaired, limited or restricted   Emilygrace Grothe 08/25/2012, 12:02 PM  Pager [phone]

## 2012-08-25 NOTE — Progress Notes (Signed)
ANTICOAGULATION CONSULT NOTE - Follow Up Consult  Pharmacy Consult for heparin Indication: atrial fibrillation  Labs:  Basename 08/25/12 0230 08/24/12 1249  HGB 10.5* 11.7*  HCT 31.9* 34.9*  PLT 195 222  APTT -- --  LABPROT 20.0* 18.3*  INR 1.77* 1.57*  HEPARINUNFRC 0.14* --  CREATININE -- 1.04  CKTOTAL -- --  CKMB -- --  TROPONINI -- --    Assessment: 78yo female subtherapeutic on heparin with initial dosing for Afib with cautious dosing given possible TIA; INR rising but remains subtherapeutic.  Goal of Therapy:  Heparin level 0.3-0.7 units/ml   Plan:  Will increase heparin gtt by 3 units/kg/hr to 1300 units/hr and check level in 8hr.  Colleen Can PharmD BCPS 08/25/2012,3:45 AM

## 2012-08-25 NOTE — Progress Notes (Signed)
Subjective: History reviewed with patient- describes 3 days of visual changes (vertical lines reminiscent of visual aura that she gets with migraine headaches) associated with severe headache.  Yesterday experienced some difficulty with word-finding (substituted wrong words).  No issues with speech today though her mouth is dry.  BP has been elevated recently (170s/90s).    Objective: Vital signs in last 24 hours: Temp:  [97.4 F (36.3 C)-98.5 F (36.9 C)] 98.4 F (36.9 C) (11/22 0630) Pulse Rate:  [69-75] 71  (11/22 0630) Resp:  [13-18] 18  (11/22 0630) BP: (176-208)/(55-73) 179/61 mmHg (11/22 0630) SpO2:  [97 %-100 %] 97 % (11/22 0630) Weight:  [82.9 kg (182 lb 12.2 oz)-83.326 kg (183 lb 11.2 oz)] 83.326 kg (183 lb 11.2 oz) (11/22 0500) Weight change:  Last BM Date: 08/24/12  CBG (last 3)  No results found for this basename: GLUCAP:3 in the last 72 hours  Intake/Output from previous day:   Intake/Output this shift:    General appearance: alert and no distress Eyes: no scleral icterus Throat: oropharynx moist without erythema Resp: clear to auscultation bilaterally Cardio: regular rate and rhythm GI: soft, non-tender; bowel sounds normal; no masses,  no organomegaly Extremities: no clubbing, cyanosis or edema Neuro: speech intact- CN intact, no dysarthria, motor strength 5/5, DTRs symmetric   Lab Results:  Basename 08/25/12 0230 08/24/12 1249  NA 130* 130*  K 3.3* 4.0  CL 96 93*  CO2 24 25  GLUCOSE 95 117*  BUN 10 13  CREATININE 0.97 1.04  CALCIUM 8.8 9.7  MG -- --  PHOS -- --    Basename 08/24/12 1249  AST 39*  ALT 25  ALKPHOS 109  BILITOT 0.5  PROT 7.0  ALBUMIN 3.5    Basename 08/25/12 0230 08/24/12 1249  WBC 6.8 8.0  NEUTROABS -- 5.5  HGB 10.5* 11.7*  HCT 31.9* 34.9*  MCV 90.6 91.1  PLT 195 222   Lab Results  Component Value Date   INR 1.77* 08/25/2012   INR 1.57* 08/24/2012   INR 3.41* 07/17/2012   No results found for this basename:  CKTOTAL:3,CKMB:3,CKMBINDEX:3,TROPONINI:3 in the last 72 hours No results found for this basename: TSH,T4TOTAL,FREET3,T3FREE,THYROIDAB in the last 72 hours No results found for this basename: VITAMINB12:2,FOLATE:2,FERRITIN:2,TIBC:2,IRON:2,RETICCTPCT:2 in the last 72 hours  Studies/Results: Ct Head Wo Contrast  08/24/2012  *RADIOLOGY REPORT*  Clinical Data: 78 year old female with headache, nausea, dizziness.  CT HEAD WITHOUT CONTRAST  Technique:  Contiguous axial images were obtained from the base of the skull through the vertex without contrast.  Comparison: None.  Findings: Mucoperiosteal thickening of the left maxillary sinus and to a lesser extent both sphenoid sinuses.  Mild bubbly opacity in the right sphenoid.  Other Visualized paranasal sinuses and mastoids are clear.  No acute osseous abnormality identified.  No acute orbit soft tissue findings.  Negative scalp soft tissues.  Cerebral volume is within normal limits for age.  No midline shift, ventriculomegaly, mass effect, evidence of mass lesion, intracranial hemorrhage or evidence of cortically based acute infarction.  Gray-white matter differentiation is within normal limits throughout the brain.  No suspicious intracranial vascular hyperdensity.  IMPRESSION: 1. Normal noncontrast CT appearance of the brain for age. 2.  Chronic paranasal sinusitis.   Original Report Authenticated By: Erskine Speed, M.D.    Mr Laqueta Jean Wo Contrast  08/24/2012  *RADIOLOGY REPORT*  Clinical Data: Headache with nausea and dizziness.  Atrial fibrillation and hypertension.  Possible CVA.  MRI HEAD WITHOUT AND WITH CONTRAST  Technique:  Multiplanar, multiecho pulse sequences of the brain and surrounding structures were obtained according to standard protocol without and with intravenous contrast  Contrast:  MultiHance 17 ml.  Comparison:  CT head earlier in the day.  Findings:  There is no evidence for acute infarction, intracranial hemorrhage, mass lesion,  hydrocephalus, or extra-axial fluid. Moderate atrophy is present.  Mild chronic microvascular ischemic change is present in the periventricular and subcortical white matter. No foci of chronic hemorrhage.  Moderate sized lacuna left periventricular white matter.  Calvarium intact.  Significant left maxillary sinus disease.  Bilateral cataract extraction.  Normal midline anatomy.  Intracranial vasculature widely patent.  Post infusion, no abnormal enhancement of the brain or meninges.  IMPRESSION: Moderate atrophy.  Mild white matter changes.  No acute stroke or acute hemorrhage.  No abnormal intracranial enhancement.  Unchanged from prior CT.   Original Report Authenticated By: Davonna Belling, M.D.      Medications: Scheduled:   . [COMPLETED] acetaminophen  650 mg Oral Once  . aspirin  81 mg Oral Daily  . carvedilol  25 mg Oral BID WC  . digoxin  0.125 mg Oral Daily  . docusate sodium  100 mg Oral BID  . flecainide  50 mg Oral BID  . [COMPLETED] gadobenate dimeglumine  17 mL Intravenous Once  . [COMPLETED] sodium chloride  1,000 mL Intravenous STAT  . [COMPLETED] sodium chloride  500 mL Intravenous Once  . tiotropium  18 mcg Inhalation Daily  . triamterene-hydrochlorothiazide  1 each Oral Daily  . [COMPLETED] warfarin  3 mg Oral Once  . Warfarin - Pharmacist Dosing Inpatient   Does not apply q1800   Continuous:   . heparin 1,300 Units/hr (08/25/12 0415)    Assessment/Plan: Principal Problem: 1. *TIA (transient ischemic attack)- symptoms may be related to complex migraine vs. HTN effects.  MRI normal.  Given transient speech changes will proceed with TIA evaluation with Echo and carotid dopplers.  Continue ASA, anticoagulation for CVA prevention in setting of Afib.  Will work on BP control.  Check Fasting lipids in AM.  Active Problems: 2. Hypertension- BP has been high recently which may be contributing to Migraine HA or TIA.  Add Lisinopril 10mg  daily and monitor.  Continue Maxzide and  Coreg. 3. Atrial fibrillation- currently in NSR.  Continue Coreg/Fleicanide/Digoxin.  Heparin until INR>2.0. Recent Hip fracture, right- has done well with rehab.  Continue PT/OT. 4. COPD (chronic obstructive pulmonary disease)- monitor for increased cough with ACE inhibitor. 5. Hyponatremia- stable.  Monitor.   6. Disposition- she was planning on discharge from SNF on Sunday.  PT/OT evaluation.  If she is stable will go ahead and discharge to home on Sunday (her family will not be arriving until tomorrow to prepare home).  If PT/OT feel that further rehab is needed she can be discharged back to [facility] tomorrow if BP stable.  Will go ahead and order DME that will be needed (bedside commode, rolling walker, home health) in preparation for discharge home.    LOS: 1 day   Chantalle Defilippo,W DOUGLAS 08/25/2012, 7:41 AM

## 2012-08-25 NOTE — Care Management Note (Addendum)
    Page 1 of 2   09/01/2012     12:24:03 PM   CARE MANAGEMENT NOTE 09/01/2012  Patient:  Madeline Serrano, Madeline Serrano   Account Number:  [id number]  Date Initiated:  08/25/2012  Documentation initiated by:  [facility]  Subjective/Objective Assessment:   Admitted with TIA.     Action/Plan:   Pt/OT evals- rocommending HHPT and HHOT, rolling walker and 3N1   Anticipated DC Date:  09/01/2012   Anticipated DC Plan:  HOME W HOME HEALTH SERVICES      DC Planning Services  CM consult      [facility]  HOME HEALTH   Choice offered to / List presented to:  C-1 Patient   DME arranged  3-N-1  Levan Hurst      DME agency  [facility]     [facility] arranged  HH-1 RN  HH-2 PT  HH-3 OT      [facility] agency  [facility]   Status of service:  Completed, signed off Medicare Important Message given?   (If response is "NO", the following Medicare IM given date fields will be blank) Date Medicare IM given:   Date Additional Medicare IM given:    Discharge Disposition:  HOME W HOME HEALTH SERVICES  Per UR Regulation:  Reviewed for med. necessity/level of care/duration of stay  If discussed at Long Length of Stay Meetings, dates discussed:    Comments:  09/01/12 12:22 Madeline Cape RN, BSN [phone] patient is for dc today, called Madeline Serrano with [facility] to notify of dc today. Patient's son is here for transport.  Madeline Cousin, RN Case Manager Signed CASE MANAGEMENT Progress Notes 08/28/2012 11:55 AM [facility] will follow for [facility]. Madeline Donning RN CCM Case Mgmt phone [phone]   11/125/2013 1045 NCM spoke to pt and she requested [employer] for [facility]. She does want RW and 3n1 for home. NCM contacted Madeline Serrano rep for [facility]. Left message. Madeline Donning RN CCM [phone]  08/26/11 HHRN, HHPT and HHOT ordered.Spoke with patient about HHC, gave her [employer] of [facility] agencies. She wants to spreak with her son about the decision and will make a decision on 08/27/11.She will  need a rolling walker and 3N1, [facility] aware, should deliver equipment to patient's room prior to d/c. CM will continue to follow. Madeline Cree RN, BSN, CCM

## 2012-08-25 NOTE — Progress Notes (Signed)
Pt complaining of severe headache rated 10/10. No improvement after Vicodin given. VS taken and stable. Dr. Clelia Croft notified over the phone. Morphine and Hydralazine ordered. Pt notified of conversation with Dr. Clelia Croft. Refused both medications and says she "wants to just wait." VS stable at this time, neurological exam intact. Will continue to monitor.

## 2012-08-26 ENCOUNTER — Observation Stay (HOSPITAL_COMMUNITY): Payer: Medicare Other

## 2012-08-26 LAB — CBC
MCH: 29.6 pg (ref 26.0–34.0)
MCV: 91 fL (ref 78.0–100.0)
Platelets: 203 10*3/uL (ref 150–400)
RDW: 14.6 % (ref 11.5–15.5)

## 2012-08-26 LAB — LIPID PANEL: LDL Cholesterol: 68 mg/dL (ref 0–99)

## 2012-08-26 LAB — BASIC METABOLIC PANEL
BUN: 9 mg/dL (ref 6–23)
Creatinine, Ser: 1.03 mg/dL (ref 0.50–1.10)
GFR calc Af Amer: 58 mL/min — ABNORMAL LOW (ref >90)
GFR calc non Af Amer: 50 mL/min — ABNORMAL LOW (ref >90)
Potassium: 3.6 mEq/L (ref 3.5–5.1)

## 2012-08-26 MED ORDER — IRBESARTAN 150 MG PO TABS
150.0000 mg | ORAL_TABLET | Freq: Every day | ORAL | Status: DC
  Filled 2012-08-26: qty 1

## 2012-08-26 MED ORDER — POLYSACCHARIDE IRON COMPLEX 150 MG PO CAPS
150.0000 mg | ORAL_CAPSULE | Freq: Every day | ORAL | Status: DC
  Administered 2012-08-27 – 2012-08-30 (×4): 150 mg via ORAL
  Filled 2012-08-26 (×7): qty 1

## 2012-08-26 MED ORDER — HYDRALAZINE HCL 20 MG/ML IJ SOLN
10.0000 mg | Freq: Four times a day (QID) | INTRAMUSCULAR | Status: DC | PRN
  Filled 2012-08-26: qty 0.5

## 2012-08-26 MED ORDER — CLOPIDOGREL BISULFATE 75 MG PO TABS
75.0000 mg | ORAL_TABLET | Freq: Every day | ORAL | Status: DC
  Administered 2012-08-27: 75 mg via ORAL
  Filled 2012-08-26 (×2): qty 1

## 2012-08-26 MED ORDER — WARFARIN SODIUM 1 MG PO TABS
1.5000 mg | ORAL_TABLET | Freq: Once | ORAL | Status: AC
  Administered 2012-08-26: 1.5 mg via ORAL
  Filled 2012-08-26: qty 1

## 2012-08-26 NOTE — Evaluation (Signed)
Occupational Therapy Evaluation Patient Details Name: CASH MEADOW MRN: [id number] DOB: [birth year] Today's Date: 08/26/2012 Time: 5284-1324 OT Time Calculation (min): 33 min  OT Assessment / Plan / Recommendation Clinical Impression  This 78 yo female admitted with diffuse headache and word finding problems as well as a recent right hip replacement presents to acute OT with the problems below. Will benefit from acute OT with follow up HHOT.    OT Assessment  Patient needs continued OT Services    Follow Up Recommendations  Home health OT    Barriers to Discharge None    Equipment Recommendations  Rolling walker with 5" wheels;3 in 1 bedside comode       Frequency  Min 2X/week    Precautions / Restrictions Precautions Precautions: Fall;Posterior Hip Precaution Comments: Pt's son present and educated on post hip precautions Restrictions RLE Weight Bearing: Weight bearing as tolerated       ADL  Eating/Feeding: Simulated;Independent Where Assessed - Eating/Feeding: Chair Grooming: Simulated;Set up Where Assessed - Grooming: Unsupported sitting Upper Body Bathing: Simulated;Set up Where Assessed - Upper Body Bathing: Unsupported sitting Lower Body Bathing: Simulated;Moderate assistance (without AE) Where Assessed - Lower Body Bathing: Supported sit to stand Upper Body Dressing: Simulated;Set up Where Assessed - Upper Body Dressing: Unsupported sitting Lower Body Dressing: Simulated;Moderate assistance (without AE) Where Assessed - Lower Body Dressing: Supported sit to stand Equipment Used: Gait belt;Rolling walker    OT Diagnosis: Generalized weakness  OT Problem List: Decreased activity tolerance;Impaired balance (sitting and/or standing);Decreased knowledge of use of DME or AE;Decreased knowledge of precautions OT Treatment Interventions: Self-care/ADL training;DME and/or AE instruction;Patient/family education;Balance training   OT Goals Acute Rehab OT Goals OT  Goal Formulation: With patient Time For Goal Achievement: 09/02/12 Potential to Achieve Goals: Good ADL Goals Pt Will Perform Lower Body Bathing: with set-up;with supervision;Unsupported;Standing at sink;Sitting at sink;with adaptive equipment ADL Goal: Lower Body Bathing - Progress: Goal set today Pt Will Perform Lower Body Dressing: with set-up;with supervision;Sit to stand from bed;Sit to stand from chair;Unsupported;with adaptive equipment ADL Goal: Lower Body Dressing - Progress: Goal set today Pt Will Transfer to Toilet: with supervision;Ambulation;Comfort height toilet;Grab bars ADL Goal: Toilet Transfer - Progress: Goal set today Pt Will Perform Toileting - Clothing Manipulation: with supervision;Standing ADL Goal: Toileting - Clothing Manipulation - Progress: Goal set today Pt Will Perform Toileting - Hygiene: with supervision;Sit to stand from 3-in-1/toilet ADL Goal: Toileting - Hygiene - Progress: Goal set today Pt Will Perform Tub/Shower Transfer: Shower transfer;with supervision;Ambulation;with DME;Shower seat without back;Grab bars;Maintaining hip precautions ADL Goal: Tub/Shower Transfer - Progress: Goal set today Miscellaneous OT Goals Miscellaneous OT Goal #1: Pt will be independent in and OOB for BADLs. OT Goal: Miscellaneous Goal #1 - Progress: Goal set today Miscellaneous OT Goal #2: Pt will be able to state 3/3 hip precautions. OT Goal: Miscellaneous Goal #2 - Progress: Goal set today Miscellaneous OT Goal #3: Son will state that he feels comfortable seeing all the education done with the patient, so that he will understand how she is to do things in case she needs cuing. OT Goal: Miscellaneous Goal #3 - Progress: Goal set today  Visit Information  Last OT Received On: 08/26/12 Assistance Needed: +1 PT/OT Co-Evaluation/Treatment: Yes    Subjective Data  Subjective: My left hand feels funny Patient Stated Goal: Go home tomorrow   Prior Functioning     Dominant  Hand: Right            Cognition  Overall Cognitive Status:  Appears within functional limits for tasks assessed/performed Arousal/Alertness: Awake/alert Orientation Level: Appears intact for tasks assessed Behavior During Session: [facility] for tasks performed    Extremity/Trunk Assessment Right Upper Extremity Assessment RUE ROM/Strength/Tone: Within functional levels Left Upper Extremity Assessment LUE ROM/Strength/Tone: Within functional levels     Mobility Bed Mobility Sit to Supine: 5: Supervision;HOB flat Details for Bed Mobility Assistance: supervision with lying down after transfering from chair for continued monitoring after being dizzy and presenting with symptoms of a TIA while working with therapies (see below for more details). Transfers Sit to Stand: 5: Supervision;From chair/3-in-1;With upper extremity assist;With armrests Stand to Sit: 4: Min guard;To bed;To chair/3-in-1;With upper extremity assist;With armrests Details for Transfer Assistance: pt stood first time from chair with supervision and cues for safe handplacement. stdg at walker for ther ex and pt reported feeling dizzy and needing to sit down. Min guard with sitting down and OT out of room to secure dinamap to check BP's. min assist with second standing trial with no dizziness. During second stand it was noted that pt was having incr slurred speech and right facial aysmetry and pt returned to chair. Once seated pt stated "my arm feels funny" refering to her left arm. RN notifed and to room and informed of symptoms. MD called by RN in room. During this time pt transfered via a stand pivot to the bed. Once in bed pt's speech and facial symetry were noted to be improving. RN and MD continue to monitor.                                End of Session OT - End of Session Equipment Utilized During Treatment: Gait belt Activity Tolerance:  (Limited by dizziness and nausea, then stroke like symptoms) Patient left: in  bed;with call bell/phone within reach;with family/visitor present;with bed alarm set Nurse Communication:  (about stroke like symptoms and vitals)       [name] [phone] 08/26/2012, 2:14 PM

## 2012-08-26 NOTE — Progress Notes (Signed)
Pt BP high. Offered prn hydrolizine. Pt declines at this time. Currently under no s./s distress.

## 2012-08-26 NOTE — Progress Notes (Addendum)
Results of today's brain MRA reviewed. The study shows distal small vessel intracranial atherosclerosis not amenable to angioplasty procedure, so we will continue current stroke prevention with coumadin, plavix, and aspirin. I will discuss the results with the patient and her son tomorrow. Nursing staff can feel free to pass on to the family this plan.

## 2012-08-26 NOTE — Progress Notes (Signed)
ANTICOAGULATION CONSULT NOTE - Follow Up Consult  Pharmacy Consult for Heparin and Coumadin Indication: atrial fibrillation and new TIA  Allergies  Allergen Reactions  . Sulfa Antibiotics Other (See Comments)    [name]  . Celebrex (Celecoxib) Swelling  . Clarithromycin Swelling  . Statins Other (See Comments)    Hair fell out  . Vioxx (Rofecoxib) Other (See Comments)    Renal problems  . Penicillins Rash    Patient Measurements: Height: 5\' 6"  (167.6 cm) Weight: 183 lb 11.2 oz (83.326 kg) IBW/kg (Calculated) : 59.3  Heparin Dosing Weight: 77 kg  Vital Signs: Temp: 98.1 F (36.7 C) (11/23 1030) Temp src: Oral (11/23 1030) BP: 111/41 mmHg (11/23 1255) Pulse Rate: 70  (11/23 1255)  Labs:  Basename 08/26/12 0615 08/25/12 1138 08/25/12 0230 08/24/12 1249  HGB 10.8* -- 10.5* --  HCT 33.2* -- 31.9* 34.9*  PLT 203 -- 195 222  APTT -- -- -- --  LABPROT 24.1* -- 20.0* 18.3*  INR 2.28* -- 1.77* 1.57*  HEPARINUNFRC 0.36 0.36 0.14* --  CREATININE 1.03 -- 0.97 1.04  CKTOTAL -- -- -- --  CKMB -- -- -- --  TROPONINI -- -- -- --    Estimated Creatinine Clearance: 47.4 ml/min (by C-G formula based on Cr of 1.03).  Assessment:   Heparin level remains therapeutic on 1300 units/hr.   INR in goal range, protime increased by 4 sec.   Home Coumadin regimen: 2 mg Tues/Wed/Thurs/Sat - none on Sun/Mon/Fri.  Unusual regimen. Will likely need daily dosing going forward.    Goal of Therapy:  INR 2-3 Heparin level 0.3-0.5 units/ml, low therapeutic  Monitor platelets by anticoagulation protocol: Yes   Plan:   D/C heparin  Decrease Coumadin to 1.5 mg today.  Continue daily PT/INR  Woodfin Ganja, PharmD Pager: [phone] 08/26/2012,2:11 PM

## 2012-08-26 NOTE — Progress Notes (Signed)
Subjective: She continues to have intermittent lines in right visual field with headaches, no dizziness or problems with speech today.   Objective: Vital signs in last 24 hours: Temp:  [97.8 F (36.6 C)-98.4 F (36.9 C)] 98.1 F (36.7 C) (11/23 1030) Pulse Rate:  [64-70] 69  (11/23 1030) Resp:  [18-20] 20  (11/23 1030) BP: (146-189)/(48-71) 188/66 mmHg (11/23 1030) SpO2:  [98 %-100 %] 99 % (11/23 1030) Weight change:    Intake/Output from previous day: 11/22 0701 - 11/23 0700 In: 257 [I.V.:257] Out: -    General appearance: alert, cooperative and no distress Resp: clear to auscultation bilaterally Cardio: regular rate and rhythm, S1, S2 normal, no murmur, click, rub or gallop GI: soft, non-tender; bowel sounds normal; no masses,  no organomegaly Extremities: extremities normal, atraumatic, no cyanosis or edema Neurologic: Mental status: Alert, oriented, thought content appropriate Cranial nerves: normal Gait: able to walk with walker independently  Lab Results:  Basename 08/26/12 0615 08/25/12 0230  WBC 6.8 6.8  HGB 10.8* 10.5*  HCT 33.2* 31.9*  PLT 203 195   BMET  Basename 08/26/12 0615 08/25/12 0230  NA 130* 130*  K 3.6 3.3*  CL 95* 96  CO2 26 24  GLUCOSE 114* 95  BUN 9 10  CREATININE 1.03 0.97  CALCIUM 9.3 8.8   CMET CMP     Component Value Date/Time   NA 130* 08/26/2012 0615   K 3.6 08/26/2012 0615   CL 95* 08/26/2012 0615   CO2 26 08/26/2012 0615   GLUCOSE 114* 08/26/2012 0615   BUN 9 08/26/2012 0615   CREATININE 1.03 08/26/2012 0615   CALCIUM 9.3 08/26/2012 0615   PROT 7.0 08/24/2012 1249   ALBUMIN 3.5 08/24/2012 1249   AST 39* 08/24/2012 1249   ALT 25 08/24/2012 1249   ALKPHOS 109 08/24/2012 1249   BILITOT 0.5 08/24/2012 1249   GFRNONAA 50* 08/26/2012 0615   GFRAA 58* 08/26/2012 0615    CBG (last 3)  No results found for this basename: GLUCAP:3 in the last 72 hours  INR RESULTS:   Lab Results  Component Value Date   INR 2.28*  08/26/2012   INR 1.77* 08/25/2012   INR 1.57* 08/24/2012     Studies/Results: Ct Head Wo Contrast  08/24/2012  *RADIOLOGY REPORT*  Clinical Data: 78 year old female with headache, nausea, dizziness.  CT HEAD WITHOUT CONTRAST  Technique:  Contiguous axial images were obtained from the base of the skull through the vertex without contrast.  Comparison: None.  Findings: Mucoperiosteal thickening of the left maxillary sinus and to a lesser extent both sphenoid sinuses.  Mild bubbly opacity in the right sphenoid.  Other Visualized paranasal sinuses and mastoids are clear.  No acute osseous abnormality identified.  No acute orbit soft tissue findings.  Negative scalp soft tissues.  Cerebral volume is within normal limits for age.  No midline shift, ventriculomegaly, mass effect, evidence of mass lesion, intracranial hemorrhage or evidence of cortically based acute infarction.  Gray-white matter differentiation is within normal limits throughout the brain.  No suspicious intracranial vascular hyperdensity.  IMPRESSION: 1. Normal noncontrast CT appearance of the brain for age. 2.  Chronic paranasal sinusitis.   Original Report Authenticated By: Erskine Speed, M.D.    Mr Laqueta Jean Wo Contrast  08/24/2012  *RADIOLOGY REPORT*  Clinical Data: Headache with nausea and dizziness.  Atrial fibrillation and hypertension.  Possible CVA.  MRI HEAD WITHOUT AND WITH CONTRAST  Technique:  Multiplanar, multiecho pulse sequences of the brain and  surrounding structures were obtained according to standard protocol without and with intravenous contrast  Contrast:  MultiHance 17 ml.  Comparison:  CT head earlier in the day.  Findings:  There is no evidence for acute infarction, intracranial hemorrhage, mass lesion, hydrocephalus, or extra-axial fluid. Moderate atrophy is present.  Mild chronic microvascular ischemic change is present in the periventricular and subcortical white matter. No foci of chronic hemorrhage.  Moderate sized  lacuna left periventricular white matter.  Calvarium intact.  Significant left maxillary sinus disease.  Bilateral cataract extraction.  Normal midline anatomy.  Intracranial vasculature widely patent.  Post infusion, no abnormal enhancement of the brain or meninges.  IMPRESSION: Moderate atrophy.  Mild white matter changes.  No acute stroke or acute hemorrhage.  No abnormal intracranial enhancement.  Unchanged from prior CT.   Original Report Authenticated By: Davonna Belling, M.D.     Medications: I have reviewed the patient's current medications.  Assessment/Plan: #1 Expressive Aphasia:  Resolved with no evidence of stroke on MRI exam. sxs consistent with TIA. Will continue coumadin and asa rx, probable discharge to home tomorrow. #2 History of Atrial Fibrillation: stable and currently in a sinus rhythm #3 Anemia: stable on current meds and will add iron. #4 HTN:  BP is too high so will change lisinopril to Avapro, consider adding norvasc if BP remains too high. #5 Right Hip Fracture: stable and doing well with gait.  LOS: 2 days   Natilee Gauer G 08/26/2012, 12:09 PM

## 2012-08-26 NOTE — Progress Notes (Signed)
PT working with pt. PT said pt was having dizziness, L hand "feels funny", week grip of L hand, and R facial droop when pt got up to ambulate.  RN questioned pt, and pt confirmed all findings.  BP continued to decrease as well from sitting to standing (charted).  MD made aware. Orders for CT was given.  Pt currently stable.

## 2012-08-26 NOTE — Progress Notes (Signed)
Physical Therapy Treatment Patient Details Name: Madeline Serrano MRN: [id number] DOB: 1930/04/07 Today's Date: 08/26/2012 Time: 7829-5621 PT Time Calculation (min): 33 min  PT Assessment / Plan / Recommendation Comments on Treatment Session  Limited session today due to medical issues with therapy (see other comments in mobility section). Will plan for family education on mobilty tomorrow/next session as pt is medically able to participate. See vital sign seciton of pt's chart for BPs obtained during session. Pt did have drop in BP with standing that increased once lying down in bed.    Follow Up Recommendations  Home health PT;Supervision/Assistance - 24 hour           Equipment Recommendations  Rolling walker with 5" wheels;3 in 1 bedside comode       Frequency Min 6X/week   Plan Discharge plan remains appropriate;Frequency remains appropriate    Precautions / Restrictions Precautions Precautions: Fall;Posterior Hip Precaution Comments: Pt's son present and educated on post hip precautions Restrictions RLE Weight Bearing: Weight bearing as tolerated       Mobility  Bed Mobility Sit to Supine: 5: Supervision;HOB flat Details for Bed Mobility Assistance: supervision with lying down after transfering from chair for continued monitoring after being dizzy and presenting with symptoms of a TIA while working with therapies (see below for more details). Transfers Transfers: Stand Pivot Transfers Sit to Stand: 5: Supervision;From chair/3-in-1;With upper extremity assist;With armrests Stand to Sit: 4: Min guard;To bed;To chair/3-in-1;With upper extremity assist;With armrests Stand Pivot Transfers: 4: Min guard Details for Transfer Assistance: pt stood first time from chair with supervision and cues for safe handplacement. stdg at walker for ther ex and pt reported feeling dizzy and needing to sit down. Min guard with sitting down and OT out of room to secure dinamap to check BP's. min  assist with second standing trial with no dizziness. During second stand it was noted that pt was having incr slurred speech and right facial aysmetry and pt returned to chair. Once seated pt stated "my arm feels funny" refering to her left arm. RN notifed and to room and informed of symptoms. MD called by RN in room. During this time pt transfered via a stand pivot to the bed. Once in bed pt's speech and facial symetry were noted to be improving. RN and MD continue to monitor.                      Exercises Total Joint Exercises Ankle Circles/Pumps: AROM;Strengthening;Both;10 reps;Seated (heel-toe raises.) Long Arc Quad: AROM;Strengthening;Right;10 reps;Seated Marching in Standing: AROM;Strengthening;Right;10 reps;Standing     PT Goals Acute Rehab PT Goals PT Goal: Supine/Side to Sit - Progress: Progressing toward goal PT Goal: Sit to Stand - Progress: Progressing toward goal PT Goal: Stand to Sit - Progress: Progressing toward goal Additional Goals PT Goal: Additional Goal #1 - Progress: Progressing toward goal  Visit Information  Last PT Received On: 08/26/12 Assistance Needed: +1 PT/OT Co-Evaluation/Treatment: Yes    Subjective Data  Subjective: Pt reporting feeling nauseated today, only able to eat a little at breakfast and lunch. Pt also reported feeling dizzy (see mobility section for details). No pain curently and agreeable to therapy today.   Cognition  Overall Cognitive Status: Appears within functional limits for tasks assessed/performed Arousal/Alertness: Awake/alert Orientation Level: Appears intact for tasks assessed Behavior During Session: [facility] for tasks performed       End of Session PT - End of Session Equipment Utilized During Treatment: Gait belt Activity Tolerance:  Treatment limited secondary to medical complications (Comment) Patient left: in bed;with call bell/phone within reach;with bed alarm set;with nursing in room;with family/visitor present Nurse  Communication: Mobility status;Other (comment) (new onset of symptoms with therapy)   GP     Sallyanne Kuster 08/26/2012, 2:14 PM  Sallyanne Kuster, PTA Office- [phone]

## 2012-08-27 DIAGNOSIS — R4701 Aphasia: Secondary | ICD-10-CM

## 2012-08-27 DIAGNOSIS — I951 Orthostatic hypotension: Secondary | ICD-10-CM

## 2012-08-27 LAB — PROTIME-INR
INR: 2.56 — ABNORMAL HIGH (ref 0.00–1.49)
Prothrombin Time: 26.3 seconds — ABNORMAL HIGH (ref 11.6–15.2)

## 2012-08-27 LAB — BASIC METABOLIC PANEL
CO2: 24 mEq/L (ref 19–32)
Calcium: 9.9 mg/dL (ref 8.4–10.5)
Chloride: 92 mEq/L — ABNORMAL LOW (ref 96–112)
Sodium: 128 mEq/L — ABNORMAL LOW (ref 135–145)

## 2012-08-27 MED ORDER — WARFARIN 0.5 MG HALF TABLET
0.5000 mg | ORAL_TABLET | Freq: Once | ORAL | Status: AC
  Administered 2012-08-27: 0.5 mg via ORAL
  Filled 2012-08-27: qty 1

## 2012-08-27 NOTE — Progress Notes (Signed)
Physical Therapy Treatment Patient Details Name: Madeline Serrano MRN: [id number] DOB: 05-23-1930 Today's Date: 08/27/2012 Time: 4098-1191 PT Time Calculation (min): 27 min  PT Assessment / Plan / Recommendation Comments on Treatment Session  Pt tolerated ambulation well today. Pt reported minimal dizziness with ambulation but this did not progress or become limiting.  Pt able to tolerate therapeutic exercises well with cood recall of technique.      Follow Up Recommendations  Home health PT;Supervision/Assistance - 24 hour     Does the patient have the potential to tolerate intense rehabilitation     Barriers to Discharge        Equipment Recommendations  Rolling walker with 5" wheels;3 in 1 bedside comode    Recommendations for Other Services    Frequency Min 6X/week   Plan Discharge plan remains appropriate;Frequency remains appropriate    Precautions / Restrictions Precautions Precautions: Posterior Hip;Fall Restrictions Weight Bearing Restrictions: Yes RLE Weight Bearing: Weight bearing as tolerated   Pertinent Vitals/Pain 3/10 pain in head; no other pain reported    Mobility  Bed Mobility Bed Mobility: Not assessed Transfers Transfers: Sit to Stand;Stand to Sit Sit to Stand: 5: Supervision;With upper extremity assist;With armrests;From chair/3-in-1 Stand to Sit: 5: Supervision;With upper extremity assist;With armrests;To chair/3-in-1 Details for Transfer Assistance: VC's for hand placement Ambulation/Gait Ambulation/Gait Assistance: 5: Supervision Ambulation Distance (Feet): 120 Feet Assistive device: Rolling walker Ambulation/Gait Assistance Details: VC's to take breaks secondary to minimal dizziness while ambulating Gait Pattern: Step-through pattern;Decreased stride length;Trunk flexed    Exercises Total Joint Exercises Ankle Circles/Pumps: AROM;Strengthening;Both;10 reps;Seated (heel-toe raises.) Long Arc Quad: AROM;Strengthening;Right;10 reps;Seated General  Exercises - Lower Extremity Hip Flexion/Marching: AROM;Strengthening;Right;10 reps;Seated     PT Goals Acute Rehab PT Goals PT Goal Formulation: With patient Time For Goal Achievement: 08/27/12 Potential to Achieve Goals: Good Pt will go Supine/Side to Sit: with modified independence;with HOB 0 degrees;Other (comment) (maintaining hip precautions) Pt will go Sit to Stand: with modified independence;with upper extremity assist PT Goal: Sit to Stand - Progress: Progressing toward goal Pt will go Stand to Sit: with modified independence;with upper extremity assist PT Goal: Stand to Sit - Progress: Progressing toward goal Pt will Ambulate: >150 feet;with supervision;with least restrictive assistive device;Other (comment) (no staggering loss of balance) PT Goal: Ambulate - Progress: Progressing toward goal  Visit Information  Last PT Received On: 08/27/12 Assistance Needed: +1 PT/OT Co-Evaluation/Treatment: Yes    Subjective Data  Subjective: Pt reports she was on inpt rehab, then [facility], and about to d/c home on Sunday 11/24 Patient Stated Goal: return home Sunday (vs return to [facility])   Cognition  Overall Cognitive Status: Appears within functional limits for tasks assessed/performed Arousal/Alertness: Awake/alert Orientation Level: Oriented X4 / Intact Behavior During Session: [facility] for tasks performed    Balance     End of Session PT - End of Session Equipment Utilized During Treatment: Gait belt Activity Tolerance: Patient tolerated treatment well Patient left: in chair;with call bell/phone within reach Nurse Communication: Mobility status   GP     Fabio Asa 08/27/2012, 12:07 PM Charlotte Crumb, PT DPT  [phone]

## 2012-08-27 NOTE — Consult Note (Signed)
Reason for Consult:Episodes of left facial droop and left upper extremity weakness Referring Physician: [name]  CC: Episodes of left facial droop and left upper extremity weakness  HPI: Madeline Serrano is an 78 y.o. female who recently underwent a hip arthroplasty and was in rehab when she was noted to have an episode of left facial droop and altered sensation in the left upper extremity.  Patient presented to the ED at that time and was admitted for a TIA work up.  Patient is on Coumadin on an outpatient basis.  INR was subtherapeutic at presentation.  Since that time Coumadin has been adjusted and as of yesterday the INR was therapeutic.  Patient is also on ASA and Plavix.  Patient has had multiple recurrent episodes since admission with the last being yesterday while working with therapy.  It seems on further conversation that all of her episodes have been while standing or after being up for a period of time.  With her last episode her BP was checked and was well below her usual blood pressures.  The patient also reports that she has a history of ocular migraines.  For the past week though she has had what she describes as visual scotomas that have occurred daily.  This is unusual for her.     Past Medical History  Diagnosis Date  . A-fib   . Hypertension   . High cholesterol   . Macular degeneration     Past Surgical History  Procedure Date  . Hip arthroplasty 06/30/2012    Procedure: ARTHROPLASTY BIPOLAR HIP;  Surgeon: Shelda Pal, MD;  Location: [facility] OR;  Service: Orthopedics;  Laterality: Right;  right hip hemiarthroplasty    History reviewed. No pertinent family history.  Social History:  reports that she has never smoked. She does not have any smokeless tobacco history on file. She reports that she does not drink alcohol or use illicit drugs.  Allergies  Allergen Reactions  . Sulfa Antibiotics Other (See Comments)    [name]  . Celebrex (Celecoxib) Swelling  .  Clarithromycin Swelling  . Statins Other (See Comments)    Hair fell out  . Vioxx (Rofecoxib) Other (See Comments)    Renal problems  . Penicillins Rash    Medications:  I have reviewed the patient's current medications. Scheduled:   . aspirin  81 mg Oral Daily  . carvedilol  25 mg Oral BID WC  . clopidogrel  75 mg Oral Q breakfast  . digoxin  0.125 mg Oral Daily  . docusate sodium  100 mg Oral BID  . flecainide  50 mg Oral BID  . iron polysaccharides  150 mg Oral Daily  . tiotropium  18 mcg Inhalation Daily  . triamterene-hydrochlorothiazide  1 each Oral Daily  . [COMPLETED] warfarin  0.5 mg Oral ONCE-1800  . Warfarin - Pharmacist Dosing Inpatient   Does not apply q1800    ROS: History obtained from the patient  General ROS: negative for - chills, fatigue, fever, night sweats, weight gain or weight loss Psychological ROS: negative for - behavioral disorder, hallucinations, memory difficulties, mood swings or suicidal ideation Ophthalmic ROS: negative for - blurry vision, double vision, eye pain or loss of vision ENT ROS: negative for - epistaxis, nasal discharge, oral lesions, sore throat, tinnitus or vertigo Allergy and Immunology ROS: negative for - hives or itchy/watery eyes Hematological and Lymphatic ROS: negative for - bleeding problems, bruising or swollen lymph nodes Endocrine ROS: negative for - galactorrhea, hair pattern  changes, polydipsia/polyuria or temperature intolerance Respiratory ROS: negative for - cough, hemoptysis, shortness of breath or wheezing Cardiovascular ROS: negative for - chest pain, dyspnea on exertion, edema or irregular heartbeat Gastrointestinal ROS: negative for - abdominal pain, diarrhea, hematemesis, nausea/vomiting or stool incontinence Genito-Urinary ROS: negative for - dysuria, hematuria, incontinence or urinary frequency/urgency Musculoskeletal ROS: joint pain and swelling Neurological ROS: as noted in HPI Dermatological ROS: negative  for rash and skin lesion changes  Physical Examination: Blood pressure 151/43, pulse 65, temperature 98.1 F (36.7 C), temperature source Oral, resp. rate 20, height 5\' 6"  (1.676 m), weight 78.4 kg (172 lb 13.5 oz), SpO2 99.00%.  Neurologic Examination Mental Status: Alert, oriented, thought content appropriate.  Speech fluent without evidence of aphasia.  Able to follow 3 step commands without difficulty. Cranial Nerves: II: Discs flat bilaterally; Visual fields grossly normal, pupils equal, round, reactive to light and accommodation III,IV, VI: ptosis not present, extra-ocular motions intact bilaterally V,VII: smile symmetric, facial light touch sensation normal bilaterally VIII: hearing normal bilaterally IX,X: gag reflex present XI: bilateral shoulder shrug XII: midline tongue extension Motor: Right : Upper extremity   5/5    Left:     Upper extremity   5/5  Lower extremity   5/5     Lower extremity   5/5 Tone and bulk:normal tone throughout; no atrophy noted.  Some limited ROM due to arthritis Sensory: Pinprick and light touch intact throughout, bilaterally Deep Tendon Reflexes: 2+ in the upper extremities and absent in the lower extremities. Plantars: Right: mute   Left: mute Cerebellar: normal finger-to-nose and normal heel-to-shin test Gait: not tested CV: pulses palpable throughout     Laboratory Studies:   Basic Metabolic Panel:  Lab 08/27/12 9604 08/26/12 0615 08/25/12 0230 08/24/12 1249  NA 128* 130* 130* 130*  K 3.8 3.6 3.3* 4.0  CL 92* 95* 96 93*  CO2 24 26 24 25   GLUCOSE 159* 114* 95 117*  BUN 9 9 10 13   CREATININE 1.07 1.03 0.97 1.04  CALCIUM 9.9 9.3 8.8 --  MG -- -- -- --  PHOS -- -- -- --    Liver Function Tests:  Lab 08/24/12 1249  AST 39*  ALT 25  ALKPHOS 109  BILITOT 0.5  PROT 7.0  ALBUMIN 3.5   No results found for this basename: LIPASE:5,AMYLASE:5 in the last 168 hours No results found for this basename: AMMONIA:3 in the last 168  hours  CBC:  Lab 08/26/12 0615 08/25/12 0230 08/24/12 1249  WBC 6.8 6.8 8.0  NEUTROABS -- -- 5.5  HGB 10.8* 10.5* 11.7*  HCT 33.2* 31.9* 34.9*  MCV 91.0 90.6 91.1  PLT 203 195 222    Cardiac Enzymes: No results found for this basename: CKTOTAL:5,CKMB:5,CKMBINDEX:5,TROPONINI:5 in the last 168 hours  BNP: No components found with this basename: POCBNP:5  CBG: No results found for this basename: GLUCAP:5 in the last 168 hours  Microbiology: Results for orders placed during the hospital encounter of 08/24/12  URINE CULTURE     Status: Normal   Collection Time   08/24/12  3:00 PM      Component Value Range Status Comment   Specimen Description URINE, CLEAN CATCH   Final    Special Requests NONE   Final    Culture  Setup Time 08/24/2012 16:41   Final    Colony Count 90,000 COLONIES/ML   Final    Culture     Final    Value: Multiple bacterial morphotypes present, none predominant. Suggest  appropriate recollection if clinically indicated.   Report Status 08/25/2012 FINAL   Final     Coagulation Studies:  Basename 08/27/12 0750 09-01-2012 0615 08/25/12 0230  LABPROT 26.3* 24.1* 20.0*  INR 2.56* 2.28* 1.77*    Urinalysis:  Lab 08/24/12 1500  COLORURINE YELLOW  LABSPEC 1.007  PHURINE 7.0  GLUCOSEU NEGATIVE  HGBUR NEGATIVE  BILIRUBINUR NEGATIVE  KETONESUR NEGATIVE  PROTEINUR NEGATIVE  UROBILINOGEN 0.2  NITRITE NEGATIVE  LEUKOCYTESUR LARGE*    Lipid Panel:     Component Value Date/Time   CHOL 161 September 01, 2012 0615   TRIG 265* 09/01/2012 0615   HDL 40 September 01, 2012 0615   CHOLHDL 4.0 01-Sep-2012 0615   VLDL 53* Sep 01, 2012 0615   LDLCALC 68 09-01-2012 0615    HgbA1C:  No results found for this basename: HGBA1C    Urine Drug Screen:   No results found for this basename: labopia, cocainscrnur, labbenz, amphetmu, thcu, labbarb    Alcohol Level: No results found for this basename: ETH:2 in the last 168 hours  Imaging: Ct Head Wo Contrast  2012-09-01  *RADIOLOGY  REPORT*  Clinical Data: Right facial droop, slurred speech and right-sided weakness.  CT HEAD WITHOUT CONTRAST  Technique:  Contiguous axial images were obtained from the base of the skull through the vertex without contrast.  Comparison: 08/24/2012  Findings: Stable cortical atrophy. The brain demonstrates no evidence of hemorrhage, infarction, edema, mass effect, extra-axial fluid collection, hydrocephalus or mass lesion.  The skull is unremarkable.  Chronic appearing opacification of the left maxillary antrum with associated bony sclerosis involving anterior and posterolateral walls of the left maxillary sinus.  IMPRESSION: Stable atrophy with no acute intracranial findings.  Chronic appearing left maxillary sinusitis.   Original Report Authenticated By: Irish Lack, M.D.    Mr [facility] Wo Contrast  09/01/12  *RADIOLOGY REPORT*  Clinical Data: 78 year old female with right facial droop and expressive aphasia.  Atrial fibrillation.  MRA HEAD WITHOUT CONTRAST  Technique: Angiographic images of the Circle of Willis were obtained using MRA technique without intravenous contrast.  Comparison: Brain MRI 08/24/2012.  Findings: Dominant distal right vertebral artery with antegrade flow.  Diminutive right PICA is patent.  The right vertebral artery supplies the basilar artery which is patent without stenosis.  Diminutive distal left vertebral artery occludes proximal to the vertebrobasilar junction.  Left PICA not definitely identified.  Basilar tip is within normal limits.  SCA and PCA origins are normal.  Posterior communicating arteries are diminutive or absent. Bilateral PCA branches are within normal limits.  Antegrade flow in both ICA siphons.  Bilateral supraclinoid ICA irregularity.  Mild bilateral supraclinoid ICA stenosis.  Patent carotid termini.  MCA origins are within normal limits. Dominant right ACA A1 segment.  Normal anterior communicating artery.  Visualized ACA branches are within normal  limits.  Normal right MCA M1 segment and visualized right MCA branches.  The left MCA is diminutive and irregular suggesting atherosclerosis. The left MCA bifurcation is patent.  The left MCA M2 and more distal visualized branches appear more normal and more symmetric compared to those on the right.  No left MCA branch occlusion identified.  IMPRESSION: 1.  Atherosclerotic nondominant distal left vertebral artery occludes proximal to the basilar artery. 2.  The basilar artery is therefore supplied by the right vertebral and is patent without stenosis. 3.  The remaining posterior circulation is within normal limit. 4.  Bilateral supraclinoid ICA atherosclerosis and mild stenosis. Patent carotid termini. 5.  Atherosclerotic and diminutive left MCA M1  segment, but no major MCA branch occlusion or focal high-grade stenosis identified. 6. Remaining anterior circulation within normal limits.   Original Report Authenticated By: Erskine Speed, M.D.      Assessment/Plan: 78 year old female with multiple episodes of left facial droop and left upper extremity weakness/numbness.  Work up has included an MRI of the brain that showed no acute changes.  MRA showed some atherosclerotic disease but nothing that would be addressable by anything other than medical management.  After review of the episodes it seems that her spells may very well be related to hypotension superimposed on areas of atherosclerotic disease leading to focal neurologic hypoperfusion.    Recommendations: 1.  Would consider a change in antihypertensive doses and/or medications.  Goal is to prevent BP lability, particularly the lower pressures, and maintain a good perfusion pressure with mildly elevated systolic BP. 2.  Follow orthostatics 3.  Now that adequately anticoagulated would consider simplifying antiplatelet therapy-concerned about risk of bleeding. 4.  Continue therapy   Thana Farr, MD [facility] [phone] 08/27/2012, 10:26 PM

## 2012-08-27 NOTE — Progress Notes (Signed)
Subjective: Events of yesterday noted with transient slurred speech and right facial asymmetry. She continues to have brief periods of lines in her visual field.  Objective: Vital signs in last 24 hours: Temp:  [97.5 F (36.4 C)-98.1 F (36.7 C)] 98.1 F (36.7 C) (11/24 0645) Pulse Rate:  [63-72] 69  (11/24 0645) Resp:  [20] 20  (11/24 0645) BP: (111-192)/(41-66) 184/65 mmHg (11/24 0645) SpO2:  [98 %-100 %] 98 % (11/24 0737) Weight:  [78.4 kg (172 lb 13.5 oz)] 78.4 kg (172 lb 13.5 oz) (11/24 0645) Weight change:    Intake/Output from previous day:     General appearance: alert, cooperative and no distress Resp: clear to auscultation bilaterally Cardio: regular rate and rhythm Neurologic: Mental status: Alert, oriented, thought content appropriate Cranial nerves: normal Motor: grossly normal  Lab Results:  Basename 08/26/12 0615 08/25/12 0230  WBC 6.8 6.8  HGB 10.8* 10.5*  HCT 33.2* 31.9*  PLT 203 195   BMET  Basename 08/27/12 0750 08/26/12 0615  NA 128* 130*  K 3.8 3.6  CL 92* 95*  CO2 24 26  GLUCOSE 159* 114*  BUN 9 9  CREATININE 1.07 1.03  CALCIUM 9.9 9.3   CMET CMP     Component Value Date/Time   NA 128* 08/27/2012 0750   K 3.8 08/27/2012 0750   CL 92* 08/27/2012 0750   CO2 24 08/27/2012 0750   GLUCOSE 159* 08/27/2012 0750   BUN 9 08/27/2012 0750   CREATININE 1.07 08/27/2012 0750   CALCIUM 9.9 08/27/2012 0750   PROT 7.0 08/24/2012 1249   ALBUMIN 3.5 08/24/2012 1249   AST 39* 08/24/2012 1249   ALT 25 08/24/2012 1249   ALKPHOS 109 08/24/2012 1249   BILITOT 0.5 08/24/2012 1249   GFRNONAA 48* 08/27/2012 0750   GFRAA 55* 08/27/2012 0750    CBG (last 3)  No results found for this basename: GLUCAP:3 in the last 72 hours  INR RESULTS:   Lab Results  Component Value Date   INR 2.56* 08/27/2012   INR 2.28* 08/26/2012   INR 1.77* 08/25/2012     Studies/Results: Ct Head Wo Contrast  08/26/2012  *RADIOLOGY REPORT*  Clinical Data: Right facial  droop, slurred speech and right-sided weakness.  CT HEAD WITHOUT CONTRAST  Technique:  Contiguous axial images were obtained from the base of the skull through the vertex without contrast.  Comparison: 08/24/2012  Findings: Stable cortical atrophy. The brain demonstrates no evidence of hemorrhage, infarction, edema, mass effect, extra-axial fluid collection, hydrocephalus or mass lesion.  The skull is unremarkable.  Chronic appearing opacification of the left maxillary antrum with associated bony sclerosis involving anterior and posterolateral walls of the left maxillary sinus.  IMPRESSION: Stable atrophy with no acute intracranial findings.  Chronic appearing left maxillary sinusitis.   Original Report Authenticated By: Irish Lack, M.D.    Mr [facility] Wo Contrast  08/26/2012  *RADIOLOGY REPORT*  Clinical Data: 78 year old female with right facial droop and expressive aphasia.  Atrial fibrillation.  MRA HEAD WITHOUT CONTRAST  Technique: Angiographic images of the Circle of Willis were obtained using MRA technique without intravenous contrast.  Comparison: Brain MRI 08/24/2012.  Findings: Dominant distal right vertebral artery with antegrade flow.  Diminutive right PICA is patent.  The right vertebral artery supplies the basilar artery which is patent without stenosis.  Diminutive distal left vertebral artery occludes proximal to the vertebrobasilar junction.  Left PICA not definitely identified.  Basilar tip is within normal limits.  SCA and PCA origins are  normal.  Posterior communicating arteries are diminutive or absent. Bilateral PCA branches are within normal limits.  Antegrade flow in both ICA siphons.  Bilateral supraclinoid ICA irregularity.  Mild bilateral supraclinoid ICA stenosis.  Patent carotid termini.  MCA origins are within normal limits. Dominant right ACA A1 segment.  Normal anterior communicating artery.  Visualized ACA branches are within normal limits.  Normal right MCA M1 segment and  visualized right MCA branches.  The left MCA is diminutive and irregular suggesting atherosclerosis. The left MCA bifurcation is patent.  The left MCA M2 and more distal visualized branches appear more normal and more symmetric compared to those on the right.  No left MCA branch occlusion identified.  IMPRESSION: 1.  Atherosclerotic nondominant distal left vertebral artery occludes proximal to the basilar artery. 2.  The basilar artery is therefore supplied by the right vertebral and is patent without stenosis. 3.  The remaining posterior circulation is within normal limit. 4.  Bilateral supraclinoid ICA atherosclerosis and mild stenosis. Patent carotid termini. 5.  Atherosclerotic and diminutive left MCA M1 segment, but no major MCA branch occlusion or focal high-grade stenosis identified. 6. Remaining anterior circulation within normal limits.   Original Report Authenticated By: Erskine Speed, M.D.     Medications: I have reviewed the patient's current medications.  Assessment/Plan: #1 TIAs:  sxs continue despite adequate anticoagulation and the addition of plavix to coumadin. Brain MRA shows occlusion of right vertebral artery and branch vessel disease in the left MCA. Sxs could be due to transient hypotension and hypoperfusion. Will request neurologist consultation and check orthostatic blood pressures today. She will likely be discharged to home tomorrow. #2 HTN: supine BP is high and will check standing BPs first before adjusting BP meds.   LOS: 3 days   Herbert Aguinaldo G 08/27/2012, 11:17 AM

## 2012-08-27 NOTE — Progress Notes (Signed)
ANTICOAGULATION CONSULT NOTE - Follow Up Consult  Pharmacy Consult for Coumadin Indication: atrial fibrillation and new TIA  Allergies  Allergen Reactions  . Sulfa Antibiotics Other (See Comments)    [name]  . Celebrex (Celecoxib) Swelling  . Clarithromycin Swelling  . Statins Other (See Comments)    Hair fell out  . Vioxx (Rofecoxib) Other (See Comments)    Renal problems  . Penicillins Rash    Patient Measurements: Height: 5\' 6"  (167.6 cm) Weight: 172 lb 13.5 oz (78.4 kg) IBW/kg (Calculated) : 59.3  Heparin Dosing Weight: 77 kg  Vital Signs: Temp: 97.5 F (36.4 C) (11/24 1115) Temp src: Oral (11/24 1115) BP: 132/61 mmHg (11/24 1115) Pulse Rate: 65  (11/24 1115)  Labs:  Basename 08/27/12 0750 08/26/12 0615 08/25/12 1138 08/25/12 0230  HGB -- 10.8* -- 10.5*  HCT -- 33.2* -- 31.9*  PLT -- 203 -- 195  APTT -- -- -- --  LABPROT 26.3* 24.1* -- 20.0*  INR 2.56* 2.28* -- 1.77*  HEPARINUNFRC -- 0.36 0.36 0.14*  CREATININE 1.07 1.03 -- 0.97  CKTOTAL -- -- -- --  CKMB -- -- -- --  TROPONINI -- -- -- --    Estimated Creatinine Clearance: 44.3 ml/min (by C-G formula based on Cr of 1.07).  Assessment:     INR in goal range   Home Coumadin regimen: 2 mg Tues/Wed/Thurs/Sat - none on Sun/Mon/Fri.  Unusual regimen. Will likely need daily dosing going forward.    Goal of Therapy:  INR 2-3  Monitor platelets by anticoagulation protocol: Yes   Plan:   Coumadin 0.5 mg today.  Continue daily PT/INR  Woodfin Ganja, PharmD Pager: [phone] 08/27/2012,1:59 PM

## 2012-08-28 LAB — BASIC METABOLIC PANEL
CO2: 25 mEq/L (ref 19–32)
Calcium: 9.9 mg/dL (ref 8.4–10.5)
Chloride: 92 mEq/L — ABNORMAL LOW (ref 96–112)
Creatinine, Ser: 1.22 mg/dL — ABNORMAL HIGH (ref 0.50–1.10)
Glucose, Bld: 138 mg/dL — ABNORMAL HIGH (ref 70–99)

## 2012-08-28 MED ORDER — ACETAMINOPHEN 325 MG PO TABS
650.0000 mg | ORAL_TABLET | Freq: Four times a day (QID) | ORAL | Status: DC | PRN
  Administered 2012-08-28 – 2012-08-31 (×3): 650 mg via ORAL
  Filled 2012-08-28 (×2): qty 2

## 2012-08-28 MED ORDER — SODIUM CHLORIDE 0.9 % IV SOLN
250.0000 mL | INTRAVENOUS | Status: DC | PRN
  Administered 2012-08-28 – 2012-08-30 (×2): 250 mL via INTRAVENOUS

## 2012-08-28 NOTE — Progress Notes (Signed)
Madeline Serrano will follow for [facility]. Isidoro Donning RN CCM Case Mgmt phone [phone]

## 2012-08-28 NOTE — Progress Notes (Signed)
Physical Therapy Treatment Patient Details Name: Madeline Serrano MRN: [id number] DOB: Jun 09, 1930 Today's Date: 08/28/2012 Time: 0902-0920 PT Time Calculation (min): 18 min  PT Assessment / Plan / Recommendation Comments on Treatment Session  Pt's BP stable from supine to sit, however with significant drop in both SBP and DBP upon standing with + nausea. Interestingly, pt reports she wore TED hose from time of surgery until about 2 weeks ago when MD discontinued them. She does not think she could don them herself at home (due to arthritis in hands).    Follow Up Recommendations  Home health PT;Supervision/Assistance - 24 hour     Does the patient have the potential to tolerate intense rehabilitation     Barriers to Discharge        Equipment Recommendations  Rolling walker with 5" wheels;3 in 1 bedside comode    Recommendations for Other Services    Frequency Min 5X/week   Plan Discharge plan remains appropriate;Frequency needs to be updated    Precautions / Restrictions Precautions Precautions: Posterior Hip;Fall Precaution Comments: Pt able to verbalize and demonstrate adherence to hip precautions Restrictions RLE Weight Bearing: Weight bearing as tolerated Other Position/Activity Restrictions: orthostatic hypotension   Pertinent Vitals/Pain BP supine 159/61 (84)  HR 67 BP sitting 146/63 (86)  HR 69 BP standing 126/41 (61) HR 70 pt denied dizziness, + nausea BP sitting 136/77 (92) HR 69    Mobility  Bed Mobility Bed Mobility: Supine to Sit;Sitting - Scoot to Edge of Bed Supine to Sit: 6: Modified independent (Device/Increase time);HOB flat Sitting - Scoot to Edge of Bed: 6: Modified independent (Device/Increase time) Details for Bed Mobility Assistance: no cues needed to maintain hip precautions Transfers Transfers: Sit to Stand;Stand to Sit Sit to Stand: 5: Supervision;With upper extremity assist;From bed Stand to Sit: 6: Modified independent (Device/Increase time);With  upper extremity assist;With armrests;To chair/3-in-1 Details for Transfer Assistance: supervision due to orthostasis; denied dizziness, + nausea Ambulation/Gait Ambulation/Gait Assistance: 5: Supervision Ambulation Distance (Feet): 12 Feet Assistive device: Rolling walker Ambulation/Gait Assistance Details: limited ambulation due to orthostasis and increasing nausea; ambulated around bed to chair Gait Pattern: Step-through pattern;Decreased stride length;Trunk flexed    Exercises     PT Diagnosis:    PT Problem List:   PT Treatment Interventions:     PT Goals Acute Rehab PT Goals Pt will go Supine/Side to Sit: with modified independence;with HOB 0 degrees;Other (comment) PT Goal: Supine/Side to Sit - Progress: Met Pt will go Sit to Stand: with modified independence;with upper extremity assist PT Goal: Sit to Stand - Progress: Progressing toward goal Pt will go Stand to Sit: with modified independence;with upper extremity assist PT Goal: Stand to Sit - Progress: Met Pt will Ambulate: >150 feet;with supervision;with least restrictive assistive device;Other (comment) PT Goal: Ambulate - Progress: Not progressing Additional Goals Additional Goal #1: Pt will verbalize and adhere to hip precautions while mobilizing PT Goal: Additional Goal #1 - Progress: Partly met (would like to see continued consistency)  Visit Information  Last PT Received On: 08/28/12 Assistance Needed: +1    Subjective Data  Subjective: Pt reports she got dizzy when she was up yesterday Patient Stated Goal: return home with son's assist   Cognition  Overall Cognitive Status: Appears within functional limits for tasks assessed/performed Arousal/Alertness: Awake/alert Orientation Level: Oriented X4 / Intact Behavior During Session: [facility] for tasks performed    Balance     End of Session PT - End of Session Activity Tolerance: Treatment limited secondary to  medical complications (Comment) (nausea;  orthostasis) Patient left: in chair;with call bell/phone within reach Nurse Communication: Mobility status;Other (comment) (orthostatic hypotension)   GP     Tremayne Sheldon 08/28/2012, 9:40 AM Pager [phone]

## 2012-08-28 NOTE — Progress Notes (Signed)
1100 AM Pt was sitting up in the chair at bedside. Pt became light-headed, nauseous, dizzy, and was complaining of visual changes. The pt was laid back in the bed, and she felt her symptoms quickly resolve. Pt B/P in the bed was 161/61. Pt positive for orthostatic hypotension this am. Pt stable and feeling better. Will continue to monitor.

## 2012-08-28 NOTE — Progress Notes (Signed)
OT Cancellation Note  Patient Details Name: Madeline Serrano MRN: [id number] DOB: April 22, 1930   Cancelled Treatment:    Reason Eval/Treat Not Completed: Medical issues which prohibited therapy (orthostatic. symptomatic sitting in chair) will return this pm if able.  [facility] Micheal Sheen, OTR/L  [phone] 08/28/2012 08/28/2012, 12:21 PM

## 2012-08-28 NOTE — Progress Notes (Signed)
2035-Pt walked to BR with walker and RNs supervision, pt suddenly got light headed/dizzy. RN catch pt and sat her on commode, pt stated that it was scary and its her first time to feel this worst that she almost fell. Pt assisted back in bed and stated that her vision is bothering her(seeing floaters) intermittently. VS checked BP145/63, P73, RR21. Will continue to monitor.

## 2012-08-28 NOTE — Progress Notes (Signed)
ANTICOAGULATION CONSULT NOTE - Follow Up Consult  Pharmacy Consult for Coumadin Indication: atrial fibrillation and new TIA  Allergies  Allergen Reactions  . Sulfa Antibiotics Other (See Comments)    [name]  . Celebrex (Celecoxib) Swelling  . Clarithromycin Swelling  . Statins Other (See Comments)    Hair fell out  . Vioxx (Rofecoxib) Other (See Comments)    Renal problems  . Penicillins Rash    Patient Measurements: Height: 5\' 6"  (167.6 cm) Weight: 173 lb 1 oz (78.5 kg) IBW/kg (Calculated) : 59.3  Heparin Dosing Weight: 77 kg  Vital Signs: Temp: 97.3 F (36.3 C) (11/25 0607) Temp src: Oral (11/25 0607) BP: 136/77 mmHg (11/25 0915) Pulse Rate: 69  (11/25 0915)  Labs:  Basename 08/28/12 0735 08/27/12 0750 08/26/12 0615 08/25/12 1138  HGB -- -- 10.8* --  HCT -- -- 33.2* --  PLT -- -- 203 --  APTT -- -- -- --  LABPROT 27.7* 26.3* 24.1* --  INR 2.75* 2.56* 2.28* --  HEPARINUNFRC -- -- 0.36 0.36  CREATININE 1.22* 1.07 1.03 --  CKTOTAL -- -- -- --  CKMB -- -- -- --  TROPONINI -- -- -- --    Estimated Creatinine Clearance: 38.9 ml/min (by C-G formula based on Cr of 1.22).  Assessment: 78 y.o. F who [facility] with diffuse headache and photophobia, L facial droop, and altered senstation in LUE to r/o TIA. Pt is s/p recent R hip hemiarthroplasty in October and has been residing at [facility] for rehab. The patient has a history of Afib and presented with a SUBtherapeutic INR of 1.58 on warfarin.   PTA the patient's NH report stated she was taking 2 mg only on Tues/Wed/Thurs/Sat -- no warfarin was being given on Sun/Mon/Fri.  Anticoagulation - hx afib, Coumadin pta, INR 2.75 today  Cardiovascular - HTN, HLD, hx PAF. ASA 81, Coreg, Dig 0.125, Flec 50 BID, Maxzide as pta. TEE negative. TIA symptoms likely realted to orthostasis/labile BP.  Endocrinology - no DM  Gastrointestinal / Nutrition - off home PPI and osteoporosis meds Neurology - Trazdone  qhs prn, hip surg in Oct d/t fem neck fx. No CVA on MRI or CT Nephrology - hx ARF, but Scr 0.1.22 (up).  Pulmonary -hx COPD. Spiriva,  Hematology / Oncology - H/H down from baseline but OK, PLTC 203 PTA Medication Issues: Oscal D, Miacalcin, PremPro, MVI, Prevacid. Crestor 5 QOD Best Practices: /coumadin  Goal of Therapy:  INR 2-3  Monitor platelets by anticoagulation protocol: Yes   Plan:  Hold Coumadin x 1 tonight due to rising INR  Continue daily PT/INR  Pasty Spillers, PharmD Pager: [phone] 08/28/2012,10:44 AM

## 2012-08-28 NOTE — Progress Notes (Signed)
Subjective: No new issues.  She has not had any further TIA symptoms in >36 hours.  BP drops when she stands up.  Feels a little dizzy with that.    Objective: Vital signs in last 24 hours: Temp:  [97.3 F (36.3 C)-98.1 F (36.7 C)] 97.3 F (36.3 C) (11/25 0607) Pulse Rate:  [64-70] 64  (11/25 0607) Resp:  [20] 20  (11/25 0607) BP: (129-187)/(43-71) 159/63 mmHg (11/25 0607) SpO2:  [98 %-100 %] 99 % (11/25 0607) Weight:  [78.5 kg (173 lb 1 oz)] 78.5 kg (173 lb 1 oz) (11/25 1610) Weight change: 0.1 kg (3.5 oz) Last BM Date: 08/24/12  CBG (last 3)  No results found for this basename: GLUCAP:3 in the last 72 hours  Intake/Output from previous day:   Intake/Output this shift:    General appearance: alert and no distress Eyes: no scleral icterus Throat: oropharynx moist without erythema Resp: clear to auscultation bilaterally Cardio: regular rate and rhythm GI: soft, non-tender; bowel sounds normal; no masses,  no organomegaly Extremities: no clubbing, cyanosis or edema Neurologic: no focal changes   Lab Results:  Basename 08/27/12 0750 08/26/12 0615  NA 128* 130*  K 3.8 3.6  CL 92* 95*  CO2 24 26  GLUCOSE 159* 114*  BUN 9 9  CREATININE 1.07 1.03  CALCIUM 9.9 9.3  MG -- --  PHOS -- --   No results found for this basename: AST:2,ALT:2,ALKPHOS:2,BILITOT:2,PROT:2,ALBUMIN:2 in the last 72 hours  Basename 08/26/12 0615  WBC 6.8  NEUTROABS --  HGB 10.8*  HCT 33.2*  MCV 91.0  PLT 203   Lab Results  Component Value Date   INR 2.56* 08/27/2012   INR 2.28* 08/26/2012   INR 1.77* 08/25/2012   No results found for this basename: CKTOTAL:3,CKMB:3,CKMBINDEX:3,TROPONINI:3 in the last 72 hours No results found for this basename: TSH,T4TOTAL,FREET3,T3FREE,THYROIDAB in the last 72 hours No results found for this basename: VITAMINB12:2,FOLATE:2,FERRITIN:2,TIBC:2,IRON:2,RETICCTPCT:2 in the last 72 hours  Studies/Results: Ct Head Wo Contrast  08/26/2012  *RADIOLOGY  REPORT*  Clinical Data: Right facial droop, slurred speech and right-sided weakness.  CT HEAD WITHOUT CONTRAST  Technique:  Contiguous axial images were obtained from the base of the skull through the vertex without contrast.  Comparison: 08/24/2012  Findings: Stable cortical atrophy. The brain demonstrates no evidence of hemorrhage, infarction, edema, mass effect, extra-axial fluid collection, hydrocephalus or mass lesion.  The skull is unremarkable.  Chronic appearing opacification of the left maxillary antrum with associated bony sclerosis involving anterior and posterolateral walls of the left maxillary sinus.  IMPRESSION: Stable atrophy with no acute intracranial findings.  Chronic appearing left maxillary sinusitis.   Original Report Authenticated By: Irish Lack, M.D.    Mr [facility] Wo Contrast  08/26/2012  *RADIOLOGY REPORT*  Clinical Data: 78 year old female with right facial droop and expressive aphasia.  Atrial fibrillation.  MRA HEAD WITHOUT CONTRAST  Technique: Angiographic images of the Circle of Willis were obtained using MRA technique without intravenous contrast.  Comparison: Brain MRI 08/24/2012.  Findings: Dominant distal right vertebral artery with antegrade flow.  Diminutive right PICA is patent.  The right vertebral artery supplies the basilar artery which is patent without stenosis.  Diminutive distal left vertebral artery occludes proximal to the vertebrobasilar junction.  Left PICA not definitely identified.  Basilar tip is within normal limits.  SCA and PCA origins are normal.  Posterior communicating arteries are diminutive or absent. Bilateral PCA branches are within normal limits.  Antegrade flow in both ICA siphons.  Bilateral  supraclinoid ICA irregularity.  Mild bilateral supraclinoid ICA stenosis.  Patent carotid termini.  MCA origins are within normal limits. Dominant right ACA A1 segment.  Normal anterior communicating artery.  Visualized ACA branches are within normal  limits.  Normal right MCA M1 segment and visualized right MCA branches.  The left MCA is diminutive and irregular suggesting atherosclerosis. The left MCA bifurcation is patent.  The left MCA M2 and more distal visualized branches appear more normal and more symmetric compared to those on the right.  No left MCA branch occlusion identified.  IMPRESSION: 1.  Atherosclerotic nondominant distal left vertebral artery occludes proximal to the basilar artery. 2.  The basilar artery is therefore supplied by the right vertebral and is patent without stenosis. 3.  The remaining posterior circulation is within normal limit. 4.  Bilateral supraclinoid ICA atherosclerosis and mild stenosis. Patent carotid termini. 5.  Atherosclerotic and diminutive left MCA M1 segment, but no major MCA branch occlusion or focal high-grade stenosis identified. 6. Remaining anterior circulation within normal limits.   Original Report Authenticated By: Erskine Speed, M.D.      Medications: Scheduled:   . aspirin  81 mg Oral Daily  . carvedilol  25 mg Oral BID WC  . clopidogrel  75 mg Oral Q breakfast  . digoxin  0.125 mg Oral Daily  . docusate sodium  100 mg Oral BID  . flecainide  50 mg Oral BID  . iron polysaccharides  150 mg Oral Daily  . tiotropium  18 mcg Inhalation Daily  . triamterene-hydrochlorothiazide  1 each Oral Daily  . [COMPLETED] warfarin  0.5 mg Oral ONCE-1800  . Warfarin - Pharmacist Dosing Inpatient   Does not apply q1800   Continuous:   Assessment/Plan: Principal Problem: 1. *TIA (transient ischemic attack)- recurrent TIAs are likely secondary to orthostasis/labile BP.  Secondary workup unrevealing.  BP dropped from 187/64 lying -->129/47 standing.  Avapro discontinued.    D/C Plavix due to bleeding risk with Coumadin and continue ASA.  Appreciate Neurology assistance.  Active Problems: 2. Atrial fibrillation- remains in NSR.  Continue Fleicanide, carvedilol, Digoxin, Coumadin. 3.  Hypertension/Orthostasis- encouraged fluid intake.  Check orthostatics qshift off Avapro.  Will only treat standing BPs with more liberal BP target. 4. Hyponatremia- secondary to Maxzide.  Monitor.  Efforts to discontinue Maxzide have resulted in significant edema in the past but may need to switch to alternative if orthostasis and hyponatremia persist. 5. COPD (chronic obstructive pulmonary disease)- continue Spiriva. Stable. 6. Hip fracture, right- continue PT/OT.  7. Disposition- anticipate discharge tomorrow if BP stable without recurrent TIA symptoms.       LOS: 4 days   Tressie Ragin,W DOUGLAS 08/28/2012, 7:37 AM

## 2012-08-29 LAB — BASIC METABOLIC PANEL
BUN: 13 mg/dL (ref 6–23)
Chloride: 94 mEq/L — ABNORMAL LOW (ref 96–112)
GFR calc Af Amer: 49 mL/min — ABNORMAL LOW (ref >90)
GFR calc non Af Amer: 42 mL/min — ABNORMAL LOW (ref >90)
Glucose, Bld: 113 mg/dL — ABNORMAL HIGH (ref 70–99)
Potassium: 4 mEq/L (ref 3.5–5.1)
Sodium: 128 mEq/L — ABNORMAL LOW (ref 135–145)

## 2012-08-29 LAB — CBC
HCT: 36 % (ref 36.0–46.0)
MCV: 90.9 fL (ref 78.0–100.0)
Platelets: 235 10*3/uL (ref 150–400)
RBC: 3.96 MIL/uL (ref 3.87–5.11)
RDW: 14.7 % (ref 11.5–15.5)
WBC: 7 10*3/uL (ref 4.0–10.5)

## 2012-08-29 LAB — PROTIME-INR: INR: 2.71 — ABNORMAL HIGH (ref 0.00–1.49)

## 2012-08-29 MED ORDER — WARFARIN SODIUM 2 MG PO TABS
2.0000 mg | ORAL_TABLET | Freq: Once | ORAL | Status: AC
  Administered 2012-08-29: 2 mg via ORAL
  Filled 2012-08-29: qty 1

## 2012-08-29 NOTE — Progress Notes (Signed)
ANTICOAGULATION CONSULT NOTE - Follow Up Consult  Pharmacy Consult for Coumadin Indication: atrial fibrillation and new TIA  Allergies  Allergen Reactions  . Sulfa Antibiotics Other (See Comments)    [name]  . Celebrex (Celecoxib) Swelling  . Clarithromycin Swelling  . Statins Other (See Comments)    Hair fell out  . Vioxx (Rofecoxib) Other (See Comments)    Renal problems  . Penicillins Rash    Patient Measurements: Height: 5\' 6"  (167.6 cm) Weight: 173 lb 1 oz (78.5 kg) IBW/kg (Calculated) : 59.3  Heparin Dosing Weight: 77 kg  Vital Signs: Temp: 97.9 F (36.6 C) (11/26 0933) Temp src: Oral (11/26 0544) BP: 143/57 mmHg (11/26 0933) Pulse Rate: 65  (11/26 0933)  Labs:  Basename 08/29/12 0619 08/28/12 0735 08/27/12 0750  HGB 12.1 -- --  HCT 36.0 -- --  PLT 235 -- --  APTT -- -- --  LABPROT 27.4* 27.7* 26.3*  INR 2.71* 2.75* 2.56*  HEPARINUNFRC -- -- --  CREATININE 1.19* 1.22* 1.07  CKTOTAL -- -- --  CKMB -- -- --  TROPONINI -- -- --    Estimated Creatinine Clearance: 39.9 ml/min (by C-G formula based on Cr of 1.19).  Assessment: 78 y.o. F who presented to the [facility] with diffuse headache and photophobia, L facial droop, and altered senstation in LUE to r/o TIA. Pt is s/p recent R hip hemiarthroplasty in October and has been residing at [facility] for rehab. The patient has a history of Afib and presented with a SUBtherapeutic INR of 1.58 on warfarin.   PTA the patient's NH report stated she was taking 2 mg only on Tues/Wed/Thurs/Sat -- no warfarin was being given on Sun/Mon/Fri. (Admit INR 1.58)  Anticoagulation - hx afib, Coumadin pta, INR 2.71 today. CBC ok.  Cardiovascular - HTN, HLD, hx PAF. ASA 81, Coreg, Dig 0.125, Flec 50 BID,  TEE negative. TIA symptoms likely realted to orthostasis/labile BP. 143/57, HR 65.  Endocrinology - no DM  Gastrointestinal / Nutrition - off home PPI and osteoporosis meds Neurology - Trazdone qhs prn, hip surg in Oct  d/t fem neck fx. No CVA on MRI or CT Nephrology - hx ARF, but Scr 1.19  Pulmonary -hx COPD. Spiriva,  Hematology / Oncology - H/H down from baseline but OK, PLTC 203 PTA Medication Issues: Oscal D, Miacalcin, PremPro, MVI, Prevacid. Crestor 5 QOD Best Practices: /coumadin  Goal of Therapy:  INR 2-3  Monitor platelets by anticoagulation protocol: Yes  Plan:  Coumadin 2mg  po x 1 today (as per home dose). Could try maintenance dose of 2mg  2mg  T,W,Th,Sat and 1mg  M,F ,Sun Continue daily PT/INR  Pasty Spillers, PharmD Pager: [phone] 08/29/2012,11:24 AM

## 2012-08-29 NOTE — Progress Notes (Signed)
Subjective: Had another more severe spell last pm while ambulating to the bathroom where she felt very lightheaded, near syncopal and nearly fell.  Caught by Hydrologist and returned to bed where symptoms improved.  Very orthostatic this am on BP checks.  Objective: Vital signs in last 24 hours: Temp:  [97.2 F (36.2 C)-97.6 F (36.4 C)] 97.5 F (36.4 C) (11/26 0544) Pulse Rate:  [63-73] 66  (11/26 0627) Resp:  [18-21] 20  (11/26 0544) BP: (105-176)/(41-93) 105/49 mmHg (11/26 0627) SpO2:  [97 %-100 %] 100 % (11/26 0544) Weight change:  Last BM Date: 08/27/12  Orthostatic VS: Lying BP 176/60 P 63, Sitting BP 139/64 P 63, Standing BP 105/49 P 66  CBG (last 3)  No results found for this basename: GLUCAP:3 in the last 72 hours  Intake/Output from previous day: 11/25 0701 - 11/26 0700 In: 250 [I.V.:250] Out: -  Intake/Output this shift:    General appearance: alert and no distress Eyes: no scleral icterus Throat: oropharynx dry Resp: clear to auscultation bilaterally Cardio: regular rate and rhythm and grade 2/6 SEM GI: soft, non-tender; bowel sounds normal; no masses,  no organomegaly Extremities: no clubbing, cyanosis or edema Neuro: no focal deficits    Lab Results:  [facility] 08/29/12 0619 08/28/12 0735  NA 128* 130*  K 4.0 3.8  CL 94* 92*  CO2 24 25  GLUCOSE 113* 138*  BUN 13 12  CREATININE 1.19* 1.22*  CALCIUM 9.8 9.9  MG -- --  PHOS -- --   No results found for this basename: AST:2,ALT:2,ALKPHOS:2,BILITOT:2,PROT:2,ALBUMIN:2 in the last 72 hours  Basename 08/29/12 0619  WBC 7.0  NEUTROABS --  HGB 12.1  HCT 36.0  MCV 90.9  PLT 235   Lab Results  Component Value Date   INR 2.71* 08/29/2012   INR 2.75* 08/28/2012   INR 2.56* 08/27/2012   Digoxin Level 1.1  Studies/Results: No results found.   Medications: Scheduled:   . aspirin  81 mg Oral Daily  . carvedilol  25 mg Oral BID WC  . digoxin  0.125 mg Oral Daily  . docusate sodium  100 mg Oral BID   . flecainide  50 mg Oral BID  . iron polysaccharides  150 mg Oral Daily  . tiotropium  18 mcg Inhalation Daily  . Warfarin - Pharmacist Dosing Inpatient   Does not apply q1800  . [DISCONTINUED] triamterene-hydrochlorothiazide  1 each Oral Daily   Continuous:   Assessment/Plan: Principal Problem: 1. *TIA (transient ischemic attack)- recurrent near syncope and visual changes are most likely orthostasis given postural relationship.  No new neurologic changes.  Active Problems: 2. Orthostasis- Maxzide discontinued.  Started on IV fluids, TED hose.  Recheck orthostatics qshift to assess response.  Check cortisol (low suspicion of adrenal insufficiency).   3. Atrial fibrillation- continue Fleicanide, Digoxin (level ok), and Coumadin.  INR therapeutic. 4. Hypertension- high supine BPs but very low standing pressures.  All BP meds discontinued except Coreg. 5. COPD (chronic obstructive pulmonary disease)- continue Spiriva. 6. Disposition- she and son are apprehensive about discharge due to ongoing issues and high risk for hospitalization.  Will monitor with IV fluids and med changes and consider discharge in 1-2 days if improved.     LOS: 5 days   Augustine Leverette,W DOUGLAS 08/29/2012, 7:46 AM

## 2012-08-29 NOTE — Progress Notes (Signed)
Subjective: Patient reports that she had a bad day yesterday.  It seems that she was quite orthostatic yesterday and was symptomatic when up and around.  Maxide has been stopped today.  Objective: Current vital signs: BP 105/49  Pulse 66  Temp 97.5 F (36.4 C) (Oral)  Resp 20  Ht 5\' 6"  (1.676 m)  Wt 78.5 kg (173 lb 1 oz)  BMI 27.93 kg/m2  SpO2 95% Vital signs in last 24 hours: Temp:  [97.2 F (36.2 C)-97.6 F (36.4 C)] 97.5 F (36.4 C) (11/26 0544) Pulse Rate:  [63-73] 66  (11/26 0627) Resp:  [18-21] 20  (11/26 0544) BP: (105-176)/(41-77) 105/49 mmHg (11/26 0627) SpO2:  [95 %-100 %] 95 % (11/26 0836)  Intake/Output from previous day: 11/25 0701 - 11/26 0700 In: 250 [I.V.:250] Out: -  Intake/Output this shift: Total I/O In: 240 [P.O.:240] Out: -  Nutritional status: Cardiac  Neurologic Exam: Mental Status:  Alert, oriented, thought content appropriate. Speech fluent without evidence of aphasia. Able to follow 3 step commands without difficulty.  Cranial Nerves:  II: Discs flat bilaterally; Visual fields grossly normal, pupils equal, round, reactive to light and accommodation  III,IV, VI: ptosis not present, extra-ocular motions intact bilaterally  V,VII: smile symmetric, facial light touch sensation normal bilaterally  VIII: hearing normal bilaterally  IX,X: gag reflex present  XI: bilateral shoulder shrug  XII: midline tongue extension  Motor:  Right : Upper extremity 5/5 Left: Upper extremity 5/5  Lower extremity 5/5 Lower extremity 5/5  Tone and bulk:normal tone throughout; no atrophy noted. Some limited ROM due to arthritis  Sensory: Pinprick and light touch intact throughout, bilaterally  Deep Tendon Reflexes: 2+ in the upper extremities and absent in the lower extremities.  Plantars:  Right: mute Left: mute  Cerebellar:  normal finger-to-nose and normal heel-to-shin test  Gait: not tested  CV: pulses palpable throughout    Lab Results: Basic Metabolic  Panel:  Lab 08/29/12 0619 08/28/12 0735 08/27/12 0750 08/26/12 0615 08/25/12 0230  NA 128* 130* 128* 130* 130*  K 4.0 3.8 3.8 3.6 3.3*  CL 94* 92* 92* 95* 96  CO2 24 25 24 26 24   GLUCOSE 113* 138* 159* 114* 95  BUN 13 12 9 9 10   CREATININE 1.19* 1.22* 1.07 1.03 0.97  CALCIUM 9.8 9.9 9.9 -- --  MG -- -- -- -- --  PHOS -- -- -- -- --    Liver Function Tests:  Lab 08/24/12 1249  AST 39*  ALT 25  ALKPHOS 109  BILITOT 0.5  PROT 7.0  ALBUMIN 3.5   No results found for this basename: LIPASE:5,AMYLASE:5 in the last 168 hours No results found for this basename: AMMONIA:3 in the last 168 hours  CBC:  Lab 08/29/12 0619 08/26/12 0615 08/25/12 0230 08/24/12 1249  WBC 7.0 6.8 6.8 8.0  NEUTROABS -- -- -- 5.5  HGB 12.1 10.8* 10.5* 11.7*  HCT 36.0 33.2* 31.9* 34.9*  MCV 90.9 91.0 90.6 91.1  PLT 235 203 195 222    Cardiac Enzymes: No results found for this basename: CKTOTAL:5,CKMB:5,CKMBINDEX:5,TROPONINI:5 in the last 168 hours  Lipid Panel:  Lab 08/26/12 0615  CHOL 161  TRIG 265*  HDL 40  CHOLHDL 4.0  VLDL 53*  LDLCALC 68    CBG: No results found for this basename: GLUCAP:5 in the last 168 hours  Microbiology: Results for orders placed during the hospital encounter of 08/24/12  URINE CULTURE     Status: Normal   Collection Time  08/24/12  3:00 PM      Component Value Range Status Comment   Specimen Description URINE, CLEAN CATCH   Final    Special Requests NONE   Final    Culture  Setup Time 08/24/2012 16:41   Final    Colony Count 90,000 COLONIES/ML   Final    Culture     Final    Value: Multiple bacterial morphotypes present, none predominant. Suggest appropriate recollection if clinically indicated.   Report Status 08/25/2012 FINAL   Final     Coagulation Studies:  Basename 08/29/12 0619 2012/09/01 0735 08/27/12 0750  LABPROT 27.4* 27.7* 26.3*  INR 2.71* 2.75* 2.56*    Imaging: No results found.  Medications:  I have reviewed the patient's current  medications. Scheduled:   . aspirin  81 mg Oral Daily  . carvedilol  25 mg Oral BID WC  . digoxin  0.125 mg Oral Daily  . docusate sodium  100 mg Oral BID  . flecainide  50 mg Oral BID  . iron polysaccharides  150 mg Oral Daily  . tiotropium  18 mcg Inhalation Daily  . Warfarin - Pharmacist Dosing Inpatient   Does not apply q1800  . [DISCONTINUED] triamterene-hydrochlorothiazide  1 each Oral Daily    Assessment/Plan:  Patient Active Hospital Problem List:  Orthostasis (08/27/2012)   Assessment: Case discussed with Dr. Clelia Croft.  Antihypertensive therapy being addressed.  Patient has been placed in ted hose.  Fluids will be administered today as well.    Plan:  1.  Agree with current therapy.  Cortisol pending.      LOS: 5 days   Thana Farr, MD [facility] [phone] 08/29/2012  9:12 AM

## 2012-08-29 NOTE — Progress Notes (Signed)
Physical Therapy Treatment Patient Details Name: Madeline Serrano MRN: [id number] DOB: 01-21-1930 Today's Date: 08/29/2012 Time: 1478-2956 PT Time Calculation (min): 27 min  PT Assessment / Plan / Recommendation Comments on Treatment Session  Pt. presents on bedside comode upon PT arrival to room. Pt. BP had some fluctuations, but with frequent rest breaks, pt. able to ambulate in the hallway with min guard for safety and balance and someone following with recliner. Pt. encouraged to try and be sitting up in the chair for meals and to have RN/CNA with her when she is ready to return back to bed. Pt. stated agreement and reports that she will because she is too afraid that she will fall.    Follow Up Recommendations  Home health PT;Supervision/Assistance - 24 hour     Does the patient have the potential to tolerate intense rehabilitation     Barriers to Discharge        Equipment Recommendations  Rolling walker with 5" wheels;3 in 1 bedside comode    Recommendations for Other Services    Frequency Min 5X/week   Plan Discharge plan remains appropriate;Frequency needs to be updated    Precautions / Restrictions Precautions Precautions: Posterior Hip;Fall Precaution Comments: Pt able to verbalize and demonstrate adherence to hip precautions Restrictions Weight Bearing Restrictions: Yes RLE Weight Bearing: Weight bearing as tolerated Other Position/Activity Restrictions: Fluctuating BP   Pertinent Vitals/Pain Patient denies pain, just soreness in her groin that she has experienced since hip surgery.  BP sitting 134/53 BP standing after ~41minute 111/45; Pt. Notified SPTA that dizzy feeling had gone away and agreed to try ambulation. BP with ambulation 135/63 and 145/60 during rest breaks.    Mobility  Bed Mobility Bed Mobility: Not assessed Details for Bed Mobility Assistance: Pt. presents sitting on bed side commode  Transfers Transfers: Sit to Stand;Stand to Sit Sit to Stand: 4:  Min guard;With upper extremity assist;With armrests;From bed;From chair/3-in-1 Stand to Sit: 4: Min guard;With upper extremity assist;With armrests;To chair/3-in-1;To bed Details for Transfer Assistance: Pt. min guard with transfers this session secondary to pt. stating she has been feeling dizzy today and that she is too afraid that she will fall. Pt. given VC for proper hand placement when rising to RW. Ambulation/Gait Ambulation/Gait Assistance: 4: Min guard Ambulation Distance (Feet): 75 Feet Assistive device: Rolling walker Ambulation/Gait Assistance Details: Pt. ambulated in hallway min guard for safety and balance and frequent sitting rest breaks after pt. Reports of dizzy symptoms decreasing when sitting on EOB. Pt. BP recorded in vitals. Pt. stated that she had feelings of dizziness, lightheadedness and feeling "off kilter", but no vision problems and spinning feelings with todays session. Gait Pattern: Step-through pattern;Decreased stride length General Gait Details: pt. veryguarded with her gt. during session for pt. reportedfear of falling from dizziness. Stairs: No Wheelchair Mobility Wheelchair Mobility: No     PT Goals Acute Rehab PT Goals PT Goal Formulation: With patient Time For Goal Achievement: 08/27/12 Potential to Achieve Goals: Good Pt will go Supine/Side to Sit: with modified independence;with HOB 0 degrees;Other (comment) Pt will go Sit to Stand: with modified independence;with upper extremity assist PT Goal: Sit to Stand - Progress: Not met Pt will go Stand to Sit: with modified independence;with upper extremity assist PT Goal: Stand to Sit - Progress: Not met Pt will Ambulate: >150 feet;with supervision;with least restrictive assistive device;Other (comment) PT Goal: Ambulate - Progress: Progressing toward goal Additional Goals Additional Goal #1: Pt will verbalize and adhere to hip precautions while  mobilizing PT Goal: Additional Goal #1 - Progress:  Progressing toward goal  Visit Information  Last PT Received On: 08/29/12 Assistance Needed: +2 (For safety with fluctuating BP)    Subjective Data  Subjective: "I can't stand, because I get dizzy" Patient Stated Goal: return home with son's assist   Cognition  Overall Cognitive Status: Appears within functional limits for tasks assessed/performed Arousal/Alertness: Awake/alert Orientation Level: Oriented X4 / Intact Behavior During Session: [facility] for tasks performed    Balance     End of Session PT - End of Session Equipment Utilized During Treatment: Gait belt Activity Tolerance: Patient tolerated treatment well Patient left: in chair;with call bell/phone within reach Nurse Communication: Mobility status    Mertie Clause, SPTA 08/29/2012, 2:31 PM

## 2012-08-29 NOTE — Progress Notes (Signed)
Occupational Therapy Treatment Patient Details Name: Madeline Serrano MRN: [id number] DOB: 23-Feb-1930 Today's Date: 08/29/2012 Time: 1132-1205 OT Time Calculation (min): 33 min  OT Assessment / Plan / Recommendation Comments on Treatment Session This 82 making progress but slowly due to orthostatic BPs. Will continue to benefit from acute OT with follow up HHOT    Follow Up Recommendations  Home health OT    Barriers to Discharge       Equipment Recommendations  Rolling walker with 5" wheels;3 in 1 bedside comode    Recommendations for Other Services    Frequency Min 2X/week   Plan Discharge plan remains appropriate    Precautions / Restrictions Precautions Precautions: Fall Precaution Comments: orthostatic BP Restrictions Weight Bearing Restrictions: Yes RLE Weight Bearing: Weight bearing as tolerated Other Position/Activity Restrictions: Fluctuating BP   Pertinent Vitals/Pain Orthostatic BPs: Supine     163/55  HR 64    (post 1 minute) Sitting      148/56  HR 63    (post 1 minute) Standing  112/45  HR 65  (dizzines and nausea)    (post 1 minute) Sitting       146/83  HR 65    ADL  Eating/Feeding: Performed;Independent Where Assessed - Eating/Feeding: Chair Lower Body Dressing: Set up;Supervision/safety (with AE) Where Assessed - Lower Body Dressing: Unsupported sit to stand Toilet Transfer: Performed;Supervision/safety Toilet Transfer Method: Sit to Barista: Bedside commode Toileting - Clothing Manipulation and Hygiene: Performed;Supervision/safety (gown and underwear) Where Assessed - Toileting Clothing Manipulation and Hygiene: Sit to stand from 3-in-1 or toilet Equipment Used: Rolling walker;Gait belt ADL Comments: Pt is mainly setup/supervison due to orthostatic BPs (mainly symptomatic in standing.      OT Goals ADL Goals ADL Goal: Lower Body Dressing - Progress: Progressing toward goals ADL Goal: Toilet Transfer - Progress: Not  progressing (due to orthostatic BPs) Miscellaneous OT Goals OT Goal: Miscellaneous Goal #1 - Progress: Met  Visit Information  Last OT Received On: 08/29/12 Assistance Needed: +1 (within the room, +2 if out of room)    Subjective Data  Subjective: I feel dizzy and nauseous (when she stood up)      Cognition  Overall Cognitive Status: Appears within functional limits for tasks assessed/performed Arousal/Alertness: Awake/alert Orientation Level: Appears intact for tasks assessed Behavior During Session: [facility] for tasks performed    Mobility   Bed Mobility Bed Mobility: Supine to Sit Supine to Sit: 6: Modified independent (Device/Increase time);HOB flat Sitting - Scoot to Edge of Bed: 6: Modified independent (Device/Increase time) Details for Bed Mobility Assistance: Pt. presents sitting on bed side commode  Transfers Transfers: Sit to Stand;Stand to Sit Sit to Stand: 4: Min guard;With upper extremity assist;From bed Stand to Sit: 4: Min guard;With upper extremity assist;With armrests;To chair/3-in-1 Details for Transfer Assistance: VC's for safe hand placement             End of Session OT - End of Session Equipment Utilized During Treatment: Gait belt (RW) Activity Tolerance:  (limited by dizziness) Patient left: in chair;with call bell/phone within reach;with family/visitor present Nurse Communication:  (Nurse tech to please check on her at son's request)       [name] [phone] 08/29/2012, 2:44 PM

## 2012-08-29 NOTE — Progress Notes (Signed)
Orthostatic VS: Lying BP 176/60 P 63,  Sitting BP 139/64 P 63,  Standing BP 105/49 P 66. Will continue to monitor.

## 2012-08-30 LAB — CBC
Platelets: 212 10*3/uL (ref 150–400)
RBC: 4.03 MIL/uL (ref 3.87–5.11)
RDW: 14.6 % (ref 11.5–15.5)
WBC: 6.1 10*3/uL (ref 4.0–10.5)

## 2012-08-30 LAB — PROTIME-INR: INR: 2.67 — ABNORMAL HIGH (ref 0.00–1.49)

## 2012-08-30 LAB — BASIC METABOLIC PANEL
BUN: 13 mg/dL (ref 6–23)
Chloride: 94 mEq/L — ABNORMAL LOW (ref 96–112)
GFR calc Af Amer: 55 mL/min — ABNORMAL LOW (ref >90)
GFR calc non Af Amer: 47 mL/min — ABNORMAL LOW (ref >90)
Potassium: 3.9 mEq/L (ref 3.5–5.1)
Sodium: 128 mEq/L — ABNORMAL LOW (ref 135–145)

## 2012-08-30 MED ORDER — WARFARIN SODIUM 1 MG PO TABS
1.0000 mg | ORAL_TABLET | Freq: Every day | ORAL | Status: DC
  Administered 2012-08-30 – 2012-09-01 (×2): 1 mg via ORAL
  Filled 2012-08-30 (×4): qty 1

## 2012-08-30 NOTE — Progress Notes (Signed)
Occupational Therapy Treatment Patient Details Name: Madeline Serrano MRN: [id number] DOB: 03-05-1930 Today's Date: 08/30/2012 Time: 3235-5732 OT Time Calculation (min): 37 min  OT Assessment / Plan / Recommendation Comments on Treatment Session Pt limited primarily by her orthostasis.  Pt is well aware of safety and her hip precautions.  Son is working toward 24 hour care for pt.    Follow Up Recommendations  Home health OT;Supervision/Assistance - 24 hour    Barriers to Discharge       Equipment Recommendations  Rolling walker with 5" wheels;3 in 1 bedside comode    Recommendations for Other Services    Frequency Min 2X/week   Plan Discharge plan remains appropriate    Precautions / Restrictions Precautions Precautions: Fall;Posterior Hip Precaution Comments: Watch BP Restrictions RLE Weight Bearing: Weight bearing as tolerated   Pertinent Vitals/Pain No pain.    ADL  Grooming: Performed;Supervision/safety Where Assessed - Grooming: Unsupported standing Lower Body Dressing: Set up Where Assessed - Lower Body Dressing: Unsupported sitting;Other (comment) (instructed in use of elastic shoe laces) Toilet Transfer: Performed;Supervision/safety Toilet Transfer Method: Sit to Barista: Materials engineer and Hygiene: Performed;Supervision/safety Where Assessed - Engineer, mining and Hygiene: Sit to stand from 3-in-1 or toilet Equipment Used: Rolling walker;Gait belt Transfers/Ambulation Related to ADLs: supervision with RW ADL Comments: Son is working for pt to have 24 hour supervision of hired help.  Pt is well aware of her hip precautions.  Pt continues to have dizziness and weakness intermittently due to BP.    OT Diagnosis:    OT Problem List:   OT Treatment Interventions:     OT Goals ADL Goals Pt Will Perform Lower Body Bathing: with set-up;with supervision;Unsupported;Standing at sink;Sitting at  sink;with adaptive equipment Pt Will Perform Lower Body Dressing: with set-up;with supervision;Sit to stand from bed;Sit to stand from chair;Unsupported;with adaptive equipment ADL Goal: Lower Body Dressing - Progress: Met Pt Will Transfer to Toilet: with supervision;Ambulation;Comfort height toilet;Grab bars ADL Goal: Toilet Transfer - Progress: Met Pt Will Perform Toileting - Clothing Manipulation: with supervision;Standing ADL Goal: Toileting - Clothing Manipulation - Progress: Met Pt Will Perform Toileting - Hygiene: with supervision;Sit to stand from 3-in-1/toilet ADL Goal: Toileting - Hygiene - Progress: Met Pt Will Perform Tub/Shower Transfer: Shower transfer;with supervision;Ambulation;with DME;Shower seat without back;Grab bars;Maintaining hip precautions Miscellaneous OT Goals Miscellaneous OT Goal #1: Pt will be independent in and OOB for BADLs. OT Goal: Miscellaneous Goal #1 - Progress: Progressing toward goals Miscellaneous OT Goal #2: Pt will be able to state 3/3 hip precautions. OT Goal: Miscellaneous Goal #2 - Progress: Met Miscellaneous OT Goal #3: Son will state that he feels comfortable seeing all the education done with the patient, so that he will understand how she is to do things in case she needs cuing. OT Goal: Miscellaneous Goal #3 - Progress: Met  Visit Information  Last OT Received On: 08/30/12 Assistance Needed: +1    Subjective Data      Prior Functioning       Cognition  Overall Cognitive Status: Appears within functional limits for tasks assessed/performed Arousal/Alertness: Awake/alert Orientation Level: Appears intact for tasks assessed Behavior During Session: [facility] for tasks performed    Mobility  Shoulder Instructions Bed Mobility Bed Mobility: Supine to Sit;Sit to Supine;Sitting - Scoot to Edge of Bed Supine to Sit: 6: Modified independent (Device/Increase time);HOB flat Sitting - Scoot to Edge of Bed: 6: Modified independent (Device/Increase  time) Sit to Supine: 5: Supervision;HOB flat  Details for Bed Mobility Assistance: Pt. given no physical (A) when getting to EOB. Pt. encouraged to have her path way clear to increase safety Transfers Transfers: Sit to Stand;Stand to Sit Sit to Stand: 5: Supervision;From bed;From chair/3-in-1 Stand to Sit: 5: Supervision;To bed;To chair/3-in-1 Details for Transfer Assistance: supervision with RW       Exercises      Balance     End of Session OT - End of Session Activity Tolerance: Patient tolerated treatment well Patient left: in bed;with call bell/phone within reach;with family/visitor present  GO     Evern Bio 08/30/2012, 2:49 PM [phone]

## 2012-08-30 NOTE — Progress Notes (Signed)
Subjective: Continues to feel lightheaded when she gets up.   Exam: Filed Vitals:   08/30/12 1456  BP: 141/38  Pulse: 62  Temp: 98.2 F (36.8 C)  Resp: 18   Gen: In bed, NAD MS: awake, alert, oriented ZO:XWRUE, EOMI Motor: 5/5 throughout Sensory:intact to LT  Impression: 78 yo F with transient symptoms due to severe Orthostasis. Of note, she was started on carvedilol just prior to becoming symptomatic, but has Afib with RVR and is being continued on this. She has had two of her other anti-htn medications stopped.   Recommendations: 1) Her symptoms I feel are secondary to orthostasis. IT could be medication related, if there is no improvement, further workup would consist of autonomic function testing, EMG, and QSART, all of which cannot be done as an inpatient.  2) Anti-htn regimen per internal medicine.   Ritta Slot, MD [facility] [phone]  If 7pm- 7am, please page neurology on call at [phone].

## 2012-08-30 NOTE — Progress Notes (Signed)
Physical Therapy Treatment Patient Details Name: Madeline Serrano MRN: [id number] DOB: [birth year] Today's Date: 08/30/2012 Time: 4098-1191 PT Time Calculation (min): 43 min  PT Assessment / Plan / Recommendation Comments on Treatment Session  Pt. with increased ambulation distance however requiring seated rest breaks due to reports of dizzyness and light headedness. Continues with fluctuations in BP, dizzyness resolves quickly with seated rest breaks, but returns after short periods of ambulation. Continue to strongly recommed pt. to have 24hr supervision/(A) due to dizzyness.     Follow Up Recommendations  Home health PT;Supervision/Assistance - 24 hour     Does the patient have the potential to tolerate intense rehabilitation     Barriers to Discharge        Equipment Recommendations  Rolling walker with 5" wheels;3 in 1 bedside comode    Recommendations for Other Services    Frequency Min 5X/week   Plan Discharge plan remains appropriate;Frequency needs to be updated    Precautions / Restrictions Precautions Precautions: Fall;Posterior Hip Precaution Comments: Watch BP Restrictions RLE Weight Bearing: Weight bearing as tolerated   Pertinent Vitals/Pain Patient denies pain with PT session BP Supine 123/56 BP sitting EOB 125/62 BP standing 106/60 (pt. Reports being asymptomatic) BP sitting after ambulation: 116/48 (asymptomatic), 124/35 (asymptomatic), 101/61 (symptomatic, returned to room via chair)    Mobility  Bed Mobility Bed Mobility: Supine to Sit;Sit to Supine;Sitting - Scoot to Edge of Bed Supine to Sit: 6: Modified independent (Device/Increase time);HOB flat Sitting - Scoot to Edge of Bed: 6: Modified independent (Device/Increase time) Sit to Supine: 5: Supervision;HOB flat Details for Bed Mobility Assistance: Pt. given no physical (A) when getting to EOB. Pt. encouraged to have her path way clear to increase safety Transfers Transfers: Sit to Stand;Stand to  Sit Sit to Stand: 5: Supervision;From bed;From chair/3-in-1 Stand to Sit: 5: Supervision;To bed;To chair/3-in-1 Details for Transfer Assistance: supervision with RW Ambulation/Gait Ambulation/Gait Assistance: 4: Min guard Ambulation Distance (Feet): 75 Feet Assistive device: Rolling walker Ambulation/Gait Assistance Details: Pt. ambulated into hallway with RW min guard for safety and steadiness as she was reporting of feeling weak and dizzy while ambulating in hall, no c/o of dizzyness prior to beginning ambulation. Pt. given max VC for expressing how she is feeling as she would just stop and not say anything. BP taken throughout session and recorded into vitals. Pt. given max encouragement to slow down with gait as with increased gt. speed, she would report of increased dizzyness. Gait Pattern: Step-through pattern;Decreased stride length General Gait Details: pt. encouraged to decrease her gt. speed for safety and steadiness. Stairs: No Wheelchair Mobility Wheelchair Mobility: No      PT Goals Acute Rehab PT Goals PT Goal Formulation: With patient Time For Goal Achievement: 09/13/12 Potential to Achieve Goals: Good Pt will go Supine/Side to Sit: with HOB 0 degrees;Other (comment);Independently PT Goal: Supine/Side to Sit - Progress: Other (comment) (Updated due to time frame for goals) Pt will go Sit to Stand: with upper extremity assist;Independently PT Goal: Sit to Stand - Progress: Other (comment) (Updated due to time frame for goals) Pt will go Stand to Sit: with upper extremity assist;Independently PT Goal: Stand to Sit - Progress: Other (comment) (Updated due to time frame for goals) Pt will Ambulate: >150 feet;with least restrictive assistive device;Other (comment);Independently PT Goal: Ambulate - Progress: Other (comment) (Updated due to time frame for goals) Additional Goals Additional Goal #1: Pt will verbalize and adhere to hip precautions while mobilizing  Visit  Information  Last  PT Received On: 08/30/12 Assistance Needed: +1    Subjective Data  Subjective: "I have to have my BP taken" Patient Stated Goal: return home with son's assist   Cognition  Overall Cognitive Status: Appears within functional limits for tasks assessed/performed Arousal/Alertness: Awake/alert Orientation Level: Appears intact for tasks assessed Behavior During Session: [facility] for tasks performed    Balance     End of Session PT - End of Session Equipment Utilized During Treatment: Gait belt Activity Tolerance: Patient tolerated treatment well Patient left: in chair;with call bell/phone within reach Nurse Communication: Mobility status;Other (comment) (BP fluctuations)    Marilene Vath, SPTA 08/30/2012, 2:53 PM

## 2012-08-30 NOTE — Progress Notes (Signed)
Ameet Sandy, PTA [phone] 08/30/2012  

## 2012-08-30 NOTE — Progress Notes (Signed)
Orthostatic  VS: Lying BP 187/69, P 65 ;  Sitting BP 174/68 P 62  ;  Standing BP 120/52, P 66. Pt continue to c/o some lightheadedness. Will continue to monitor.

## 2012-08-30 NOTE — Progress Notes (Signed)
Subjective: She continues to feel lightheaded with ambulation but some improvement.  Visual symptoms are better.  She is very apprehensive about being discharged and risk of falling, readmission.   Objective: Vital signs in last 24 hours: Temp:  [97.1 F (36.2 C)-98.2 F (36.8 C)] 97.1 F (36.2 C) (11/27 0602) Pulse Rate:  [62-98] 66  (11/27 0602) Resp:  [18-20] 18  (11/27 0602) BP: (112-187)/(45-83) 120/52 mmHg (11/27 0602) SpO2:  [95 %-99 %] 98 % (11/27 0602) Weight change:  Last BM Date: 08/27/12  Orthostatic VS: Lying BP 187/69, P 65 ; Sitting BP 174/68 P 62 ; Standing BP 120/52, P 66.   CBG (last 3)  No results found for this basename: GLUCAP:3 in the last 72 hours  Intake/Output from previous day: 11/26 0701 - 11/27 0700 In: 1340 [P.O.:440; I.V.:900] Out: -  Intake/Output this shift:    General appearance: alert and no distress; no lightheadedness when I sat her on the side of the bed. Eyes: no scleral icterus Throat: oropharynx moist without erythema Resp: clear to auscultation bilaterally Cardio: regular rate and rhythm GI: soft, non-tender; bowel sounds normal; no masses,  no organomegaly Extremities: no clubbing, cyanosis or edema Neurologic: nonfocal   Lab Results:  [facility] 08/30/12 0520 08/29/12 0619  NA 128* 128*  K 3.9 4.0  CL 94* 94*  CO2 23 24  GLUCOSE 110* 113*  BUN 13 13  CREATININE 1.08 1.19*  CALCIUM 9.4 9.8  MG -- --  PHOS -- --     Basename 08/30/12 0520 08/29/12 0619  WBC 6.1 7.0  NEUTROABS -- --  HGB 12.1 12.1  HCT 36.3 36.0  MCV 90.1 90.9  PLT 212 235   Lab Results  Component Value Date   INR 2.67* 08/30/2012   INR 2.71* 08/29/2012   INR 2.75* 08/28/2012    Studies/Results: No results found.   Medications: Scheduled:   . aspirin  81 mg Oral Daily  . carvedilol  25 mg Oral BID WC  . digoxin  0.125 mg Oral Daily  . docusate sodium  100 mg Oral BID  . flecainide  50 mg Oral BID  . iron polysaccharides  150 mg Oral  Daily  . tiotropium  18 mcg Inhalation Daily  . [COMPLETED] warfarin  2 mg Oral ONCE-1800  . Warfarin - Pharmacist Dosing Inpatient   Does not apply q1800   Continuous:   Assessment/Plan: Principal Problem:  1. *TIA (transient ischemic attack)- recurrent near syncope, visual/speech changes are most likely orthostasis given postural relationship. No new neurologic changes. Continue ASA, Coumadin for CVA prevention.  Secondary workup negative. Active Problems:  2. Orthostasis- slight improvement symptomatically with IV fluids and discontinuation of Maxzide, ARB.  However, BP continues to drop precipitously with standing.  Cortisol normal.  Given supine hypertension, I am reluctant to add Florinef.  Encouraged her to stand slowly to prevent falls. 3. Atrial fibrillation- continue Fleicanide, Digoxin (level ok), and Coumadin. INR therapeutic.  4. Hypertension- high supine BPs but very low standing pressures. All BP meds discontinued except Coreg. Will only treat standing blood pressures. 5. COPD (chronic obstructive pulmonary disease)- continue Spiriva.  6. Hyponatremia- chronic.  Monitor off of Maxzide with IV fluids. 7. Disposition- she and son remain apprehensive about discharge due to ongoing issues and high risk for hospitalization. Will monitor with IV fluids and med changes and consider discharge in 1-2 days if improved.  We discussed possibility of return to SNF rehab if she is unable to go home but  she strongly prefers to go home.  Continue PT/OT to assist with coping strategies for orthostasis.    LOS: 6 days   SHAW,W DOUGLAS 08/30/2012, 7:41 AM

## 2012-08-30 NOTE — Progress Notes (Signed)
Agree with PT updated goals.  [place], [place] DPT [phone]

## 2012-08-30 NOTE — Progress Notes (Signed)
ANTICOAGULATION CONSULT NOTE - Follow Up Consult  Pharmacy Consult for Coumadin Indication: atrial fibrillation and secondary stroke prophylaxis  Allergies  Allergen Reactions  . Sulfa Antibiotics Other (See Comments)    [name]  . Celebrex (Celecoxib) Swelling  . Clarithromycin Swelling  . Statins Other (See Comments)    Hair fell out  . Vioxx (Rofecoxib) Other (See Comments)    Renal problems  . Penicillins Rash    Patient Measurements: Height: 5\' 6"  (167.6 cm) Weight: 173 lb 1 oz (78.5 kg) IBW/kg (Calculated) : 59.3   Vital Signs: Temp: 97.5 F (36.4 C) (11/27 0936) Temp src: Oral (11/27 0936) BP: 131/46 mmHg (11/27 0936) Pulse Rate: 70  (11/27 0936)  Labs:  Basename 08/30/12 0520 08/29/12 0619 08/28/12 0735  HGB 12.1 12.1 --  HCT 36.3 36.0 --  PLT 212 235 --  APTT -- -- --  LABPROT 27.1* 27.4* 27.7*  INR 2.67* 2.71* 2.75*  HEPARINUNFRC -- -- --  CREATININE 1.08 1.19* 1.22*  CKTOTAL -- -- --  CKMB -- -- --  TROPONINI -- -- --    Estimated Creatinine Clearance: 43.9 ml/min (by C-G formula based on Cr of 1.08).  Assessment: 79 y.o. F who presented to the [facility] with diffuse headache and photophobia, L facial droop, and altered senstation in LUE to r/o TIA. Pt is s/p recent R hip hemiarthroplasty in October and has been residing at [facility] for rehab. The patient has a history of Afib and presented with a SUBtherapeutic INR of 1.58 on warfarin.   PTA the patient's NH report stated she was taking 2 mg only on Tues/Wed/Thurs/Sat -- no warfarin was being given on Sun/Mon/Fri. (Admit INR 1.58). Will try maintenance regimen.   Goal of Therapy:  INR 2-3  Monitor platelets by anticoagulation protocol: Yes  Plan:  Coumadin 1mg  daily and f/u daily protime.  Verlene Mayer, PharmD, BCPS Pager [phone] 08/30/2012,11:16 AM

## 2012-08-31 LAB — CBC
Hemoglobin: 11.3 g/dL — ABNORMAL LOW (ref 12.0–15.0)
MCH: 30.5 pg (ref 26.0–34.0)
Platelets: 198 10*3/uL (ref 150–400)
RBC: 3.7 MIL/uL — ABNORMAL LOW (ref 3.87–5.11)
WBC: 5.3 10*3/uL (ref 4.0–10.5)

## 2012-08-31 LAB — PROTIME-INR
INR: 3.37 — ABNORMAL HIGH (ref 0.00–1.49)
Prothrombin Time: 32.2 seconds — ABNORMAL HIGH (ref 11.6–15.2)

## 2012-08-31 LAB — BASIC METABOLIC PANEL
CO2: 22 mEq/L (ref 19–32)
Chloride: 97 mEq/L (ref 96–112)
Creatinine, Ser: 1.05 mg/dL (ref 0.50–1.10)
GFR calc Af Amer: 57 mL/min — ABNORMAL LOW (ref >90)
Potassium: 4 mEq/L (ref 3.5–5.1)
Sodium: 129 mEq/L — ABNORMAL LOW (ref 135–145)

## 2012-08-31 NOTE — Progress Notes (Signed)
ANTICOAGULATION CONSULT NOTE - Follow Up Consult  Pharmacy Consult for coumadin Indication: atrial fibrillation  Allergies  Allergen Reactions  . Sulfa Antibiotics Other (See Comments)    [name]  . Celebrex (Celecoxib) Swelling  . Clarithromycin Swelling  . Statins Other (See Comments)    Hair fell out  . Vioxx (Rofecoxib) Other (See Comments)    Renal problems  . Penicillins Rash    Patient Measurements: Height: 5\' 6"  (167.6 cm) Weight: 173 lb 1 oz (78.5 kg) IBW/kg (Calculated) : 59.3    Vital Signs: Temp: 97.5 F (36.4 C) (11/28 0510) Temp src: Oral (11/28 0510) BP: 164/53 mmHg (11/28 0510) Pulse Rate: 66  (11/28 0510)  Labs:  Basename 08/31/12 0615 08/30/12 0520 08/29/12 0619  HGB 11.3* 12.1 --  HCT 33.2* 36.3 36.0  PLT 198 212 235  APTT -- -- --  LABPROT 32.2* 27.1* 27.4*  INR 3.37* 2.67* 2.71*  HEPARINUNFRC -- -- --  CREATININE 1.05 1.08 1.19*  CKTOTAL -- -- --  CKMB -- -- --  TROPONINI -- -- --    Estimated Creatinine Clearance: 45.2 ml/min (by C-G formula based on Cr of 1.05).   Medications:  Scheduled:    . aspirin  81 mg Oral Daily  . carvedilol  25 mg Oral BID WC  . digoxin  0.125 mg Oral Daily  . docusate sodium  100 mg Oral BID  . flecainide  50 mg Oral BID  . iron polysaccharides  150 mg Oral Daily  . tiotropium  18 mcg Inhalation Daily  . warfarin  1 mg Oral q1800  . Warfarin - Pharmacist Dosing Inpatient   Does not apply q1800    Assessment: 78 y.o. F who presented to the [facility] with diffuse headache and photophobia, L facial droop, and altered senstation in LUE due to severe orthostasis. The patient has a history of Afib and presented with a SUBtherapeutic INR of 1.58 on warfarin. INR today ~3.4, CBC stable and no bleeding noted per RN.    Goal of Therapy:  INR 2-3 Monitor platelets by anticoagulation protocol: Yes   Plan:  1. Hold coumadin today 2. Follow up INR in AM 3. Monitor for s/sx of bleeding  Bola A. Wandra Feinstein D Clinical Pharmacist Pager:[phone] Phone [phone] 08/31/2012 8:44 AM

## 2012-08-31 NOTE — Progress Notes (Signed)
Subjective: Patient no apparent distress, has been out of bed 3 times today with assistance but still feels somewhat lightheaded, ambulating very slowly with assistance. States that she's not had any new visual symptoms. Has had some nausea low-grade which is somewhat chronic, is not very much of the institutional food and is looking forward to a meal provided by family later today, no dominant pain, muscle skeletal pain controlled, still quite apprehensive about going home and also heeding the advice of neurology consultants given that her orthostasis is not resolved.  Objective: Vital signs in last 24 hours: Temp:  [97.2 F (36.2 C)-98.2 F (36.8 C)] 97.9 F (36.6 C) (11/28 1124) Pulse Rate:  [61-68] 62  (11/28 1124) Resp:  [18-20] 18  (11/28 1124) BP: (141-176)/(38-56) 162/56 mmHg (11/28 1124) SpO2:  [95 %-100 %] 99 % (11/28 1124) Weight change:  Last BM Date: 08/27/12  CBG (last 3)  No results found for this basename: GLUCAP:3 in the last 72 hours  Intake/Output from previous day:   Intake/Output this shift:   general appearance-alert and oriented, no distress, appropriate Sclera anicteric, extraocular movements appear intact No oropharyngeal lesions, neck supple, no cervical lymphadenopathy Lungs clear to auscultation bilaterally Cardiovascular reveals regular rate and rhythm Abdomen, soft, nontender, nondistended, bowel sounds are present No edema, pedal pulses are intact, neurologic exam nonfocal, no orthostatics in the chart for today.    Lab Results:  [facility] 08/31/12 0615 08/30/12 0520  NA 129* 128*  K 4.0 3.9  CL 97 94*  CO2 22 23  GLUCOSE 102* 110*  BUN 12 13  CREATININE 1.05 1.08  CALCIUM 9.0 9.4  MG -- --  PHOS -- --   No results found for this basename: AST:2,ALT:2,ALKPHOS:2,BILITOT:2,PROT:2,ALBUMIN:2 in the last 72 hours  Basename 08/31/12 0615 08/30/12 0520  WBC 5.3 6.1  NEUTROABS -- --  HGB 11.3* 12.1  HCT 33.2* 36.3  MCV 89.7 90.1  PLT 198 212    No results found for this basename: CKTOTAL:3,CKMB:3,CKMBINDEX:3,TROPONINI:3 in the last 72 hours No results found for this basename: TSH,T4TOTAL,FREET3,T3FREE,THYROIDAB in the last 72 hours No results found for this basename: VITAMINB12:2,FOLATE:2,FERRITIN:2,TIBC:2,IRON:2,RETICCTPCT:2 in the last 72 hours  Studies/Results: No results found.   Medications: Scheduled:   . aspirin  81 mg Oral Daily  . carvedilol  25 mg Oral BID WC  . digoxin  0.125 mg Oral Daily  . docusate sodium  100 mg Oral BID  . flecainide  50 mg Oral BID  . iron polysaccharides  150 mg Oral Daily  . tiotropium  18 mcg Inhalation Daily  . warfarin  1 mg Oral q1800  . Warfarin - Pharmacist Dosing Inpatient   Does not apply q1800   Continuous:   Assessment/Plan: Principal Problem:  *TIA (transient ischemic attack)-chart reviewed, recurrent issues, but none in the last few days, but complicated by orthostasis, no orthostatics in the chart for today, medications have been adjusted, no new neurological changes, continues on anticoagulation with Coumadin, aspirin, secondary workup per Dr. Clelia Croft negative. Active Problems: Orthostasis-symptomatically improved, however now IV fluids have been discontinued along with diuretics, her pressure is elevated in the supine condition, historically cortisol level is normal, will recheck orthostatics, patient not comfortable with discharge home, will continue neurologic workup with neurology consults. Atrial fibrillation-hemodynamically stable, rhythm well controlled, on current medications, on Coumadin per pharmacy protocol Blood pressure slightly high in the supine position will check orthostatics, patient on beta blocker twice daily COPD stable, no bronchospasm on exam Hypernatremia chronic stable off Maxide Disposition  pending orthostatics, physical and therapy and occupational therapy, monitoring for recurrent falls, and further evaluation by neurology if indicated based on  above data.     LOS: 7 days   Arpita Fentress R 08/31/2012, 11:51 AM

## 2012-08-31 NOTE — Progress Notes (Signed)
Subjective: Continues to improve, albeit slower than she would like.   She saw some illusions earlier in the hospitalization(pattern on wall, wallpaper coming off of wall) but this has resolved.   Exam: Filed Vitals:   08/31/12 1827  BP: 137/51  Pulse: 67  Temp:   Resp:    Gen: In bed, NAD MS: Awake, Alert, Oriented ZO:XWRUE, EOMI, fixates and tracks Motor: 5/5 thorughout Sensory:intact to LT   Impression: 78 yo F with orthostatis. I suspect her initial presenting symptom of TIA was secondary to hypoperfusion in the setting of orthostasis. She has symptomatically improved, but continues to have problems. Of note, she had afib with RVR during a previous hospitalization and was started on carvedilol at that time.   It is possible that she was compensating for peripheral pooling previously by increasing heart rate, and is no longer capable of doing this secondary to beta blockade.   The visual symptoms are likely due to a very mild delirium coupled with visual deficit from macular degeneration. One consideration, however with hallucinations and orthostasis would be lewy body dementia, but this is not a diagnosis that could be made at the current time. I do not feel further workup of these illusions are necessary at this time.   Finally, her treatment at this time would be symptomatic given that any further workup from neurological causes of orthostasis would need to be done as an outpatient. With her supine hypertension, florinef and salt tablets are less appealing, but she could benefit from low dose midodrine with the instruction to not lay down within 4 hours of a dose.   Recommendations: 1) Consider changing from carvedilol, or decreasing dose. Will defer to internal medicine 2) As an outpatient, if she continues to be symptomatic, would consider referral to an autonomic center where AFTs, EMGs, and/or QSART studies could be performed.  3) could consider adding midodrine, 2.5mg  TID  with no supine posture for four hours following a dose.  4) If above is not helpful, could consider florinef or salt tablets.  5) At this time, the above represent my recommendations. Please call if any further questions remain.   Ritta Slot, MD [facility] [phone]  If 7pm- 7am, please page neurology on call at [phone].

## 2012-09-01 LAB — BASIC METABOLIC PANEL
CO2: 24 mEq/L (ref 19–32)
Calcium: 9.6 mg/dL (ref 8.4–10.5)
Potassium: 4.2 mEq/L (ref 3.5–5.1)
Sodium: 132 mEq/L — ABNORMAL LOW (ref 135–145)

## 2012-09-01 LAB — PROTIME-INR
INR: 2.88 — ABNORMAL HIGH (ref 0.00–1.49)
Prothrombin Time: 28.7 seconds — ABNORMAL HIGH (ref 11.6–15.2)

## 2012-09-01 LAB — CBC
Hemoglobin: 11.4 g/dL — ABNORMAL LOW (ref 12.0–15.0)
MCHC: 33.1 g/dL (ref 30.0–36.0)
RDW: 14.6 % (ref 11.5–15.5)
WBC: 5.4 10*3/uL (ref 4.0–10.5)

## 2012-09-01 MED ORDER — POLYETHYLENE GLYCOL 3350 17 G PO PACK: 17.0000 g | PACK | Freq: Every day | ORAL | Status: DC | PRN

## 2012-09-01 MED ORDER — CARVEDILOL 12.5 MG PO TABS: 12.5000 mg | ORAL_TABLET | Freq: Two times a day (BID) | ORAL | Status: DC

## 2012-09-01 MED ORDER — ASPIRIN 81 MG PO CHEW: 81.0000 mg | CHEWABLE_TABLET | Freq: Every day | ORAL | Status: DC

## 2012-09-01 MED ORDER — WARFARIN SODIUM 2 MG PO TABS: 2.0000 mg | ORAL_TABLET | Freq: Every day | ORAL | Status: DC

## 2012-09-01 MED ORDER — POLYSACCHARIDE IRON COMPLEX 150 MG PO CAPS: 150.0000 mg | ORAL_CAPSULE | Freq: Every day | ORAL | Status: DC

## 2012-09-01 MED ORDER — CARVEDILOL 12.5 MG PO TABS
12.5000 mg | ORAL_TABLET | Freq: Two times a day (BID) | ORAL | Status: DC
  Administered 2012-09-01: 12.5 mg via ORAL
  Filled 2012-09-01 (×2): qty 1

## 2012-09-01 NOTE — Discharge Summary (Signed)
DISCHARGE SUMMARY  Madeline Serrano  MR#: [id number]  DOB:[birth year]  Date of Admission: 08/24/2012 Date of Discharge: 09/01/2012  Attending Physician:Grove Defina,W DOUGLAS  Patient's PCP: Kari Baars  Consults: Neurohospitalists- Dr. Thad Ranger and Amada Jupiter  Discharge Diagnoses: Principal Problem:  *TIA (transient ischemic attack) with expressive aphasia, visual changes, left sided weakness, resolved Active Problems:  Severe Orthostasis  Hip fracture, right  paroxysmal Atrial fibrillation  Hypertension  COPD (chronic obstructive pulmonary disease)   Past Medical History:  COPD- PFTs in 2/13- moderate (FEV1/FVC 60%, FEV1 1.2L)  HTN (renal artery study normal 3/09)  history of Acute renal failure  Anemia  paroxysmal Atrial fib. on anticoagulation  Hyperlipidemia  IFG  Osteoporosis 7/10 (t-2.6 Left femoral neck)- left foot stress fracture (11/12), remote left humerus fracture  Macular degeneration   Past Surgical History:  1984 bladder prolapse  1999 knee arthroscopic  2001 knee arth.  2001 cataract OD  1967 (R) #2, 3 finger grafts   Discharge Medications:   Medication List     As of 09/01/2012  8:47 AM    STOP taking these medications         estrogen (conjugated)-medroxyprogesterone 0.3-1.5 MG per tablet   Commonly known as: PREMPRO      methocarbamol 500 MG tablet   Commonly known as: ROBAXIN      TAKE these medications         aspirin 81 MG chewable tablet   Chew 1 tablet (81 mg total) by mouth daily.      calcitonin (salmon) 200 UNIT/ACT nasal spray   Commonly known as: MIACALCIN/FORTICAL   Place 1 spray into the nose daily. Alternate nostrils      calcium-vitamin D 500-200 MG-UNIT per tablet   Commonly known as: OSCAL WITH D   Take 1 tablet by mouth 3 (three) times daily.      carvedilol 12.5 MG tablet   Commonly known as: COREG   Take 1 tablet (12.5 mg total) by mouth 2 (two) times daily with a meal.      digoxin 0.125 MG tablet   Commonly  known as: LANOXIN   Take 0.125 mg by mouth daily.      docusate sodium 100 MG capsule   Commonly known as: COLACE   Take 100 mg by mouth 2 (two) times daily.      flecainide 50 MG tablet   Commonly known as: TAMBOCOR   Take 50 mg by mouth 2 (two) times daily.      HYDROcodone-acetaminophen 5-325 MG per tablet   Commonly known as: NORCO/VICODIN   Take 1-2 tablets by mouth every 4 (four) hours as needed. For pain. 1 tablet for moderate pain and 2 tablets for severe pain      ICAPS Tabs   Take 2 tablets by mouth 2 (two) times daily.      iron polysaccharides 150 MG capsule   Commonly known as: NIFEREX   Take 1 capsule (150 mg total) by mouth daily.      lansoprazole 30 MG capsule   Commonly known as: PREVACID   Take 30 mg by mouth daily.      multivitamin with minerals Tabs   Take 1 tablet by mouth daily.      polyethylene glycol packet   Commonly known as: MIRALAX / GLYCOLAX   Take 17 g by mouth daily as needed.      rosuvastatin 10 MG tablet   Commonly known as: CRESTOR   Take 5 mg by mouth every Monday,  Wednesday, and Friday.      tiotropium 18 MCG inhalation capsule   Commonly known as: SPIRIVA   Place 18 mcg into inhaler and inhale daily.      traZODone 50 MG tablet   Commonly known as: DESYREL   Take 50 mg by mouth at bedtime as needed. For insomnia      warfarin 2 MG tablet   Commonly known as: COUMADIN   Take 1 tablet (2 mg total) by mouth daily. Tue, wed, thur, sat        Hospital Procedures: Ct Head Wo Contrast  08/26/2012  *RADIOLOGY REPORT*  Clinical Data: Right facial droop, slurred speech and right-sided weakness.  CT HEAD WITHOUT CONTRAST  Technique:  Contiguous axial images were obtained from the base of the skull through the vertex without contrast.  Comparison: 08/24/2012  Findings: Stable cortical atrophy. The brain demonstrates no evidence of hemorrhage, infarction, edema, mass effect, extra-axial fluid collection, hydrocephalus or mass lesion.   The skull is unremarkable.  Chronic appearing opacification of the left maxillary antrum with associated bony sclerosis involving anterior and posterolateral walls of the left maxillary sinus.  IMPRESSION: Stable atrophy with no acute intracranial findings.  Chronic appearing left maxillary sinusitis.   Original Report Authenticated By: Irish Lack, M.D.    Ct Head Wo Contrast  08/24/2012  *RADIOLOGY REPORT*  Clinical Data: 78 year old female with headache, nausea, dizziness.  CT HEAD WITHOUT CONTRAST  Technique:  Contiguous axial images were obtained from the base of the skull through the vertex without contrast.  Comparison: None.  Findings: Mucoperiosteal thickening of the left maxillary sinus and to a lesser extent both sphenoid sinuses.  Mild bubbly opacity in the right sphenoid.  Other Visualized paranasal sinuses and mastoids are clear.  No acute osseous abnormality identified.  No acute orbit soft tissue findings.  Negative scalp soft tissues.  Cerebral volume is within normal limits for age.  No midline shift, ventriculomegaly, mass effect, evidence of mass lesion, intracranial hemorrhage or evidence of cortically based acute infarction.  Gray-white matter differentiation is within normal limits throughout the brain.  No suspicious intracranial vascular hyperdensity.  IMPRESSION: 1. Normal noncontrast CT appearance of the brain for age. 2.  Chronic paranasal sinusitis.   Original Report Authenticated By: Erskine Speed, M.D.    Mr [name] Wo Contrast  08/26/2012  *RADIOLOGY REPORT*  Clinical Data: 78 year old female with right facial droop and expressive aphasia.  Atrial fibrillation.  MRA HEAD WITHOUT CONTRAST  Technique: Angiographic images of the Circle of Willis were obtained using MRA technique without intravenous contrast.  Comparison: Brain MRI 08/24/2012.  Findings: Dominant distal right vertebral artery with antegrade flow.  Diminutive right PICA is patent.  The right vertebral artery  supplies the basilar artery which is patent without stenosis.  Diminutive distal left vertebral artery occludes proximal to the vertebrobasilar junction.  Left PICA not definitely identified.  Basilar tip is within normal limits.  SCA and PCA origins are normal.  Posterior communicating arteries are diminutive or absent. Bilateral PCA branches are within normal limits.  Antegrade flow in both ICA siphons.  Bilateral supraclinoid ICA irregularity.  Mild bilateral supraclinoid ICA stenosis.  Patent carotid termini.  MCA origins are within normal limits. Dominant right ACA A1 segment.  Normal anterior communicating artery.  Visualized ACA branches are within normal limits.  Normal right MCA M1 segment and visualized right MCA branches.  The left MCA is diminutive and irregular suggesting atherosclerosis. The left MCA bifurcation is patent.  The left MCA M2 and more distal visualized branches appear more normal and more symmetric compared to those on the right.  No left MCA branch occlusion identified.  IMPRESSION: 1.  Atherosclerotic nondominant distal left vertebral artery occludes proximal to the basilar artery. 2.  The basilar artery is therefore supplied by the right vertebral and is patent without stenosis. 3.  The remaining posterior circulation is within normal limit. 4.  Bilateral supraclinoid ICA atherosclerosis and mild stenosis. Patent carotid termini. 5.  Atherosclerotic and diminutive left MCA M1 segment, but no major MCA branch occlusion or focal high-grade stenosis identified. 6. Remaining anterior circulation within normal limits.   Original Report Authenticated By: Erskine Speed, M.D.    Mr [facility] Wo Contrast  08/24/2012  *RADIOLOGY REPORT*  Clinical Data: Headache with nausea and dizziness.  Atrial fibrillation and hypertension.  Possible CVA.  MRI HEAD WITHOUT AND WITH CONTRAST  Technique:  Multiplanar, multiecho pulse sequences of the brain and surrounding structures were obtained according to  standard protocol without and with intravenous contrast  Contrast:  MultiHance 17 ml.  Comparison:  CT head earlier in the day.  Findings:  There is no evidence for acute infarction, intracranial hemorrhage, mass lesion, hydrocephalus, or extra-axial fluid. Moderate atrophy is present.  Mild chronic microvascular ischemic change is present in the periventricular and subcortical white matter. No foci of chronic hemorrhage.  Moderate sized lacuna left periventricular white matter.  Calvarium intact.  Significant left maxillary sinus disease.  Bilateral cataract extraction.  Normal midline anatomy.  Intracranial vasculature widely patent.  Post infusion, no abnormal enhancement of the brain or meninges.  IMPRESSION: Moderate atrophy.  Mild white matter changes.  No acute stroke or acute hemorrhage.  No abnormal intracranial enhancement.  Unchanged from prior CT.   Original Report Authenticated By: Davonna Belling, M.D.    Carotid Dopplers (11/22)-  - Bilaterally <40% internal carotid artery stenosis with antegrade vertebral flow. - Vertebrals have antegrade flow.  Echocardiogram (11/22)- - Left ventricle: The cavity size was normal. There was moderate focal basal hypertrophy of the septum. Systolic function was normal. The estimated ejection fraction was in the range of 60% to 65%. Wall motion was normal; there were no regional wall motion abnormalities. Doppler parameters are consistent with abnormal left ventricular relaxation (grade 1 diastolic dysfunction). - Aortic valve: There was mild stenosis. Trivial regurgitation. Valve area: 2.12cm^2(VTI). Valve area: 2.07cm^2 (Vmax). - Mitral valve: Calcified annulus. - Left atrium: The atrium was moderately dilated.  History of Present Illness: Madeline Serrano is an 78 y.o. female. Patient presents from [facility], where she was scheduled to be discharged on Sunday per her report. She is s/p R hip hemiarthroplasty for R subcapital femoral neck fracture in  October. She also has a h/o COPD, a-fib, HTN, HLD. Patient went to exercise regimen and had a diffuse headache. A/w word finding difficulties. Physician assistant at rehab facility gave her an ASA 325 and told her to come to ER to rule out stroke. Denies fevers, chills, vomiting, diarrhea, abdominal pain, neck stiffness, blurry vision but reports some photophobia.  CT head and MRI brain were unremarkable. She will be admitted for TIA r/o. Remarkable labs are low INR, low sodium (stable from 1 month prior), low Hgb (improved from 1 month prior), and impaired GFR (stable).    Hospital Course: Ms. Solan was admitted to a telemetry bed. She underwent evaluation for TIA which included echocardiogram, carotid Dopplers, MRI/MRA of the brain which were all unrevealing except for  left vertebral atherosclerotic narrowing at the basilar artery (results noted above). She was placed on aspirin in addition to her Coumadin. Heparin was continued until her INR was therapeutic given her history of atrial fibrillation and the concern of possible emboli.  Her initial blood pressures were markedly elevated so Avapro was added to her home antihypertensive regimen.  Physical therapy/occupational therapy were consulted. With ambulation, the patient had recurrence of her TIA symptoms characterized by visual scotoma, severe unsteadiness/light headedness, transient speech changes, and at one time a left facial droop. The symptoms initially recurred each time she ambulated (similar to the episode at the SNF). Orthostatic blood pressures were obtained and showed a significant drop in her blood pressure from 180s to 100s with standing. This correlated with her symptoms as her symptoms improved when she was supine. Given the significant orthostasis, her Avapro and Maxzide were sequentially discontinued. She was hydrated with IV fluids. Neurology was consulted early during her hospitalization and concurred that her recurrent TIA symptoms were  secondary to hypoperfusion in the setting of orthostasis. They reviewed her TIA workup and did not feel that the vertebral artery abnormalities warranted further management. With adjustment of her medications and hydration her symptoms have slowly improved. She has not had recurrent TIA symptoms for the past 3 days though she does feel a little lightheaded with ambulation. Given the temporal relationship to initiation of her carvedilol during her recent hospitalization, her carvedilol dose will be decreased prior to discharge. It is possible that she lacks the ability to have a heart rate response to orthostasis in the setting of her beta-blockade. She has remained in normal sinus rhythm on flecainide and is also on digoxin for rate control. If her symptoms persist, we may need to discontinue her carvedilol completely. Other considerations include low dose Midodrine or Florinef . However, I am reluctant to do that at this time as her supine blood pressures have remained elevated in the 160s to 180s at times.  At this time, she is stable for discharge home with home health physical therapy, occupational therapy, RN, and 24-hour assistance. Her son who is visiting from [place] will be here through the weekend and is arranging in-home assistance as well. I've cautioned her about fall risk and have emphasized the importance of drinking to be glasses of water each morning, staying hydrated, sodium was out of bed for at least 1 minute before standing to walk and using a walker. Hopefully, with these precautions she will do well at home and further adjustments can be made as needed in the outpatient setting.  Day of Discharge Exam BP 122/59  Pulse 71  Temp 98.1 F (36.7 C) (Oral)  Resp 18  Ht 5\' 6"  (1.676 m)  Wt 78.5 kg (173 lb 1 oz)  BMI 27.93 kg/m2  SpO2 100%  Physical Exam: General appearance: no acute distress, appears less anxious Eyes: no scleral icterus Throat: oropharynx moist without  erythema Resp: clear to ausculatation bilaterally  Cardio: Regular, Rate and rhythm.  No murmurs, rubs or gallops GI: soft, non-tender; bowel sounds normal; no masses,  no organomegaly Extremities: no clubbing, cyanosis or edema Neuro: no focal changes  Discharge Labs:  [facility] 09/01/12 0605 08/31/12 0615  NA 132* 129*  K 4.2 4.0  CL 97 97  CO2 24 22  GLUCOSE 101* 102*  BUN 12 12  CREATININE 1.11* 1.05  CALCIUM 9.6 9.0  MG -- --  PHOS -- --    Basename 09/01/12 0605 08/31/12 0615  WBC 5.4 5.3  NEUTROABS -- --  HGB 11.4* 11.3*  HCT 34.4* 33.2*  MCV 91.0 89.7  PLT 205 198   Lab Results  Component Value Date   INR 2.88* 09/01/2012   INR 3.37* 08/31/2012   INR 2.67* 08/30/2012   Digoxin 1.1 Cortisol 14  Discharge instructions:     Discharge Orders    Future Orders Please Complete By Expires   Diet - low sodium heart healthy      Increase activity slowly      Discharge instructions      Comments:   Be very careful to prevent falls. When you sit or stand from a lying position nature use it for at least 1 minute before trying to stand. Use a a walker to prevent falls.  Wear TED hose during the day. Okay to remove at night. Drink 2 glasses of water each morning. Call the office if you have increase in lightheadedness, dizziness, or falls.      Disposition: to home with son and HHPT, OT, RN  Follow-up Appts: Follow-up with Dr. Clelia Croft at [facility] in 1 week.  Office will call for appointment.  Condition on Discharge: stable  Tests Needing Follow-up:  INR check next week at follow-up visit  Time with Discharge Activities- 45 minutes   Signed: Thos Matsumoto,W DOUGLAS 09/01/2012, 8:47 AM

## 2012-09-01 NOTE — Progress Notes (Signed)
Physical Therapy Treatment Patient Details Name: Madeline Serrano MRN: [id number] DOB: August 29, 1930 Today's Date: 09/01/2012 Time: 4098-1191 PT Time Calculation (min): 29 min  PT Assessment / Plan / Recommendation Comments on Treatment Session  Pt is progressing with mobility and was able to ambulate almost 200' with BP stable and no symptoms. However, last 25', pt began to have mild vision changes and mental status changes and BP dropped to 118/43 (had been 160/73). Educated pt on safety issues and managing hypotension.  Pt's son is looking for caregivers to be with her in the daytime. Recommend HHPT.    Follow Up Recommendations  Home health PT;Supervision/Assistance - 24 hour     Does the patient have the potential to tolerate intense rehabilitation     Barriers to Discharge        Equipment Recommendations  Rolling walker with 5" wheels;3 in 1 bedside comode    Recommendations for Other Services    Frequency Min 5X/week   Plan Discharge plan remains appropriate;Frequency needs to be updated    Precautions / Restrictions Precautions Precautions: Fall;Posterior Hip Precaution Comments: Watch BP, pt verbalized 3/3 hip precautions Restrictions Weight Bearing Restrictions: Yes RLE Weight Bearing: Weight bearing as tolerated Other Position/Activity Restrictions: Fluctuating BP   Pertinent Vitals/Pain No c/o pain BP supine 158/67 Sitting 160/73 Standing at end of ambulation 118/43    Mobility  Bed Mobility Bed Mobility: Supine to Sit Supine to Sit: 7: Independent Sitting - Scoot to Edge of Bed: 7: Independent Transfers Transfers: Sit to Stand;Stand to Sit Sit to Stand: 6: Modified independent (Device/Increase time);From bed Stand to Sit: 6: Modified independent (Device/Increase time);To chair/3-in-1 Details for Transfer Assistance: verbal clue to remember hand placement which she did. After standing, pt reports that she has a hard time remembering to stand up slowly, encouraged  to pause once standing to ask herself if she feels alright before beginning to ambulate Ambulation/Gait Ambulation/Gait Assistance: 5: Supervision Ambulation Distance (Feet): 200 Feet Assistive device: Rolling walker Ambulation/Gait Assistance Details: ambulated 100' and took a 2 min seated rest break, then 100'.  Began to have some vision changes and feel abnormal not until last 25' of ambulation Gait Pattern: Step-through pattern Stairs: No Wheelchair Mobility Wheelchair Mobility: No    Exercises     PT Diagnosis:    PT Problem List:   PT Treatment Interventions:     PT Goals Acute Rehab PT Goals PT Goal Formulation: With patient Time For Goal Achievement: 09/13/12 Potential to Achieve Goals: Good Pt will go Supine/Side to Sit: with HOB 0 degrees;Other (comment);Independently PT Goal: Supine/Side to Sit - Progress: Met Pt will go Sit to Stand: with upper extremity assist;Independently PT Goal: Sit to Stand - Progress: Progressing toward goal Pt will go Stand to Sit: with upper extremity assist;Independently PT Goal: Stand to Sit - Progress: Progressing toward goal Pt will Ambulate: >150 feet;with least restrictive assistive device;Other (comment);Independently PT Goal: Ambulate - Progress: Progressing toward goal Additional Goals Additional Goal #1: Pt will verbalize and adhere to hip precautions while mobilizing PT Goal: Additional Goal #1 - Progress: Met  Visit Information  Last PT Received On: 09/01/12 Assistance Needed: +1 PT/OT Co-Evaluation/Treatment: Yes    Subjective Data  Subjective: I am ready to go home, I just have to move slowly Patient Stated Goal: return home   Cognition  Overall Cognitive Status: Impaired Area of Impairment: Other (comment) Arousal/Alertness: Awake/alert Orientation Level: Appears intact for tasks assessed Behavior During Session: [facility] for tasks performed Cognition - Other  Comments: pt cognition appears WFL throughout treatment until  her BP dropped at end of ambulation at which point she became slightly confused and began to repeat herself.  She noted that "I just don't feel myself". Educated her that when she begins to have this feeling that it is likely that her pressure has dropped and that she needs to cease activity    Balance  Balance Balance Assessed: Yes Dynamic Standing Balance Dynamic Standing - Balance Support: Right upper extremity supported;During functional activity Dynamic Standing - Level of Assistance: 6: Modified independent (Device/Increase time)  End of Session PT - End of Session Equipment Utilized During Treatment: Gait belt Activity Tolerance: Patient tolerated treatment well Patient left: in chair;with call bell/phone within reach Nurse Communication: Other (comment) (BP)   GP Functional Assessment Tool Used: clinical judgment Functional Limitation: Mobility: Walking and moving around Mobility: Walking and Moving Around Current Status ([id number]): At least 1 percent but less than 20 percent impaired, limited or restricted Mobility: Walking and Moving Around Goal Status [phone]): At least 1 percent but less than 20 percent impaired, limited or restricted  Lyanne Co, PT  Acute Rehab Services  [phone]  Lyanne Co 09/01/2012, 10:13 AM

## 2012-09-01 NOTE — Progress Notes (Signed)
Occupational Therapy Treatment and Discharge Patient Details Name: Madeline Serrano MRN: [id number] DOB: 30-Jun-1930 Today's Date: 09/01/2012 Time: 4098-1191 OT Time Calculation (min): 26 min  OT Assessment / Plan / Recommendation Comments on Treatment Session Pt's primary limitation is still orthostatic HTN, however it is getting a bit better (she can be up longer before she starts feeling funny).    Follow Up Recommendations  Home health OT;Supervision/Assistance - 24 hour       Equipment Recommendations  3 in 1 bedside comode;Rolling walker with 5" wheels       Frequency Min 2X/week   Plan Discharge plan remains appropriate    Precautions / Restrictions Precautions Precautions: Fall;Posterior Hip Precaution Comments: Watch BP, pt verbalized 3/3 hip precautions Restrictions Weight Bearing Restrictions: Yes RLE Weight Bearing: Weight bearing as tolerated Other Position/Activity Restrictions: Fluctuating BP   Pertinent Vitals/Pain Pt did not c/o of dizziness or nausea today, however she did state that she felt funny/not right (while standing after having ambulated, BP taken and there was a significant drop (160's to 110's)    ADL  Toilet Transfer: Performed;Supervision/safety Toilet Transfer Method: Sit to Barista: Comfort height toilet;Grab bars Toileting - Architect and Hygiene: Performed;Supervision/safety Where Assessed - Engineer, mining and Hygiene: Standing Transfers/Ambulation Related to ADLs: Supervision with RW ADL Comments: Pt able to state 3/3 hip precautions. She is supposed to tell nurse to get in touch with OT/PT when he gets here if he has any questions.      OT Goals Miscellaneous OT Goals OT Goal: Miscellaneous Goal #1 - Progress: Progressing toward goals  Visit Information  Last OT Received On: 09/01/12 Assistance Needed: +1 (+1 to follow with chair due to BP issues) PT/OT Co-Evaluation/Treatment: Yes   Subjective Data  Subjective: I don't feel as bad as I have been, just a little funny (after being up about 5 minutes on her feet)      Cognition  Overall Cognitive Status: Impaired Area of Impairment: Other (comment) Arousal/Alertness: Awake/alert Orientation Level: Appears intact for tasks assessed Behavior During Session: [facility] for tasks performed Cognition - Other Comments: pt cognition appears WFL throughout treatment until her BP dropped at end of ambulation at which point she became slightly confused and began to repeat herself.  She noted that "I just don't feel myself". Educated her that when she begins to have this feeling that it is likely that her pressure has dropped and that she needs to cease activity    Mobility   Bed Mobility Bed Mobility: Supine to Sit Supine to Sit: 7: Independent Sitting - Scoot to Edge of Bed: 7: Independent Transfers Sit to Stand: 6: Modified independent (Device/Increase time);From bed Stand to Sit: 6: Modified independent (Device/Increase time);To chair/3-in-1 Details for Transfer Assistance: verbal clue to remember hand placement which she did. After standing, pt reports that she has a hard time remembering to stand up slowly, encouraged to pause once standing to ask herself if she feels alright before beginning to ambulate          Balance Balance Balance Assessed: Yes Dynamic Standing Balance Dynamic Standing - Balance Support: Right upper extremity supported;During functional activity Dynamic Standing - Level of Assistance: 6: Modified independent (Device/Increase time)   End of Session OT - End of Session Equipment Utilized During Treatment: Gait belt (RW) Activity Tolerance: Patient tolerated treatment well Patient left: in chair;with call bell/phone within reach  GO Functional Assessment Tool Used: Clincial reasoning Functional Limitation: Self care Self Care Discharge  Status [phone]): At least 1 percent but less than 20 percent  impaired, limited or restricted   [name] [phone] 09/01/2012, 11:31 AM

## 2012-09-07 ENCOUNTER — Encounter (HOSPITAL_COMMUNITY): Payer: Self-pay | Admitting: Emergency Medicine

## 2012-09-07 ENCOUNTER — Emergency Department (HOSPITAL_COMMUNITY): Payer: Medicare Other

## 2012-09-07 ENCOUNTER — Emergency Department (HOSPITAL_COMMUNITY)
Admission: EM | Admit: 2012-09-07 | Discharge: 2012-09-07 | Disposition: A | Discharge status: Home / Self Care | Payer: Medicare Other | Attending: Emergency Medicine | Admitting: Emergency Medicine

## 2012-09-07 DIAGNOSIS — E860 Dehydration: Secondary | ICD-10-CM

## 2012-09-07 DIAGNOSIS — R4789 Other speech disturbances: Secondary | ICD-10-CM

## 2012-09-07 DIAGNOSIS — N390 Urinary tract infection, site not specified: Secondary | ICD-10-CM

## 2012-09-07 DIAGNOSIS — Z8679 Personal history of other diseases of the circulatory system: Secondary | ICD-10-CM | POA: Insufficient documentation

## 2012-09-07 DIAGNOSIS — E7800 Pure hypercholesterolemia, unspecified: Secondary | ICD-10-CM | POA: Insufficient documentation

## 2012-09-07 DIAGNOSIS — I10 Essential (primary) hypertension: Secondary | ICD-10-CM | POA: Insufficient documentation

## 2012-09-07 DIAGNOSIS — Z7982 Long term (current) use of aspirin: Secondary | ICD-10-CM | POA: Insufficient documentation

## 2012-09-07 DIAGNOSIS — R471 Dysarthria and anarthria: Secondary | ICD-10-CM | POA: Insufficient documentation

## 2012-09-07 DIAGNOSIS — Z79899 Other long term (current) drug therapy: Secondary | ICD-10-CM | POA: Insufficient documentation

## 2012-09-07 DIAGNOSIS — Z8673 Personal history of transient ischemic attack (TIA), and cerebral infarction without residual deficits: Secondary | ICD-10-CM | POA: Insufficient documentation

## 2012-09-07 DIAGNOSIS — R479 Unspecified speech disturbances: Secondary | ICD-10-CM

## 2012-09-07 DIAGNOSIS — I951 Orthostatic hypotension: Secondary | ICD-10-CM

## 2012-09-07 LAB — POCT I-STAT, CHEM 8
Calcium, Ion: 1.25 mmol/L (ref 1.13–1.30)
Creatinine, Ser: 1.2 mg/dL — ABNORMAL HIGH (ref 0.50–1.10)
Glucose, Bld: 129 mg/dL — ABNORMAL HIGH (ref 70–99)
HCT: 35 % — ABNORMAL LOW (ref 36.0–46.0)
Hemoglobin: 11.9 g/dL — ABNORMAL LOW (ref 12.0–15.0)

## 2012-09-07 LAB — COMPREHENSIVE METABOLIC PANEL
ALT: 27 U/L (ref 0–35)
AST: 33 U/L (ref 0–37)
Albumin: 3.4 g/dL — ABNORMAL LOW (ref 3.5–5.2)
Alkaline Phosphatase: 93 U/L (ref 39–117)
CO2: 24 mEq/L (ref 19–32)
Chloride: 92 mEq/L — ABNORMAL LOW (ref 96–112)
GFR calc non Af Amer: 45 mL/min — ABNORMAL LOW (ref >90)
Potassium: 4.3 mEq/L (ref 3.5–5.1)
Sodium: 127 mEq/L — ABNORMAL LOW (ref 135–145)
Total Bilirubin: 0.6 mg/dL (ref 0.3–1.2)

## 2012-09-07 LAB — URINALYSIS, ROUTINE W REFLEX MICROSCOPIC
Ketones, ur: NEGATIVE mg/dL
Nitrite: NEGATIVE
Specific Gravity, Urine: 1.008 (ref 1.005–1.030)
Urobilinogen, UA: 0.2 mg/dL (ref 0.0–1.0)
pH: 6 (ref 5.0–8.0)

## 2012-09-07 LAB — RAPID URINE DRUG SCREEN, HOSP PERFORMED
Barbiturates: NOT DETECTED
Cocaine: NOT DETECTED
Opiates: NOT DETECTED

## 2012-09-07 LAB — URINE MICROSCOPIC-ADD ON

## 2012-09-07 LAB — CBC
HCT: 35.6 % — ABNORMAL LOW (ref 36.0–46.0)
Platelets: 203 10*3/uL (ref 150–400)
RDW: 14.8 % (ref 11.5–15.5)
WBC: 7.1 10*3/uL (ref 4.0–10.5)

## 2012-09-07 LAB — DIFFERENTIAL
Basophils Absolute: 0.1 10*3/uL (ref 0.0–0.1)
Basophils Relative: 1 % (ref 0–1)
Lymphocytes Relative: 20 % (ref 12–46)
Monocytes Absolute: 0.7 10*3/uL (ref 0.1–1.0)
Neutro Abs: 4.5 10*3/uL (ref 1.7–7.7)
Neutrophils Relative %: 64 % (ref 43–77)

## 2012-09-07 LAB — PROTIME-INR: INR: 2.08 — ABNORMAL HIGH (ref 0.00–1.49)

## 2012-09-07 LAB — APTT: aPTT: 41 seconds — ABNORMAL HIGH (ref 24–37)

## 2012-09-07 MED ORDER — CIPROFLOXACIN HCL 500 MG PO TABS: 500.0000 mg | ORAL_TABLET | Freq: Two times a day (BID) | ORAL | Status: DC

## 2012-09-07 MED ORDER — CIPROFLOXACIN IN D5W 400 MG/200ML IV SOLN
400.0000 mg | Freq: Once | INTRAVENOUS | Status: AC
  Administered 2012-09-07: 400 mg via INTRAVENOUS
  Filled 2012-09-07: qty 200

## 2012-09-07 MED ORDER — SODIUM CHLORIDE 0.9 % IV BOLUS (SEPSIS)
1000.0000 mL | Freq: Once | INTRAVENOUS | Status: AC
  Administered 2012-09-07: 1000 mL via INTRAVENOUS

## 2012-09-07 NOTE — ED Notes (Signed)
Per EMS pt c/o of slow speech. Pt had a headache and lightheadedness last night and spent most of the day in bed. This morning she still was not feeling well. Per EMS pt speech seems normal, facial symmetry normal, no stroke like symptoms. Pt seems anxious.

## 2012-09-07 NOTE — Consult Note (Addendum)
Referring Physician: Rhunette Croft    Chief Complaint: dysarthria  HPI:                                                                                                                                         Madeline Serrano is an 78 y.o. female who was recently was discharged 09/01/12 from [facility] hospital after thoughout workup for TIA symptoms of dysarthria and word finding difficulties. At time of discharge patients symptoms were thought to be related to hypoperfusion associated with positional changes. Both Avapro and Maxide were discontinued. Patient has history of Afib and is currently on both coumadin and ASA. Patient returns to the hospital today for onset of slurred speech noted at 8:10 AM.  Per patient over the last 48 hours she has not felt well. Yesterday patient had a slight HA and felt generally weak.  This AM she continued to feel dizzy and weak.  She was on the phone at 8:10 with her daughter when she felt her speech was more slurred and daughter felt it was taking longer for her to get her words out. Patient took her BP which was noted to be asystolically in the 200's.  EMS was called and patient was brought to ED.  Upon arriving her speech was felt to have improved, patient showed no lateralizing weakness or sensory deficit. Patients INR was 2.08 and CT head showed no acute infarct.    LSN: 8:10 AM tPA Given: No: INR 2.08, resolved symptoms  Past Medical History  Diagnosis Date  . A-fib   . Hypertension   . High cholesterol   . Macular degeneration     Past Surgical History  Procedure Date  . Hip arthroplasty 06/30/2012    Procedure: ARTHROPLASTY BIPOLAR HIP;  Surgeon: Shelda Pal, MD;  Location: [facility] OR;  Service: Orthopedics;  Laterality: Right;  right hip hemiarthroplasty    History reviewed.  Mother -emphysema Father-CAD,MI, DM Siblings-none   Social History:  reports that she has never smoked. She does not have any smokeless tobacco history on file. She reports that she does  not drink alcohol or use illicit drugs.  Allergies:  Allergies  Allergen Reactions  . Sulfa Antibiotics Other (See Comments)    [name]  . Celebrex (Celecoxib) Swelling  . Clarithromycin Swelling  . Statins Other (See Comments)    Hair fell out  . Vioxx (Rofecoxib) Other (See Comments)    Renal problems  . Penicillins Rash    Medications:  Current Facility-Administered Medications  Medication Dose Route Frequency Provider Last Rate Last Dose  . sodium chloride 0.9 % bolus 1,000 mL  1,000 mL Intravenous Once Elwin Mocha, MD       Current Outpatient Prescriptions  Medication Sig Dispense Refill  . aspirin 81 MG chewable tablet Chew 1 tablet (81 mg total) by mouth daily.      . calcitonin, salmon, (MIACALCIN/FORTICAL) 200 UNIT/ACT nasal spray Place 1 spray into the nose daily. Alternate nostrils      . calcium-vitamin D (OSCAL WITH D) 500-200 MG-UNIT per tablet Take 1 tablet by mouth 3 (three) times daily.      . carvedilol (COREG) 12.5 MG tablet Take 12.5 mg by mouth 2 (two) times daily with a meal.      . digoxin (LANOXIN) 0.125 MG tablet Take 0.125 mg by mouth daily.      . diphenhydramine-acetaminophen (TYLENOL PM EXTRA STRENGTH) 25-500 MG TABS Take 1-2 tablets by mouth at bedtime as needed. For sleep      . flecainide (TAMBOCOR) 50 MG tablet Take 50 mg by mouth 2 (two) times daily.        . lansoprazole (PREVACID) 15 MG capsule Take 15 mg by mouth daily.      . Multiple Vitamin (MULTIVITAMIN WITH MINERALS) TABS Take 1 tablet by mouth daily.      . Multiple Vitamins-Minerals (ICAPS) TABS Take 2 tablets by mouth 2 (two) times daily.      . rosuvastatin (CRESTOR) 10 MG tablet Take 5 mg by mouth every Monday, Wednesday, and Friday.       . tiotropium (SPIRIVA) 18 MCG inhalation capsule Place 18 mcg into inhaler and inhale daily.      Marland Kitchen warfarin  (COUMADIN) 2 MG tablet Take 1 tablet (2 mg total) by mouth daily. Tue, wed, thur, sat  30 tablet  1    ROS:                                                                                                                                       History obtained from the patient  General ROS: positive for - generalized weakness Psychological ROS: negative for - behavioral disorder, hallucinations, memory difficulties, mood swings or suicidal ideation Ophthalmic ROS: negative for - blurry vision, double vision, eye pain or loss of vision ENT ROS: negative for - epistaxis, nasal discharge, oral lesions, sore throat, tinnitus or vertigo Allergy and Immunology ROS: negative for - hives or itchy/watery eyes Hematological and Lymphatic ROS: negative for - bleeding problems, bruising or swollen lymph nodes Endocrine ROS: negative for - galactorrhea, hair pattern changes, polydipsia/polyuria or temperature intolerance Respiratory ROS: negative for - cough, hemoptysis, shortness of breath or wheezing Cardiovascular ROS: negative for - chest pain, dyspnea on exertion, edema or irregular heartbeat Gastrointestinal ROS: negative for - abdominal pain, diarrhea, hematemesis, nausea/vomiting or stool incontinence Genito-Urinary ROS: negative for - dysuria, hematuria, incontinence  or urinary frequency/urgency Musculoskeletal ROS: positive for - weakness Neurological ROS: as noted in HPI Dermatological ROS: negative for rash and skin lesion changes  Neurologic Examination:                                                                                                      Blood pressure 182/62, pulse 69, temperature 97.7 F (36.5 C), temperature source Oral, resp. rate 17, SpO2 98.00%.  Mental Status: Alert, oriented, thought content appropriate.  Speech fluent without evidence of aphasia.  Patients mouth is very dry during exam. Able to follow 3 step commands without difficulty. Cranial Nerves: II: Discs  flat bilaterally; Visual fields grossly normal, pupils equal, round, reactive to light and accommodation III,IV, VI: ptosis not present, extra-ocular motions intact bilaterally V,VII: smile symmetric w/decreased left NLF, facial light touch sensation normal bilaterally VIII: hearing decreased bilaterally IX,X: gag reflex present XI: bilateral shoulder shrug XII: midline tongue extension Motor: Right : Upper extremity   5/5    Left:     Upper extremity   5/5  Lower extremity   5/5     Lower extremity   5/5 Tone and bulk:normal tone throughout; no atrophy noted Sensory: Pinprick and light touch intact throughout, bilaterally Deep Tendon Reflexes: 2+ UE and bilateral KJ and symmetric throughout. No AJ noted Plantars: Right: downgoing   Left: downgoing Cerebellar: normal finger-to-nose,  normal heel-to-shin test CV: pulses palpable throughout     Results for orders placed during the hospital encounter of 09/07/12 (from the past 48 hour(s))  TROPONIN I     Status: Normal   Collection Time   09/07/12 10:15 AM      Component Value Range Comment   Troponin I <0.30  <0.30 ng/mL   PROTIME-INR     Status: Abnormal   Collection Time   09/07/12 10:15 AM      Component Value Range Comment   Prothrombin Time 22.5 (*) 11.6 - 15.2 seconds    INR 2.08 (*) 0.00 - 1.49   APTT     Status: Abnormal   Collection Time   09/07/12 10:15 AM      Component Value Range Comment   aPTT 41 (*) 24 - 37 seconds   CBC     Status: Abnormal   Collection Time   09/07/12 10:15 AM      Component Value Range Comment   WBC 7.1  4.0 - 10.5 K/uL    RBC 3.92  3.87 - 5.11 MIL/uL    Hemoglobin 12.3  12.0 - 15.0 g/dL    HCT 16.1 (*) 09.6 - 46.0 %    MCV 90.8  78.0 - 100.0 fL    MCH 31.4  26.0 - 34.0 pg    MCHC 34.6  30.0 - 36.0 g/dL    RDW 04.5  40.9 - 81.1 %    Platelets 203  150 - 400 K/uL   DIFFERENTIAL     Status: Normal   Collection Time   09/07/12 10:15 AM      Component Value Range Comment   Neutrophils  Relative 64  43 - 77 %    Neutro Abs 4.5  1.7 - 7.7 K/uL    Lymphocytes Relative 20  12 - 46 %    Lymphs Abs 1.4  0.7 - 4.0 K/uL    Monocytes Relative 11  3 - 12 %    Monocytes Absolute 0.7  0.1 - 1.0 K/uL    Eosinophils Relative 4  0 - 5 %    Eosinophils Absolute 0.3  0.0 - 0.7 K/uL    Basophils Relative 1  0 - 1 %    Basophils Absolute 0.1  0.0 - 0.1 K/uL   POCT I-STAT, CHEM 8     Status: Abnormal   Collection Time   09/07/12 10:19 AM      Component Value Range Comment   Sodium 128 (*) 135 - 145 mEq/L    Potassium 4.4  3.5 - 5.1 mEq/L    Chloride 96  96 - 112 mEq/L    BUN 19  6 - 23 mg/dL    Creatinine, Ser 1.91 (*) 0.50 - 1.10 mg/dL    Glucose, Bld 478 (*) 70 - 99 mg/dL    Calcium, Ion 2.95  6.21 - 1.30 mmol/L    TCO2 23  0 - 100 mmol/L    Hemoglobin 11.9 (*) 12.0 - 15.0 g/dL    HCT 30.8 (*) 65.7 - 46.0 %    MRA BRAIN 08/26/12 IMPRESSION:  1. Atherosclerotic nondominant distal left vertebral artery  occludes proximal to the basilar artery.  2. The basilar artery is therefore supplied by the right vertebral  and is patent without stenosis.  3. The remaining posterior circulation is within normal limit.  4. Bilateral supraclinoid ICA atherosclerosis and mild stenosis.  Patent carotid termini.  5. Atherosclerotic and diminutive left MCA M1 segment, but no  major MCA branch occlusion or focal high-grade stenosis identified.  6. Remaining anterior circulation within normal limits  MRI brain 08/26/12 IMPRESSION:  Moderate atrophy. Mild white matter changes.  No acute stroke or acute hemorrhage. No abnormal intracranial  enhancement.   Carotid doppler 08/25/12 Summary:  - Bilaterally <40% internal carotid artery stenosis with antegrade vertebral flow. - Vertebrals have antegrade flow.  2-D echo 08/25/12  Study Conclusions  - Left ventricle: The cavity size was normal. There was moderate focal basal hypertrophy of the septum. Systolic function was normal. The  estimated ejection fraction was in the range of 60% to 65%. Wall motion was normal; there were no regional wall motion abnormalities. Doppler parameters are consistent with abnormal left ventricular relaxation (grade 1 diastolic dysfunction). - Aortic valve: There was mild stenosis. Trivial regurgitation. Valve area: 2.12cm^2(VTI). Valve area: 2.07cm^2 (Vmax). - Mitral valve: Calcified annulus. - Left atrium: The atrium was moderately dilated     Ct Head Wo Contrast  09/07/2012  *RADIOLOGY REPORT*  Clinical Data: Slurred speech.  Aphasia.  CT HEAD WITHOUT CONTRAST  Technique:  Contiguous axial images were obtained from the base of the skull through the vertex without contrast.  Comparison: 08/26/2012 head CT.  08/24/2012 brain MR.  Findings: No intracranial hemorrhage.  Small vessel disease type changes without CT evidence of large acute infarct.  Global atrophy without hydrocephalus.  No intracranial mass lesion detected on this unenhanced exam.  Empty sella once again noted and most likely incidental finding.  Vascular calcifications.  Partial opacification superior aspect left maxillary sinus with wall thickening suggesting changes of chronic sinusitis.  IMPRESSION: Small vessel disease type changes without CT evidence of large  acute infarct.  No intracranial hemorrhage.  Left maxillary sinus opacification.  Present examination was a code stroke with Dr. Thad Ranger contacted 09/07/2012 10:25 a.m.   Original Report Authenticated By: Lacy Duverney, M.D.    Felicie Morn PA-C [facility] [phone]  09/07/2012, 11:04 AM  Patient seen and examined.  Clinical course and management discussed.  Necessary edits performed.  I agree with the above.  Assessment and plan of care developed and discussed below.     Assessment: 78 y.o. female presenting with slurred speech.  Symptoms have now resolved.  Presentation very similar to the symptoms she had while in the hospital that were felt to be  secondary to hypoperfusion.  Head CT today reviewed and unremarkable.  Again stroke less likely.  BP issues are continuing to be addressed by her PCP.  Patient also appears dehydrated clinically and by review of lab work.  This may be contributing to her symptoms as well.    Stroke Risk Factors - carotid stenosis, hyperlipidemia and hypertension  Plan: 1. Patient may benefit from a fluid bolus.   2. Continued BP management 3.  Patient has had a recent stroke work up.  Would not repeat at this time.  Case discussed with resident managing case with Dr. Carver Fila, MD [facility] [phone]  09/07/2012  12:14 PM

## 2012-09-07 NOTE — ED Notes (Signed)
MD at bedside. 

## 2012-09-07 NOTE — ED Notes (Signed)
Per Pt she feels like she has improved since this morning. States she felt "peculiar" and her speech was difficult and slower than normal. Pt states she has had 2 TIA's and was concerned she was having another one so she called ems.

## 2012-09-07 NOTE — ED Notes (Signed)
Code stroke called at 1002 Pt arrival 0920 EDP exam [phone] Stroke team arrival 1008 Last seen normal 0810 Pt arrival in CT 1010 Phlebotomist arrival 1005

## 2012-09-07 NOTE — ED Notes (Signed)
Pt returned to room 18 with stroke team. Dr. Thad Ranger at bedside.

## 2012-09-07 NOTE — ED Provider Notes (Signed)
History     CSN: [id number]  Arrival date & time 09/07/12  0919   First MD Initiated Contact with Patient 09/07/12 [phone]      Chief Complaint  Patient presents with  . Aphasia    (Consider location/radiation/quality/duration/timing/severity/associated sxs/prior treatment) Patient is a 78 y.o. female presenting with neurologic complaint. The history is provided by the patient.  Neurologic Problem The primary symptoms include speech change (dysarthria). Primary symptoms do not include fever, nausea or vomiting. The symptoms began 1 to 2 hours ago. The symptoms are unchanged. The neurological symptoms are diffuse. Context: after waking up.  Additional symptoms do not include neck stiffness, pain or lower back pain. Medical issues also include cerebral vascular accident (TIAs recently). Medical issues do not include cancer. Workup history includes MRI.    Past Medical History  Diagnosis Date  . A-fib   . Hypertension   . High cholesterol   . Macular degeneration     Past Surgical History  Procedure Date  . Hip arthroplasty 06/30/2012    Procedure: ARTHROPLASTY BIPOLAR HIP;  Surgeon: Shelda Pal, MD;  Location: [facility];  Service: Orthopedics;  Laterality: Right;  right hip hemiarthroplasty    History reviewed. No pertinent family history.  History  Substance Use Topics  . Smoking status: Never Smoker   . Smokeless tobacco: Not on file  . Alcohol Use: No    OB History    Grav Para Term Preterm Abortions TAB SAB Ect Mult Living                  Review of Systems  Constitutional: Negative for fever.  HENT: Negative for neck stiffness.   Respiratory: Negative for cough and shortness of breath.   Gastrointestinal: Negative for nausea and vomiting.  Neurological: Positive for speech change (dysarthria).  All other systems reviewed and are negative.    Allergies  Sulfa antibiotics; Celebrex; Clarithromycin; Statins; Vioxx; and Penicillins  Home Medications    Current Outpatient Rx  Name  Route  Sig  Dispense  Refill  . ASPIRIN 81 MG PO CHEW   Oral   Chew 1 tablet (81 mg total) by mouth daily.         Marland Kitchen CALCITONIN (SALMON) 200 UNIT/ACT NA SOLN   Nasal   Place 1 spray into the nose daily. Alternate nostrils         . CALCIUM CARBONATE-VITAMIN D 500-200 MG-UNIT PO TABS   Oral   Take 1 tablet by mouth 3 (three) times daily.         Marland Kitchen CARVEDILOL 12.5 MG PO TABS   Oral   Take 1 tablet (12.5 mg total) by mouth 2 (two) times daily with a meal.   60 tablet   3   . DIGOXIN 0.125 MG PO TABS   Oral   Take 0.125 mg by mouth daily.         Marland Kitchen DOCUSATE SODIUM 100 MG PO CAPS   Oral   Take 100 mg by mouth 2 (two) times daily.         Marland Kitchen FLECAINIDE ACETATE 50 MG PO TABS   Oral   Take 50 mg by mouth 2 (two) times daily.           Marland Kitchen HYDROCODONE-ACETAMINOPHEN 5-325 MG PO TABS   Oral   Take 1-2 tablets by mouth every 4 (four) hours as needed. For pain. 1 tablet for moderate pain and 2 tablets for severe pain         .  POLYSACCHARIDE IRON COMPLEX 150 MG PO CAPS   Oral   Take 1 capsule (150 mg total) by mouth daily.   30 capsule   3   . LANSOPRAZOLE 30 MG PO CPDR   Oral   Take 30 mg by mouth daily.         . ADULT MULTIVITAMIN W/MINERALS CH   Oral   Take 1 tablet by mouth daily.         . ICAPS AREDS FORMULA PO TABS   Oral   Take 2 tablets by mouth 2 (two) times daily.         Marland Kitchen POLYETHYLENE GLYCOL 3350 PO PACK   Oral   Take 17 g by mouth daily as needed.   14 each      . ROSUVASTATIN CALCIUM 10 MG PO TABS   Oral   Take 5 mg by mouth every Monday, Wednesday, and Friday.          Marland Kitchen TIOTROPIUM BROMIDE MONOHYDRATE 18 MCG IN CAPS   Inhalation   Place 18 mcg into inhaler and inhale daily.         . TRAZODONE HCL 50 MG PO TABS   Oral   Take 50 mg by mouth at bedtime as needed. For insomnia         . WARFARIN SODIUM 2 MG PO TABS   Oral   Take 1 tablet (2 mg total) by mouth daily. Tue, wed, thur, sat    30 tablet   1     BP 167/59  Pulse 69  Temp 97.7 F (36.5 C) (Oral)  Resp 20  SpO2 98%  Physical Exam  Nursing note and vitals reviewed. Constitutional: She is oriented to person, place, and time. She appears well-developed and well-nourished. No distress.  HENT:  Head: Normocephalic and atraumatic.  Eyes: EOM are normal. Pupils are equal, round, and reactive to light.  Neck: Normal range of motion. Neck supple.  Cardiovascular: Normal rate and regular rhythm.  Exam reveals no friction rub.   No murmur heard. Pulmonary/Chest: Effort normal and breath sounds normal. No respiratory distress. She has no wheezes. She has no rales.  Abdominal: Soft. She exhibits no distension. There is no tenderness. There is no rebound.  Musculoskeletal: Normal range of motion. She exhibits no edema.  Neurological: She is alert and oriented to person, place, and time. She displays tremor (Mild, R arm). A cranial nerve deficit (Mild tongue deviation to the right, dysarthria) is present. No sensory deficit. She exhibits normal muscle tone. Coordination and gait normal. GCS eye subscore is 4. GCS verbal subscore is 5. GCS motor subscore is 6.  Skin: She is not diaphoretic.    ED Course  Procedures (including critical care time)  Labs Reviewed  URINALYSIS, ROUTINE W REFLEX MICROSCOPIC - Abnormal; Notable for the following:    APPearance CLOUDY (*)     Hgb urine dipstick TRACE (*)     Leukocytes, UA LARGE (*)     All other components within normal limits  PROTIME-INR - Abnormal; Notable for the following:    Prothrombin Time 22.5 (*)     INR 2.08 (*)     All other components within normal limits  APTT - Abnormal; Notable for the following:    aPTT 41 (*)     All other components within normal limits  CBC - Abnormal; Notable for the following:    HCT 35.6 (*)     All other components within normal limits  COMPREHENSIVE  METABOLIC PANEL - Abnormal; Notable for the following:    Sodium 127 (*)      Chloride 92 (*)     Glucose, Bld 130 (*)     Creatinine, Ser 1.12 (*)     Albumin 3.4 (*)     GFR calc non Af Amer 45 (*)     GFR calc Af Amer 52 (*)     All other components within normal limits  POCT I-STAT, CHEM 8 - Abnormal; Notable for the following:    Sodium 128 (*)     Creatinine, Ser 1.20 (*)     Glucose, Bld 129 (*)     Hemoglobin 11.9 (*)     HCT 35.0 (*)     All other components within normal limits  URINE MICROSCOPIC-ADD ON - Abnormal; Notable for the following:    Squamous Epithelial / LPF FEW (*)     Bacteria, UA MANY (*)     All other components within normal limits  TROPONIN I  URINE RAPID DRUG SCREEN (HOSP PERFORMED)  DIFFERENTIAL  SODIUM, URINE, RANDOM  URINE CULTURE   Ct Head Wo Contrast  09/07/2012  *RADIOLOGY REPORT*  Clinical Data: Slurred speech.  Aphasia.  CT HEAD WITHOUT CONTRAST  Technique:  Contiguous axial images were obtained from the base of the skull through the vertex without contrast.  Comparison: 08/26/2012 head CT.  08/24/2012 brain MR.  Findings: No intracranial hemorrhage.  Small vessel disease type changes without CT evidence of large acute infarct.  Global atrophy without hydrocephalus.  No intracranial mass lesion detected on this unenhanced exam.  Empty sella once again noted and most likely incidental finding.  Vascular calcifications.  Partial opacification superior aspect left maxillary sinus with wall thickening suggesting changes of chronic sinusitis.  IMPRESSION: Small vessel disease type changes without CT evidence of large acute infarct.  No intracranial hemorrhage.  Left maxillary sinus opacification.  Present examination was a code stroke with Dr. Thad Ranger contacted 09/07/2012 10:25 a.m.   Original Report Authenticated By: Lacy Duverney, M.D.      1. Speech abnormality   2. Dehydration   3. Orthostasis   4. UTI (lower urinary tract infection)      Date: 09/07/2012  Rate: 70  Rhythm: normal sinus rhythm  QRS Axis: normal   Intervals: PR prolonged  ST/T Wave abnormalities: normal  Conduction Disutrbances:first-degree A-V block   Narrative Interpretation:   Old EKG Reviewed: changes noted - previously in Aflutter/Afib    MDM   Patient is an 78 year old female history of recent TIA who presents with dysarthria. It began this morning around 810. As her last seen normal per her. Daughter-in-law at the bedside states she is not speaking like her normal. Patient states she felt very tender when she woke up this morning, however is feeling improved and she is taken off her blood pressure and bradycardia medications. Here her vitals are stable. Blood pressure is 160/90. On exam patient has mild dysarthria, however no expressive aphasia. She is mild to deviation to the right. No other cranial nerve deficits. Good strength and sensation in all extremities. Coordination is normal. Patient is able to get to walk, however uses a walker normally, however is not ataxic. Code stroke activated since last seen normal as an hour and 45 minutes ago. Stroke team does not feel she is having an acute stroke. They feel that with the stenotic lesions in her brain and her history of orthostasis  she sometimes get some neurologic deficits when she  is orthostatic. Her labs show some mild stable hyponatremia. I spoke with her PCP, Dr. Clelia Croft, who recently discharged her after her TIA workup. He states this is likely her normal with her severe orthostasis. He states that she has been getting these deficit several times with her orthostasis. He recommended to her Coreg and half (from 12.5 mg BID to 6.25 mg twice a day) and follow up tomorrow. I explained this to the patient and the family member who are comfortable with this plan.  Dr. Clelia Croft also made aware of patient's UTI, recommended Cipro and f/u tomorrow. Patient given initial dose of Cipro IV here. Stable for discharge.      Elwin Mocha, MD 09/07/12 [phone]

## 2012-09-07 NOTE — ED Notes (Signed)
Dr.Walden at the bedside.  

## 2012-09-07 NOTE — ED Notes (Signed)
Per Pt's daughter in law her speech is slower than usual.

## 2012-09-07 NOTE — ED Provider Notes (Signed)
I saw and evaluated the patient, reviewed the resident's note and I agree with the findings and plan.  Pt comes in with cc of speech problems. Hx of TIA. Neurology consulted as acute stroke, as patient's sx onset was 8:00 am. They cleared the patient from Neuro point of view. Pt also has a UTI. Had a recent admission, so health care acquired UTI - PCP called, and he feels comfortable with outpatient followup for both active issues and made some med changes - which were communicated to the patient.  Derwood Kaplan, MD 09/07/12 [phone]

## 2012-09-09 LAB — URINE CULTURE

## 2013-03-09 ENCOUNTER — Ambulatory Visit (INDEPENDENT_AMBULATORY_CARE_PROVIDER_SITE_OTHER): Payer: Medicare Other | Admitting: Pharmacist Clinician (PhC)/ Clinical Pharmacy Specialist

## 2013-03-09 ENCOUNTER — Encounter: Payer: Self-pay | Admitting: Pharmacist Clinician (PhC)/ Clinical Pharmacy Specialist

## 2013-03-09 VITALS — BP 154/70 | HR 72

## 2013-03-09 DIAGNOSIS — I10 Essential (primary) hypertension: Secondary | ICD-10-CM

## 2013-03-09 NOTE — Patient Instructions (Signed)
Your blood pressure today is okay at 172/80 (sitting) and 154/70 (standing). Check your blood pressure at home daily (if able) and keep record of the readings.  Take your BP meds as follows:  AM: carvedilol 6.25mg    PM: carvedilol 12.5mg    Bring all of your meds, your BP cuff and your record of home blood pressures to your next appointment.  Exercise as you're able, try to walk approximately 30 minutes per day.  Keep salt intake to a minimum, especially watch canned and prepared boxed foods.  Eat more fresh fruits and vegetables and fewer canned items.  Avoid eating in fast food restaurants.

## 2013-03-09 NOTE — Progress Notes (Signed)
HPI:  Madeline Serrano is a 79 y.o. female with a PMH below who presents today for a routine follow up blood pressure check.  Per patient she has been told by multiple MDs that her BP should always be in the 140-160 range systolic due to history of TIAs and orthostatic hypotension.  She has multiple medication sensitivities, including the "time release component" of meds, and is reluctant to try different meds.  She checks her BP several days per week, both sitting and standing.  Sitting numbers range from 135-180 systolic, with most readings in the 150-160s,  Diastolically she is fairly consistent in the 68-80 range.  Her standing numbers are slightly lower systolically, usually 10-20 points.  She has recentlly discontinued Crestor due to myalgias and was given a prescription for Livalo 1mg , although she has not started it at this time.     Current Outpatient Prescriptions  Medication Sig Dispense Refill  . Pitavastatin Calcium (LIVALO) 1 MG TABS Take 1 mg by mouth daily.      Marland Kitchen aspirin 81 MG chewable tablet Chew 1 tablet (81 mg total) by mouth daily.      . calcitonin, salmon, (MIACALCIN/FORTICAL) 200 UNIT/ACT nasal spray Place 1 spray into the nose daily. Alternate nostrils      . calcium-vitamin D (OSCAL WITH D) 500-200 MG-UNIT per tablet Take 1 tablet by mouth 3 (three) times daily.      . carvedilol (COREG) 12.5 MG tablet Take 12.5 mg by mouth 2 (two) times daily with a meal.      . ciprofloxacin (CIPRO) 500 MG tablet Take 1 tablet (500 mg total) by mouth every 12 (twelve) hours.  14 tablet  0  . digoxin (LANOXIN) 0.125 MG tablet Take 0.125 mg by mouth daily.      . diphenhydramine-acetaminophen (TYLENOL PM EXTRA STRENGTH) 25-500 MG TABS Take 1-2 tablets by mouth at bedtime as needed. For sleep      . flecainide (TAMBOCOR) 50 MG tablet Take 50 mg by mouth 2 (two) times daily.        . lansoprazole (PREVACID) 15 MG capsule Take 15 mg by mouth daily.      . Multiple Vitamin (MULTIVITAMIN WITH  MINERALS) TABS Take 1 tablet by mouth daily.      . Multiple Vitamins-Minerals (ICAPS) TABS Take 2 tablets by mouth 2 (two) times daily.      . rosuvastatin (CRESTOR) 10 MG tablet Take 5 mg by mouth every Monday, Wednesday, and Friday.       . tiotropium (SPIRIVA) 18 MCG inhalation capsule Place 18 mcg into inhaler and inhale daily.      Marland Kitchen warfarin (COUMADIN) 2 MG tablet Take 1 tablet (2 mg total) by mouth daily. Tue, wed, thur, sat  30 tablet  1   No current facility-administered medications for this visit.    Allergies  Allergen Reactions  . Sulfa Antibiotics Other (See Comments)    [name]  . Celebrex (Celecoxib) Swelling  . Clarithromycin Swelling  . Statins Other (See Comments)    Hair fell out  . Vioxx (Rofecoxib) Other (See Comments)    Renal problems  . Penicillins Rash    Past Medical History  Diagnosis Date  . A-fib   . Hypertension   . High cholesterol   . Macular degeneration   . GERD (gastroesophageal reflux disease)   . TIA (transient ischemic attack)     several in past    BP 154/70  Pulse 72 (standing) and 170/78 seated.  ASSESSMENT AND PLAN: At this time we will make no changes or add any meds to her regimen.  I have asked her to continue monitoring her BP at home and if she sees readings reach 180-190s with any regularity she needs to call for an appointment.  I will refer her back to Dr. Allyson Sabal for a 6 month follow up in Sept/Oct

## 2013-05-06 ENCOUNTER — Telehealth: Payer: Self-pay | Admitting: Cardiology

## 2013-05-06 NOTE — Telephone Encounter (Signed)
Ms Coke called complaining of breakthrough PAF the last 48 hrs. I discussed this with Dr Allyson Sabal. We increased her Flecanide to 100 mg BID. She will follow up in a week in the office.  Corine Shelter PA-C 05/06/2013 9:11 AM

## 2013-05-21 ENCOUNTER — Ambulatory Visit: Payer: Medicare Other | Admitting: Cardiology

## 2013-05-23 ENCOUNTER — Ambulatory Visit (INDEPENDENT_AMBULATORY_CARE_PROVIDER_SITE_OTHER): Payer: Medicare Other | Admitting: Cardiology

## 2013-05-23 ENCOUNTER — Encounter: Payer: Self-pay | Admitting: Cardiology

## 2013-05-23 VITALS — BP 168/74 | HR 63 | Ht 66.0 in | Wt 157.3 lb

## 2013-05-23 DIAGNOSIS — E871 Hypo-osmolality and hyponatremia: Secondary | ICD-10-CM

## 2013-05-23 DIAGNOSIS — Z7901 Long term (current) use of anticoagulants: Secondary | ICD-10-CM

## 2013-05-23 DIAGNOSIS — I10 Essential (primary) hypertension: Secondary | ICD-10-CM

## 2013-05-23 DIAGNOSIS — I359 Nonrheumatic aortic valve disorder, unspecified: Secondary | ICD-10-CM

## 2013-05-23 DIAGNOSIS — E7800 Pure hypercholesterolemia, unspecified: Secondary | ICD-10-CM | POA: Insufficient documentation

## 2013-05-23 DIAGNOSIS — I350 Nonrheumatic aortic (valve) stenosis: Secondary | ICD-10-CM

## 2013-05-23 DIAGNOSIS — I4891 Unspecified atrial fibrillation: Secondary | ICD-10-CM

## 2013-05-23 DIAGNOSIS — I480 Paroxysmal atrial fibrillation: Secondary | ICD-10-CM

## 2013-05-23 MED ORDER — CARVEDILOL 12.5 MG PO TABS: ORAL_TABLET | ORAL | Status: DC

## 2013-05-23 MED ORDER — FLECAINIDE ACETATE 100 MG PO TABS: 100.0000 mg | ORAL_TABLET | Freq: Two times a day (BID) | ORAL | Status: DC

## 2013-05-23 NOTE — Progress Notes (Signed)
05/23/2013 Madeline Serrano   06-Oct-1929  [id number]  Primary Physicia Kari Baars, MD  Primary Cardiologist: Dr Allyson Sabal  HPI:  79 y/o followed by Dr Allyson Sabal and Dr Clelia Croft. She has a hx of PAF and TIAs. She has been holding NSR on Flecainide. She is on Coumadin. She had no history of CAD and had a low risk Myoview Jan 2012. Echo was done 11/13 and she was noted to have Mild AS with NL LVF and moderate LAE. The weekend of 8/8 she called complaining of palpitations and  felt she was in AF.Her Flecainide was increased to 100 mg BID. She is here today for follow up. She seems to have maintained NSR. EKG shows NSR at 62 with a QTc of 407.    Current Outpatient Prescriptions  Medication Sig Dispense Refill  . aspirin 81 MG chewable tablet Chew 1 tablet (81 mg total) by mouth daily.      . calcium-vitamin D (OSCAL WITH D) 500-200 MG-UNIT per tablet Take 1 tablet by mouth 3 (three) times daily.      . carvedilol (COREG) 12.5 MG tablet Take 12.5 mg at night, 6.25 mg in the morning      . fish oil-omega-3 fatty acids 1000 MG capsule Take 1 g by mouth daily.      . lansoprazole (PREVACID) 15 MG capsule Take 15 mg by mouth daily.      . Multiple Vitamin (MULTIVITAMIN WITH MINERALS) TABS Take 1 tablet by mouth daily.      . Multiple Vitamins-Minerals (ICAPS) TABS Take 2 tablets by mouth 2 (two) times daily.      . nitrofurantoin, macrocrystal-monohydrate, (MACROBID) 100 MG capsule Take 1 capsule by mouth 2 (two) times daily.      Marland Kitchen tiotropium (SPIRIVA) 18 MCG inhalation capsule Place 18 mcg into inhaler and inhale daily.      Marland Kitchen warfarin (COUMADIN) 2 MG tablet Take 1 tablet (2 mg total) by mouth daily. Tue, wed, thur, sat  30 tablet  1  . flecainide (TAMBOCOR) 100 MG tablet Take 1 tablet (100 mg total) by mouth 2 (two) times daily.  180 tablet  3   No current facility-administered medications for this visit.    Allergies  Allergen Reactions  . Sulfa Antibiotics Other (See Comments)    [name]  .  Celebrex [Celecoxib] Swelling  . Clarithromycin Swelling  . Statins Other (See Comments)    Hair fell out  . Vioxx [Rofecoxib] Other (See Comments)    Renal problems  . Penicillins Rash    History   Social History  . Marital Status: Widowed    Spouse Name: N/A    Number of Children: N/A  . Years of Education: N/A   Occupational History  . Not on file.   Social History Main Topics  . Smoking status: Never Smoker   . Smokeless tobacco: Never Used  . Alcohol Use: No  . Drug Use: No  . Sexual Activity: Not on file   Other Topics Concern  . Not on file   Social History Narrative  . No narrative on file     Review of Systems: General: negative for chills, fever, night sweats or weight changes.  Cardiovascular: negative for chest pain, dyspnea on exertion, edema, orthopnea, palpitations, paroxysmal nocturnal dyspnea or shortness of breath Dermatological: negative for rash Respiratory: negative for cough or wheezing Urologic: negative for hematuria, Recent UTI- placed on Macrobid. Abdominal: negative for nausea, vomiting, diarrhea, bright red blood per rectum, melena, or  hematemesis Neurologic: negative for visual changes, syncope, she has dizziness off and on. All other systems reviewed and are otherwise negative except as noted above.    Blood pressure 168/74, pulse 63, height 5\' 6"  (1.676 m), weight 157 lb 4.8 oz (71.351 kg).  General appearance: alert, cooperative and no distress Neck: no carotid bruit and no JVD Lungs: clear to auscultation bilaterally Heart: regular rate and rhythm and 2/6 systolic murmur  EKG NSR, 1st degree AVB, QTc 407  ASSESSMENT AND PLAN:   PAF NSR on Flecanide She called this past weekend with PAF, Flecainide increased to 100 mg BID  Chronic anticoagulation Dr Clelia Croft follows  Aortic valve stenosis, mild Echo 11/13  Hypertension Controlled  Hyponatremia Recent issue, being followed by Dr Marlena Clipper  When reviewing her  medications she indicated that her Coreg was cut back to 12.5 mg HS and 6.25 mg in the am, probably because of her complaints of dizziness. I went ahead and stopped her Lanoxin since we increased her Flecainide. I set her up to see Dr Allyson Sabal in two months. We will try and get her recent labs from Dr Clelia Croft.   [name] KPA-C 05/23/2013 11:48 AM

## 2013-05-23 NOTE — Assessment & Plan Note (Signed)
Controlled.  

## 2013-05-23 NOTE — Assessment & Plan Note (Signed)
Dr Shaw follows 

## 2013-05-23 NOTE — Assessment & Plan Note (Signed)
She called this past weekend with PAF, Flecainide increased to 100 mg BID

## 2013-05-23 NOTE — Assessment & Plan Note (Signed)
Recent issue, being followed by Dr Clelia Croft

## 2013-05-23 NOTE — Patient Instructions (Addendum)
Continue Flecanide 100 mg twice a day.  Continue Coreg 12.5 mg at night and 6.25 mg in am. Stop digoxin. Your physician recommends that you schedule a follow-up appointment in: 2 months with Dr Allyson Sabal

## 2013-05-23 NOTE — Assessment & Plan Note (Signed)
Echo 11/13

## 2013-06-12 ENCOUNTER — Telehealth: Payer: Self-pay | Admitting: Cardiovascular Disease

## 2013-06-12 NOTE — Telephone Encounter (Signed)
Returned call.  Pt stated she seen by urology last Friday.  Stated BP as 209/113 and 201/91.  Stated they contacted her PCP, Dr. Clelia Croft, who she saw today.  Stated BP was 217/"something" and 191/"something" at Dr. Alver Fisher office today and he told her to continue carvedilol 12.5mg  at night and also take 12.5mg  in the morning too.  Stated he told her to call Dr. Allyson Sabal to find out if that is okay.  Pt stated she was having pressure in her eyes and since she has taken the extra 1/2 carvedilol, she can tell it has eased off.  Pt informed Dr. Allyson Sabal will be notified for further instructions.  Pt verbalized understanding and agreed w/ plan.  Message forwarded to Dr. Allyson Sabal for review and further instructions

## 2013-06-12 NOTE — Telephone Encounter (Signed)
Pleaae call-having problems with her BP. She went to another doctor-changed her medicine and wanted her to contact Dr Allyson Sabal.

## 2013-06-29 NOTE — Telephone Encounter (Signed)
Have patient come in to see Kristinfor blood pressure check and medicine adjustment

## 2013-07-02 NOTE — Telephone Encounter (Signed)
Returned call and informed pt per instructions by MD/PA.  Pt verbalized understanding and agreed w/ plan.  Pt transferred to scheduling to set up appt.

## 2013-07-06 ENCOUNTER — Other Ambulatory Visit: Payer: Self-pay | Admitting: Pharmacist Clinician (PhC)/ Clinical Pharmacy Specialist

## 2013-07-06 ENCOUNTER — Ambulatory Visit (INDEPENDENT_AMBULATORY_CARE_PROVIDER_SITE_OTHER): Payer: Medicare Other | Admitting: Pharmacist Clinician (PhC)/ Clinical Pharmacy Specialist

## 2013-07-06 ENCOUNTER — Encounter: Payer: Self-pay | Admitting: Pharmacist Clinician (PhC)/ Clinical Pharmacy Specialist

## 2013-07-06 VITALS — BP 190/80 | HR 68

## 2013-07-06 DIAGNOSIS — I10 Essential (primary) hypertension: Secondary | ICD-10-CM

## 2013-07-06 MED ORDER — CHLORTHALIDONE 25 MG PO TABS: 12.5000 mg | ORAL_TABLET | Freq: Every day | ORAL | Status: DC

## 2013-07-06 NOTE — Progress Notes (Signed)
HPI:  Madeline Serrano is a 79 y.o. female with a PMH below who presents today for a follow up blood pressure check.  Pt states went to urologist and BP was 209/110 and follow up with her primary MD was 217/? And 190/?.  Her carvedilol was increased to 12.5mg  twice daily.  [name] states that she feels "bad" when her BP is low (<130 systolic) - but only describes light sensitivity and generalized weakness.  She states that when BP is >180s systolic she feels "worse", because when it's low she will eat 1-2 salted pretzels to feel better.  She states that light sensitivity is also present when pressure is elevated, but beyond that cannot describe in clear terms how she feels.  She is currently being treated for UTI prophylaxis with nitrofurantion 100mg  qd and states is dealing with flare up of phlebitis for which she uses warm compresses.   She has been unable to check her BP at home (she has 2 cuffs) as the machines work "just fine" on her daughter in law, but always read "error" when she uses them.    Current Outpatient Prescriptions  Medication Sig Dispense Refill  . aspirin 81 MG chewable tablet Chew 1 tablet (81 mg total) by mouth daily.      . calcium-vitamin D (OSCAL WITH D) 500-200 MG-UNIT per tablet Take 1 tablet by mouth 3 (three) times daily.      . carvedilol (COREG) 12.5 MG tablet Take 12.5 mg at night, 6.25 mg in the morning      . chlorthalidone (HYGROTON) 25 MG tablet Take 0.5 tablets (12.5 mg total) by mouth daily.  15 tablet  1  . digoxin (LANOXIN) 0.125 MG tablet Take 1 tablet by mouth daily.      . fish oil-omega-3 fatty acids 1000 MG capsule Take 1 g by mouth daily.      . flecainide (TAMBOCOR) 100 MG tablet Take 1 tablet (100 mg total) by mouth 2 (two) times daily.  180 tablet  3  . lansoprazole (PREVACID) 15 MG capsule Take 15 mg by mouth daily.      . Multiple Vitamin (MULTIVITAMIN WITH MINERALS) TABS Take 1 tablet by mouth daily.      . Multiple Vitamins-Minerals (ICAPS)  TABS Take 2 tablets by mouth 2 (two) times daily.      . nitrofurantoin, macrocrystal-monohydrate, (MACROBID) 100 MG capsule Take 1 capsule by mouth 2 (two) times daily.      Marland Kitchen tiotropium (SPIRIVA) 18 MCG inhalation capsule Place 18 mcg into inhaler and inhale daily.      Marland Kitchen warfarin (COUMADIN) 2 MG tablet Take 1 tablet (2 mg total) by mouth daily. Tue, wed, thur, sat  30 tablet  1   No current facility-administered medications for this visit.    Allergies  Allergen Reactions  . Sulfa Antibiotics Other (See Comments)    Madeline Serrano  . Celebrex [Celecoxib] Swelling  . Clarithromycin Swelling  . Statins Other (See Comments)    Hair fell out  . Vioxx [Rofecoxib] Other (See Comments)    Renal problems  . Penicillins Rash    Past Medical History  Diagnosis Date  . PAF (paroxysmal atrial fibrillation)     NSR on Flecanide  . Hypertension   . High cholesterol   . Macular degeneration   . GERD (gastroesophageal reflux disease)   . TIA (transient ischemic attack)     several in past  . DJD (degenerative joint disease) 10/13  s/p rt THR  . Chronic anticoagulation   . Aortic valve stenosis, mild 11/13    BP 190/80  Pulse 68  Second reading 180/84 Standing BP 160/76   ASSESSMENT AND PLAN: Pt hypertensive today in office.  She is resistant to start a new medicine, however I explained that with readings this high she definitely needed medication.  She has a sulfa allergy listed in the system, however she cannot recall what that reaction was as it was >50 years ago.  She has also taken triam/hctz in past and cannot recall any problems with that, although she isn't sure who stopped it or when.  I have asked her to start a low dose of chlorthalidone 12.5mg  once daily.  She has an appointment with Dr. Allyson Sabal on Monday, and I have asked her to bring her BP cuffs with her, so that we might determine what her problem is in getting them to work.  I will follow up with her in another rmonth to  see how she is doing with the new medication.  I will have Dr. Allyson Sabal order labs on Monday for BMP/CMP based on what other labs he will need to draw.   Phillips Hay PharmD CPP [facility]

## 2013-07-06 NOTE — Patient Instructions (Addendum)
Return for a blood pressure check again in 1 month  Your blood pressure today is high at 190/80 Check your blood pressure at home daily (if able) and keep record of the readings.  Take your BP meds as follows:  AM: coreg 12.mg          Chlorthalidone 12.5mg    PM: coreg 12.5mg    Bring all of your meds, your BP cuff and your record of home blood pressures to your next appointment.  Exercise as you're able, try to walk approximately 30 minutes per day.  Keep salt intake to a minimum, especially watch canned and prepared boxed foods.  Eat more fresh fruits and vegetables and fewer canned items.  Avoid eating in fast food restaurant  HOW TO TAKE YOUR BLOOD PRESSURE:   Rest 5 minutes before taking your blood pressure.    Don't smoke or drink caffeinated beverages for at least 30 minutes before.   Take your blood pressure before (not after) you eat.   Sit comfortably with your back supported and both feet on the floor (don't cross your legs).   Elevate your arm to heart level on a table or a desk.   Use the proper sized cuff. It should fit smoothly and snugly around your bare upper arm. There should be enough room to slip a fingertip under the cuff. The bottom edge of the cuff should be 1 inch above the crease of the elbow.   Ideally, take 3 measurements at one sitting and record the average.

## 2013-07-09 ENCOUNTER — Ambulatory Visit (INDEPENDENT_AMBULATORY_CARE_PROVIDER_SITE_OTHER): Payer: Medicare Other | Admitting: Cardiovascular Disease

## 2013-07-09 ENCOUNTER — Encounter: Payer: Self-pay | Admitting: Cardiovascular Disease

## 2013-07-09 VITALS — BP 138/80 | HR 64 | Ht 66.0 in | Wt 159.6 lb

## 2013-07-09 DIAGNOSIS — E7800 Pure hypercholesterolemia, unspecified: Secondary | ICD-10-CM

## 2013-07-09 DIAGNOSIS — I480 Paroxysmal atrial fibrillation: Secondary | ICD-10-CM

## 2013-07-09 DIAGNOSIS — I10 Essential (primary) hypertension: Secondary | ICD-10-CM

## 2013-07-09 DIAGNOSIS — I4891 Unspecified atrial fibrillation: Secondary | ICD-10-CM

## 2013-07-09 NOTE — Assessment & Plan Note (Signed)
Maintaining sinus rhythm on Coumadin anticoagulation and flecainide

## 2013-07-09 NOTE — Assessment & Plan Note (Signed)
Under good control of her medications. She did not tolerate the chlorthalidone.

## 2013-07-09 NOTE — Assessment & Plan Note (Signed)
Intolerant to statin therapy 

## 2013-07-09 NOTE — Progress Notes (Signed)
07/09/2013 Madeline Serrano   May 21, 1930  [id number]  Primary Physician Kari Baars, MD Primary Cardiologist: Runell Gess MD [facility], FSC[facility]\    HPI:  The patient is a very pleasant A79-year-old mildly overweight, widowed Caucasian female, mother of 2 sons, grandmother of 2 grandchildren, who I last saw in the office in September. She is originally from [place] and moved to [place] 15 years ago. She has a history of atrial fibrillation with RVR in the past, on Coumadin anticoagulation and flecainide, maintaining sinus rhythm. Her other problems include hypertension, hyperlipidemia, and positive family history for heart disease with a father who died of an MI at age 9. She also has GERD. By echocardiogram, she has normal LV size and function with mild left atrial enlargement and normal Myoview. She has had no episodes of breakthrough PAF. She did break her hip back in January of last year and had hip replacement by Dr. Lajoyce Corners with prolonged hospitalization and rehab at [facility]. She also had several TIAs in the past. She has been adjusting her blood pressure medicines because of symptomatic hypotension in the morning after taking her Coreg and hypertension later in the afternoon. She did have carotid Dopplers performed last June, which were normal.ssince I saw her back in April of this year she is completely asymptomatic. She denies chest pain, shortness of breath will return episodes of A. Fib. Her blood pressure is under better control on increasing doses of carvedilol. She did not tolerate chlorthalidone.   Current Outpatient Prescriptions  Medication Sig Dispense Refill  . aspirin 81 MG chewable tablet Chew 1 tablet (81 mg total) by mouth daily.      . carvedilol (COREG) 12.5 MG tablet Take 12.5 mg by mouth 2 (two) times daily with a meal. Take 12.5 mg at night, 6.25 mg in the morning      . fish oil-omega-3 fatty acids 1000 MG capsule Take 1 g by mouth daily.      .  flecainide (TAMBOCOR) 100 MG tablet Take 1 tablet (100 mg total) by mouth 2 (two) times daily.  180 tablet  3  . lansoprazole (PREVACID) 15 MG capsule Take 15 mg by mouth daily.      . Multiple Vitamin (MULTIVITAMIN WITH MINERALS) TABS Take 1 tablet by mouth daily.      . Multiple Vitamins-Minerals (ICAPS) TABS Take 2 tablets by mouth 2 (two) times daily.      . nitrofurantoin, macrocrystal-monohydrate, (MACROBID) 100 MG capsule Take 1 capsule by mouth 2 (two) times daily.      Marland Kitchen tiotropium (SPIRIVA) 18 MCG inhalation capsule Place 18 mcg into inhaler and inhale daily.      Marland Kitchen warfarin (COUMADIN) 2 MG tablet Take 1 tablet (2 mg total) by mouth daily. Tue, wed, thur, sat  30 tablet  1  . chlorthalidone (HYGROTON) 25 MG tablet Take 0.5 tablets (12.5 mg total) by mouth daily.  15 tablet  1   No current facility-administered medications for this visit.    Allergies  Allergen Reactions  . Sulfa Antibiotics Other (See Comments)    [name]  . Celebrex [Celecoxib] Swelling  . Chlorthalidone Nausea Only    Lightheadedness, weakness, so "sick feeling"  . Clarithromycin Swelling  . Statins Other (See Comments)    Hair fell out  . Vioxx [Rofecoxib] Other (See Comments)    Renal problems  . Penicillins Rash    History   Social History  . Marital Status: Widowed  Spouse Name: N/A    Number of Children: N/A  . Years of Education: N/A   Occupational History  . Not on file.   Social History Main Topics  . Smoking status: Never Smoker   . Smokeless tobacco: Never Used  . Alcohol Use: No  . Drug Use: No  . Sexual Activity: Not on file   Other Topics Concern  . Not on file   Social History Narrative  . No narrative on file     Review of Systems: General: negative for chills, fever, night sweats or weight changes.  Cardiovascular: negative for chest pain, dyspnea on exertion, edema, orthopnea, palpitations, paroxysmal nocturnal dyspnea or shortness of  breath Dermatological: negative for rash Respiratory: negative for cough or wheezing Urologic: negative for hematuria Abdominal: negative for nausea, vomiting, diarrhea, bright red blood per rectum, melena, or hematemesis Neurologic: negative for visual changes, syncope, or dizziness All other systems reviewed and are otherwise negative except as noted above.    Blood pressure 138/80, pulse 64, height 5\' 6"  (1.676 m), weight 159 lb 9.6 oz (72.394 kg).  General appearance: alert and no distress Neck: no adenopathy, no carotid bruit, no JVD, supple, symmetrical, trachea midline and thyroid not enlarged, symmetric, no tenderness/mass/nodules Lungs: clear to auscultation bilaterally Heart: regular rate and rhythm, S1, S2 normal, no murmur, click, rub or gallop Extremities: extremities normal, atraumatic, no cyanosis or edema  EKG normal sinus rhythm at 60 with an incomplete left branch block  ASSESSMENT AND PLAN:   Hypertension Under good control of her medications. She did not tolerate the chlorthalidone.  High cholesterol Intolerant to statin therapy  PAF NSR on Flecanide Maintaining sinus rhythm on Coumadin anticoagulation and flecainide      Runell Gess MD [facility], [facility] 07/09/2013 10:50 AM

## 2013-07-09 NOTE — Patient Instructions (Signed)
Your physician recommends that you schedule a follow-up appointment in 6 month with [facility].  Your physician wants you to follow-up in 12 MONTHS Dr Allyson Sabal.  You will receive a reminder letter in the mail two months in advance. If you don't receive a letter, please call our office to schedule the follow-up appointment.

## 2013-08-06 ENCOUNTER — Ambulatory Visit (INDEPENDENT_AMBULATORY_CARE_PROVIDER_SITE_OTHER): Payer: Medicare Other | Admitting: Pharmacist Clinician (PhC)/ Clinical Pharmacy Specialist

## 2013-08-06 ENCOUNTER — Encounter: Payer: Self-pay | Admitting: Pharmacist Clinician (PhC)/ Clinical Pharmacy Specialist

## 2013-08-06 VITALS — BP 184/76 | HR 72

## 2013-08-06 DIAGNOSIS — I10 Essential (primary) hypertension: Secondary | ICD-10-CM

## 2013-08-06 NOTE — Progress Notes (Signed)
HPI:  Madeline Serrano is a 79 y.o. female with a PMH below who presents today for a follow up blood pressure check.  At her last visit with me approximately 1 month ago, she was started on chlorthalidone to help control BP.  However she took only for 2-3 days as it caused her to feel weak and sickly.  Her BP is quite labile and she states visual disturbances occur if it goes too high or too low.  She doesn't seem to tolerate any changes to her regimen.  She currently takes carvedilol 12.5mg  bid.    Current Outpatient Prescriptions  Medication Sig Dispense Refill  . aspirin 81 MG chewable tablet Chew 1 tablet (81 mg total) by mouth daily.      . carvedilol (COREG) 12.5 MG tablet Take 12.5 mg by mouth 2 (two) times daily with a meal.       . fish oil-omega-3 fatty acids 1000 MG capsule Take 1 g by mouth daily.      . flecainide (TAMBOCOR) 100 MG tablet Take 1 tablet (100 mg total) by mouth 2 (two) times daily.  180 tablet  3  . lansoprazole (PREVACID) 15 MG capsule Take 15 mg by mouth daily.      . Multiple Vitamin (MULTIVITAMIN WITH MINERALS) TABS Take 1 tablet by mouth daily.      . Multiple Vitamins-Minerals (ICAPS) TABS Take 2 tablets by mouth 2 (two) times daily.      . nitrofurantoin, macrocrystal-monohydrate, (MACROBID) 100 MG capsule Take 1 capsule by mouth 2 (two) times daily.      Marland Kitchen tiotropium (SPIRIVA) 18 MCG inhalation capsule Place 18 mcg into inhaler and inhale daily.      Marland Kitchen warfarin (COUMADIN) 2 MG tablet Take 1 tablet (2 mg total) by mouth daily. Tue, wed, thur, sat  30 tablet  1   No current facility-administered medications for this visit.    Allergies  Allergen Reactions  . Sulfa Antibiotics Other (See Comments)    [name]  . Celebrex [Celecoxib] Swelling  . Chlorthalidone Nausea Only    Lightheadedness, weakness, so "sick feeling"  . Clarithromycin Swelling  . Statins Other (See Comments)    Hair fell out  . Vioxx [Rofecoxib] Other (See Comments)   Renal problems  . Penicillins Rash    Past Medical History  Diagnosis Date  . PAF (paroxysmal atrial fibrillation)     NSR on Flecanide  . Hypertension   . High cholesterol   . Macular degeneration   . GERD (gastroesophageal reflux disease)   . TIA (transient ischemic attack)     several in past  . DJD (degenerative joint disease) 10/13    s/p rt THR  . Chronic anticoagulation   . Aortic valve stenosis, mild 11/13    BP 184/76  Pulse 72  Second reading 190/80   ASSESSMENT AND PLAN:  Her BP is definitely elevated today, however I am reluctant to make any changes, due to her history of poor reactions and some low BP readings.  She believes that the high number may be due to eating dinner with her son's family - as she doesn't know how much salt her daughter-in-law cooks with.  Because she gets her BP checked at [facility] on a monthly basis with her INR checks, I have asked her to keep track of the readings.  If they are always elevated or she has continual visual problems associated with hypertension, then she should call.  Otherwise just continue to monitor.     Phillips Hay PharmD CPP [facility]

## 2013-08-06 NOTE — Patient Instructions (Signed)
Your blood pressure today is high at 184/76 Check your blood pressure at home 3-4 times per week (if able) and keep record of the readings.   Bring all of your meds, your BP cuff and your record of home blood pressures to your next appointment.  Exercise as you're able, try to walk approximately 30 minutes per day.  Keep salt intake to a minimum, especially watch canned and prepared boxed foods.  Eat more fresh fruits and vegetables and fewer canned items.  Avoid eating in fast food restaurants.    HOW TO TAKE YOUR BLOOD PRESSURE:   Rest 5 minutes before taking your blood pressure.    Don't smoke or drink caffeinated beverages for at least 30 minutes before.   Take your blood pressure before (not after) you eat.   Sit comfortably with your back supported and both feet on the floor (don't cross your legs).   Elevate your arm to heart level on a table or a desk.   Use the proper sized cuff. It should fit smoothly and snugly around your bare upper arm. There should be enough room to slip a fingertip under the cuff. The bottom edge of the cuff should be 1 inch above the crease of the elbow.   Ideally, take 3 measurements at one sitting and record the average.

## 2013-10-09 ENCOUNTER — Encounter: Payer: Self-pay | Admitting: Cardiovascular Disease

## 2014-01-16 ENCOUNTER — Encounter: Payer: Self-pay | Admitting: Cardiology

## 2014-01-16 ENCOUNTER — Ambulatory Visit (INDEPENDENT_AMBULATORY_CARE_PROVIDER_SITE_OTHER): Payer: Medicare Other | Admitting: Cardiology

## 2014-01-16 VITALS — BP 150/92 | HR 63 | Ht 65.0 in | Wt 160.0 lb

## 2014-01-16 DIAGNOSIS — Z7901 Long term (current) use of anticoagulants: Secondary | ICD-10-CM

## 2014-01-16 DIAGNOSIS — I10 Essential (primary) hypertension: Secondary | ICD-10-CM

## 2014-01-16 DIAGNOSIS — I480 Paroxysmal atrial fibrillation: Secondary | ICD-10-CM

## 2014-01-16 DIAGNOSIS — I350 Nonrheumatic aortic (valve) stenosis: Secondary | ICD-10-CM

## 2014-01-16 DIAGNOSIS — I4891 Unspecified atrial fibrillation: Secondary | ICD-10-CM

## 2014-01-16 DIAGNOSIS — E7800 Pure hypercholesterolemia, unspecified: Secondary | ICD-10-CM

## 2014-01-16 DIAGNOSIS — I359 Nonrheumatic aortic valve disorder, unspecified: Secondary | ICD-10-CM

## 2014-01-16 NOTE — Progress Notes (Signed)
01/16/2014 Madeline Serrano   20-Jan-1930  [id number]  Primary Physicia Martha ClanShaw, William, MD Primary Cardiologist: Dr Allyson SabalBerry  HPI:  Pleasant 80 y/o female followed by Dr Allyson SabalBerry and Dr Clelia CroftShaw. She has a history of PAF. She has been holding NSR on Flecainide. She is on chronic Coumadin. Since we saw her last she has not had recurrent PAF. She denies any unusual chest pain, syncope, or dyspnea. Dr Clelia CroftShaw follows her lipids and has adjusted her medications for this and she says she is tolerating this much better.   Current Outpatient Prescriptions  Medication Sig Dispense Refill  . aspirin 81 MG chewable tablet Chew 1 tablet (81 mg total) by mouth daily.      . carvedilol (COREG) 12.5 MG tablet Take 12.5 mg by mouth 2 (two) times daily with a meal.       . fexofenadine (ALLEGRA) 180 MG tablet Take 180 mg by mouth daily.      . fish oil-omega-3 fatty acids 1000 MG capsule Take 1 g by mouth daily.      . flecainide (TAMBOCOR) 100 MG tablet Take 1 tablet (100 mg total) by mouth 2 (two) times daily.  180 tablet  3  . lansoprazole (PREVACID) 15 MG capsule Take 15 mg by mouth daily.      . Multiple Vitamin (MULTIVITAMIN WITH MINERALS) TABS Take 1 tablet by mouth daily.      . Multiple Vitamins-Minerals (ICAPS) TABS Take 2 tablets by mouth 2 (two) times daily.      . nitrofurantoin (MACRODANTIN) 100 MG capsule       . Pitavastatin Calcium (LIVALO) 1 MG TABS Take 1 mg by mouth daily.      Marland Kitchen. tiotropium (SPIRIVA) 18 MCG inhalation capsule Place 18 mcg into inhaler and inhale daily.      Marland Kitchen. warfarin (COUMADIN) 2 MG tablet Take 1 tablet (2 mg total) by mouth daily. Tue, wed, thur, sat  30 tablet  1   No current facility-administered medications for this visit.    Allergies  Allergen Reactions  . Sulfa Antibiotics Other (See Comments)    Kathlen BrunswickStevens Johnsons  . Celebrex [Celecoxib] Swelling  . Chlorthalidone Nausea Only    Lightheadedness, weakness, so "sick feeling"  . Clarithromycin Swelling  . Statins Other  (See Comments)    Hair fell out  . Vioxx [Rofecoxib] Other (See Comments)    Renal problems  . Penicillins Rash    History   Social History  . Marital Status: Widowed    Spouse Name: N/A    Number of Children: N/A  . Years of Education: N/A   Occupational History  . Not on file.   Social History Main Topics  . Smoking status: Never Smoker   . Smokeless tobacco: Never Used  . Alcohol Use: No  . Drug Use: No  . Sexual Activity: Not on file   Other Topics Concern  . Not on file   Social History Narrative  . No narrative on file     Review of Systems: General: negative for chills, fever, night sweats or weight changes.  Cardiovascular: negative for chest pain, dyspnea on exertion, edema, orthopnea, palpitations, paroxysmal nocturnal dyspnea or shortness of breath Dermatological: negative for rash Respiratory: negative for cough or wheezing Urologic: negative for hematuria Abdominal: negative for nausea, vomiting, diarrhea, bright red blood per rectum, melena, or hematemesis Neurologic: negative for visual changes, syncope, or dizziness All other systems reviewed and are otherwise negative except as noted above.  Blood pressure 150/92, pulse 63, height 5\' 5"  (1.651 m), weight 160 lb (72.576 kg).  General appearance: alert, cooperative and no distress Neck: no carotid bruit and no JVD Lungs: decreased Rt base Heart: regular rate and rhythm and 2/6 systolic murmur AOv, preserved S2  EKG NSR QTc 444  ASSESSMENT AND PLAN:   PAF NSR on Flecanide Holding NSR, QTc 444  Chronic anticoagulation She declined considering a newer agent  Hypertension Controlled  Aortic valve stenosis, mild Last echo Dec 2013, still mild AS on exam  High cholesterol Dr Clelia CroftShaw follows, multiple drug intolerances   PLAN  Same Rx, Dr Allyson SabalBerry in 6 months.  Eda PaschalLuke K [facility] West[facility] HospitalKilroyPA-C 01/16/2014 10:30 AM

## 2014-01-16 NOTE — Assessment & Plan Note (Signed)
She declined considering a newer agent

## 2014-01-16 NOTE — Assessment & Plan Note (Signed)
Holding NSR, QTc 444

## 2014-01-16 NOTE — Assessment & Plan Note (Signed)
Dr Clelia CroftShaw follows, multiple drug intolerances

## 2014-01-16 NOTE — Patient Instructions (Signed)
Your physician recommends that you schedule a follow-up appointment in: 6 months  

## 2014-01-16 NOTE — Assessment & Plan Note (Signed)
Controlled.  

## 2014-01-16 NOTE — Assessment & Plan Note (Signed)
Last echo Dec 2013, still mild AS on exam

## 2014-05-13 ENCOUNTER — Telehealth: Payer: Self-pay | Admitting: Cardiovascular Disease

## 2014-05-13 MED ORDER — FLECAINIDE ACETATE 100 MG PO TABS: 100.0000 mg | ORAL_TABLET | Freq: Two times a day (BID) | ORAL | Status: DC

## 2014-05-13 NOTE — Telephone Encounter (Signed)
Rx was sent to pharmacy electronically. 

## 2014-05-13 NOTE — Telephone Encounter (Signed)
Is needing a new prescription sent to her pharmacy on Flecainide Acectate 100mg  one tablet by mouth twice a day .Marland Kitchen. [facility] [address] .Marland Kitchen. Thanks

## 2014-05-27 ENCOUNTER — Encounter (HOSPITAL_COMMUNITY): Payer: Self-pay | Admitting: Emergency Medicine

## 2014-05-27 ENCOUNTER — Emergency Department (HOSPITAL_COMMUNITY): Payer: Medicare Other

## 2014-05-27 ENCOUNTER — Observation Stay (HOSPITAL_COMMUNITY)
Admission: EM | Admit: 2014-05-27 | Discharge: 2014-05-30 | Disposition: A | Discharge status: Home / Self Care | Payer: Medicare Other | Attending: Cardiology | Admitting: Cardiology

## 2014-05-27 DIAGNOSIS — I10 Essential (primary) hypertension: Secondary | ICD-10-CM | POA: Insufficient documentation

## 2014-05-27 DIAGNOSIS — G459 Transient cerebral ischemic attack, unspecified: Secondary | ICD-10-CM | POA: Diagnosis present

## 2014-05-27 DIAGNOSIS — J438 Other emphysema: Secondary | ICD-10-CM | POA: Diagnosis not present

## 2014-05-27 DIAGNOSIS — I5032 Chronic diastolic (congestive) heart failure: Secondary | ICD-10-CM | POA: Diagnosis not present

## 2014-05-27 DIAGNOSIS — Z8673 Personal history of transient ischemic attack (TIA), and cerebral infarction without residual deficits: Secondary | ICD-10-CM | POA: Insufficient documentation

## 2014-05-27 DIAGNOSIS — I4891 Unspecified atrial fibrillation: Secondary | ICD-10-CM | POA: Diagnosis not present

## 2014-05-27 DIAGNOSIS — Z79899 Other long term (current) drug therapy: Secondary | ICD-10-CM | POA: Diagnosis not present

## 2014-05-27 DIAGNOSIS — I509 Heart failure, unspecified: Secondary | ICD-10-CM | POA: Diagnosis not present

## 2014-05-27 DIAGNOSIS — Z7901 Long term (current) use of anticoagulants: Secondary | ICD-10-CM | POA: Insufficient documentation

## 2014-05-27 DIAGNOSIS — K219 Gastro-esophageal reflux disease without esophagitis: Secondary | ICD-10-CM | POA: Diagnosis not present

## 2014-05-27 DIAGNOSIS — Z823 Family history of stroke: Secondary | ICD-10-CM | POA: Insufficient documentation

## 2014-05-27 DIAGNOSIS — I359 Nonrheumatic aortic valve disorder, unspecified: Secondary | ICD-10-CM | POA: Insufficient documentation

## 2014-05-27 DIAGNOSIS — I480 Paroxysmal atrial fibrillation: Secondary | ICD-10-CM

## 2014-05-27 DIAGNOSIS — E785 Hyperlipidemia, unspecified: Secondary | ICD-10-CM | POA: Insufficient documentation

## 2014-05-27 DIAGNOSIS — R0789 Other chest pain: Secondary | ICD-10-CM

## 2014-05-27 DIAGNOSIS — Z8249 Family history of ischemic heart disease and other diseases of the circulatory system: Secondary | ICD-10-CM | POA: Diagnosis not present

## 2014-05-27 DIAGNOSIS — Z7982 Long term (current) use of aspirin: Secondary | ICD-10-CM | POA: Insufficient documentation

## 2014-05-27 DIAGNOSIS — R079 Chest pain, unspecified: Principal | ICD-10-CM | POA: Insufficient documentation

## 2014-05-27 DIAGNOSIS — E871 Hypo-osmolality and hyponatremia: Secondary | ICD-10-CM | POA: Diagnosis not present

## 2014-05-27 DIAGNOSIS — J449 Chronic obstructive pulmonary disease, unspecified: Secondary | ICD-10-CM | POA: Diagnosis present

## 2014-05-27 DIAGNOSIS — I5033 Acute on chronic diastolic (congestive) heart failure: Secondary | ICD-10-CM | POA: Diagnosis not present

## 2014-05-27 DIAGNOSIS — J811 Chronic pulmonary edema: Secondary | ICD-10-CM | POA: Diagnosis not present

## 2014-05-27 DIAGNOSIS — E7800 Pure hypercholesterolemia, unspecified: Secondary | ICD-10-CM | POA: Insufficient documentation

## 2014-05-27 DIAGNOSIS — I279 Pulmonary heart disease, unspecified: Secondary | ICD-10-CM

## 2014-05-27 LAB — CBC WITH DIFFERENTIAL/PLATELET
BASOS ABS: 0.1 10*3/uL (ref 0.0–0.1)
BASOS PCT: 1 % (ref 0–1)
Eosinophils Absolute: 1.2 10*3/uL — ABNORMAL HIGH (ref 0.0–0.7)
Eosinophils Relative: 12 % — ABNORMAL HIGH (ref 0–5)
HEMATOCRIT: 33.5 % — AB (ref 36.0–46.0)
HEMOGLOBIN: 11.6 g/dL — AB (ref 12.0–15.0)
LYMPHS PCT: 15 % (ref 12–46)
Lymphs Abs: 1.5 10*3/uL (ref 0.7–4.0)
MCH: 30.7 pg (ref 26.0–34.0)
MCHC: 34.6 g/dL (ref 30.0–36.0)
MCV: 88.6 fL (ref 78.0–100.0)
MONO ABS: 0.8 10*3/uL (ref 0.1–1.0)
MONOS PCT: 8 % (ref 3–12)
NEUTROS ABS: 6.4 10*3/uL (ref 1.7–7.7)
NEUTROS PCT: 64 % (ref 43–77)
Platelets: 287 10*3/uL (ref 150–400)
RBC: 3.78 MIL/uL — ABNORMAL LOW (ref 3.87–5.11)
RDW: 14.2 % (ref 11.5–15.5)
WBC: 10 10*3/uL (ref 4.0–10.5)

## 2014-05-27 LAB — COMPREHENSIVE METABOLIC PANEL
ALBUMIN: 3.4 g/dL — AB (ref 3.5–5.2)
ALK PHOS: 111 U/L (ref 39–117)
ALT: 12 U/L (ref 0–35)
AST: 21 U/L (ref 0–37)
Anion gap: 13 (ref 5–15)
BILIRUBIN TOTAL: 0.6 mg/dL (ref 0.3–1.2)
BUN: 17 mg/dL (ref 6–23)
CHLORIDE: 93 meq/L — AB (ref 96–112)
CO2: 23 meq/L (ref 19–32)
CREATININE: 0.95 mg/dL (ref 0.50–1.10)
Calcium: 9.3 mg/dL (ref 8.4–10.5)
GFR calc Af Amer: 63 mL/min — ABNORMAL LOW (ref >90)
GFR, EST NON AFRICAN AMERICAN: 54 mL/min — AB (ref >90)
Glucose, Bld: 102 mg/dL — ABNORMAL HIGH (ref 70–99)
POTASSIUM: 4.9 meq/L (ref 3.7–5.3)
Sodium: 129 mEq/L — ABNORMAL LOW (ref 137–147)
Total Protein: 7.5 g/dL (ref 6.0–8.3)

## 2014-05-27 LAB — URINALYSIS, ROUTINE W REFLEX MICROSCOPIC
BILIRUBIN URINE: NEGATIVE
GLUCOSE, UA: NEGATIVE mg/dL
HGB URINE DIPSTICK: NEGATIVE
Ketones, ur: NEGATIVE mg/dL
Leukocytes, UA: NEGATIVE
Nitrite: NEGATIVE
PH: 7 (ref 5.0–8.0)
Protein, ur: NEGATIVE mg/dL
SPECIFIC GRAVITY, URINE: 1.011 (ref 1.005–1.030)
Urobilinogen, UA: 0.2 mg/dL (ref 0.0–1.0)

## 2014-05-27 LAB — TROPONIN I

## 2014-05-27 LAB — PROTIME-INR
INR: 3.12 — AB (ref 0.00–1.49)
PROTHROMBIN TIME: 32.1 s — AB (ref 11.6–15.2)

## 2014-05-27 LAB — PRO B NATRIURETIC PEPTIDE: Pro B Natriuretic peptide (BNP): 1295 pg/mL — ABNORMAL HIGH (ref 0–450)

## 2014-05-27 MED ORDER — TIOTROPIUM BROMIDE MONOHYDRATE 18 MCG IN CAPS
18.0000 ug | ORAL_CAPSULE | Freq: Every day | RESPIRATORY_TRACT | Status: DC
  Administered 2014-05-28 – 2014-05-30 (×3): 18 ug via RESPIRATORY_TRACT
  Filled 2014-05-27: qty 5

## 2014-05-27 MED ORDER — SODIUM CHLORIDE 0.9 % IJ SOLN
3.0000 mL | Freq: Two times a day (BID) | INTRAMUSCULAR | Status: DC
  Administered 2014-05-27 – 2014-05-30 (×6): 3 mL via INTRAVENOUS

## 2014-05-27 MED ORDER — PANTOPRAZOLE SODIUM 20 MG PO TBEC
20.0000 mg | DELAYED_RELEASE_TABLET | Freq: Every day | ORAL | Status: DC
  Administered 2014-05-28 – 2014-05-30 (×3): 20 mg via ORAL
  Filled 2014-05-27 (×3): qty 1

## 2014-05-27 MED ORDER — ASPIRIN 81 MG PO CHEW
81.0000 mg | CHEWABLE_TABLET | Freq: Every day | ORAL | Status: DC
  Administered 2014-05-28 – 2014-05-30 (×3): 81 mg via ORAL
  Filled 2014-05-27 (×2): qty 1

## 2014-05-27 MED ORDER — OMEGA-3-ACID ETHYL ESTERS 1 G PO CAPS
1.0000 g | ORAL_CAPSULE | Freq: Every day | ORAL | Status: DC
  Administered 2014-05-27 – 2014-05-30 (×4): 1 g via ORAL
  Filled 2014-05-27 (×4): qty 1

## 2014-05-27 MED ORDER — FLECAINIDE ACETATE 100 MG PO TABS
100.0000 mg | ORAL_TABLET | Freq: Two times a day (BID) | ORAL | Status: DC
  Administered 2014-05-27 – 2014-05-30 (×6): 100 mg via ORAL
  Filled 2014-05-27 (×7): qty 1

## 2014-05-27 MED ORDER — NITROGLYCERIN 0.4 MG SL SUBL
0.4000 mg | SUBLINGUAL_TABLET | SUBLINGUAL | Status: DC | PRN
  Administered 2014-05-27 (×2): 0.4 mg via SUBLINGUAL
  Filled 2014-05-27: qty 1

## 2014-05-27 MED ORDER — FUROSEMIDE 10 MG/ML IJ SOLN
40.0000 mg | Freq: Once | INTRAMUSCULAR | Status: AC
  Administered 2014-05-27: 40 mg via INTRAVENOUS
  Filled 2014-05-27: qty 4

## 2014-05-27 MED ORDER — CARVEDILOL 12.5 MG PO TABS
12.5000 mg | ORAL_TABLET | Freq: Two times a day (BID) | ORAL | Status: DC
  Administered 2014-05-27 – 2014-05-30 (×5): 12.5 mg via ORAL
  Filled 2014-05-27 (×8): qty 1

## 2014-05-27 MED ORDER — HYDRALAZINE HCL 20 MG/ML IJ SOLN
10.0000 mg | Freq: Four times a day (QID) | INTRAMUSCULAR | Status: DC | PRN
  Filled 2014-05-27: qty 1

## 2014-05-27 MED ORDER — LORATADINE 10 MG PO TABS
10.0000 mg | ORAL_TABLET | Freq: Every day | ORAL | Status: DC | PRN
  Filled 2014-05-27: qty 1

## 2014-05-27 MED ORDER — SODIUM CHLORIDE 0.9 % IJ SOLN: 3.0000 mL | INTRAMUSCULAR | Status: DC | PRN

## 2014-05-27 MED ORDER — SODIUM CHLORIDE 0.9 % IV SOLN: 250.0000 mL | INTRAVENOUS | Status: DC | PRN

## 2014-05-27 MED ORDER — ADULT MULTIVITAMIN W/MINERALS CH
1.0000 | ORAL_TABLET | Freq: Every day | ORAL | Status: DC
  Administered 2014-05-27 – 2014-05-30 (×4): 1 via ORAL
  Filled 2014-05-27 (×4): qty 1

## 2014-05-27 MED ORDER — CALCIUM CARBONATE 1250 (500 CA) MG PO TABS
1.0000 | ORAL_TABLET | Freq: Three times a day (TID) | ORAL | Status: DC
  Administered 2014-05-27 – 2014-05-30 (×8): 500 mg via ORAL
  Filled 2014-05-27 (×10): qty 1

## 2014-05-27 MED ORDER — ONDANSETRON HCL 4 MG/2ML IJ SOLN
4.0000 mg | Freq: Four times a day (QID) | INTRAMUSCULAR | Status: DC | PRN
  Administered 2014-05-30: 4 mg via INTRAVENOUS
  Filled 2014-05-27: qty 2

## 2014-05-27 MED ORDER — PROSIGHT PO TABS
1.0000 | ORAL_TABLET | Freq: Two times a day (BID) | ORAL | Status: DC
  Administered 2014-05-27 – 2014-05-30 (×6): 1 via ORAL
  Filled 2014-05-27 (×9): qty 1

## 2014-05-27 MED ORDER — ACETAMINOPHEN 325 MG PO TABS
650.0000 mg | ORAL_TABLET | ORAL | Status: DC | PRN
  Administered 2014-05-28: 650 mg via ORAL
  Filled 2014-05-27: qty 2

## 2014-05-27 MED ORDER — NITROGLYCERIN 2 % TD OINT
1.0000 [in_us] | TOPICAL_OINTMENT | Freq: Four times a day (QID) | TRANSDERMAL | Status: DC
  Administered 2014-05-27 – 2014-05-28 (×4): 1 [in_us] via TOPICAL
  Filled 2014-05-27: qty 30
  Filled 2014-05-27: qty 1

## 2014-05-27 MED ORDER — FUROSEMIDE 40 MG PO TABS
40.0000 mg | ORAL_TABLET | Freq: Once | ORAL | Status: AC
  Administered 2014-05-28: 40 mg via ORAL
  Filled 2014-05-27: qty 1

## 2014-05-27 MED ORDER — NITROFURANTOIN MACROCRYSTAL 100 MG PO CAPS
100.0000 mg | ORAL_CAPSULE | Freq: Every day | ORAL | Status: DC
  Administered 2014-05-28 – 2014-05-30 (×3): 100 mg via ORAL
  Filled 2014-05-27 (×3): qty 1

## 2014-05-27 NOTE — ED Notes (Signed)
PT continues to deny chest pressure/pain.  Nitro given to decrease bp.

## 2014-05-27 NOTE — ED Provider Notes (Signed)
CSN: [id number]     Arrival date & time 05/27/14  8657 History   First MD Initiated Contact with Patient 05/27/14 0919     Chief Complaint  Patient presents with  . Shortness of Breath  . Chest Pain     (Consider location/radiation/quality/duration/timing/severity/associated sxs/prior Treatment) HPI Comments: Patient presents to ER for evaluation of shortness of breath. Patient reports that she has been feeling generalized weakness, chest discomfort and shortness of breath. Symptoms began when she awakened this morning. If her symptoms she felt was mild dizziness, they noticed that she was very short of breath. Patient reported a "discomfort" and heaviness in the chest, was not present during the evaluation here in the ER. Patient reports that she has had congestive heart failure in the past, but currently is not receiving any treatment for this because she has had problems with severe hyponatremia when using diuretics.  Patient is a 80 y.o. female presenting with shortness of breath and chest pain.  Shortness of Breath Associated symptoms: chest pain   Chest Pain Associated symptoms: shortness of breath     Past Medical History  Diagnosis Date  . PAF (paroxysmal atrial fibrillation)     NSR on Flecanide  . Hypertension   . High cholesterol   . Macular degeneration   . GERD (gastroesophageal reflux disease)   . TIA (transient ischemic attack)     several in past  . DJD (degenerative joint disease) 10/13    s/p rt THR  . Chronic anticoagulation   . Aortic valve stenosis, mild 11/13   Past Surgical History  Procedure Laterality Date  . Hip arthroplasty  06/30/2012    Procedure: ARTHROPLASTY BIPOLAR HIP;  Surgeon: Shelda Pal, MD;  Location: [facility] OR;  Service: Orthopedics;  Laterality: Right;  right hip hemiarthroplasty  . Carotid duplex  03/20/2012    Bilateral Bulb/Proximal ICAs-mild plaque, mildly abnormal scan  . Cardiovascular stress test  10/13/2010    Normal study, no ECG  changes  . Transthoracic echocardiogram  08/25/2012    EF 60-65%, moderately calcified leaflets of the aortic valve, LA moderately dilated,   Family History  Problem Relation Age of Onset  . Emphysema Mother   . Diabetes Father   . Heart attack Father 73  . Stroke Maternal Grandfather   . Heart disease Paternal Grandfather   . Heart disease Paternal Grandmother   . Cancer Son     testicular x3  . Pancreatitis Son   . Diabetes Son     Borderline   History  Substance Use Topics  . Smoking status: Never Smoker   . Smokeless tobacco: Never Used  . Alcohol Use: No   OB History   Grav Para Term Preterm Abortions TAB SAB Ect Mult Living                 Review of Systems  Respiratory: Positive for shortness of breath.   Cardiovascular: Positive for chest pain.  All other systems reviewed and are negative.     Allergies  Sulfa antibiotics; Celebrex; Chlorthalidone; Clarithromycin; Other; Statins; Vioxx; and Penicillins  Home Medications   Prior to Admission medications   Medication Sig Start Date End Date Taking? Authorizing Provider  aspirin 81 MG chewable tablet Chew 1 tablet (81 mg total) by mouth daily. 09/01/12  Yes Martha Clan, MD  CALCIUM PO Take 1,800 mg by mouth daily.   Yes Historical Provider, MD  carvedilol (COREG) 12.5 MG tablet Take 12.5 mg by mouth 2 (two) times  daily with a meal.  05/23/13  Yes Luke K Kilroy, PA-C  fexofenadine (ALLEGRA) 180 MG tablet Take 180 mg by mouth daily as needed for allergies.    Yes Historical Provider, MD  fish oil-omega-3 fatty acids 1000 MG capsule Take 1 g by mouth daily.   Yes Historical Provider, MD  flecainide (TAMBOCOR) 100 MG tablet Take 1 tablet (100 mg total) by mouth 2 (two) times daily. 05/13/14  Yes Runell Gess, MD  lansoprazole (PREVACID) 15 MG capsule Take 15 mg by mouth daily as needed (reflux).    Yes Historical Provider, MD  Multiple Vitamin (MULTIVITAMIN WITH MINERALS) TABS Take 1 tablet by mouth daily.   Yes  Historical Provider, MD  Multiple Vitamins-Minerals (ICAPS) TABS Take 2 tablets by mouth 2 (two) times daily.   Yes Historical Provider, MD  nitrofurantoin (MACRODANTIN) 100 MG capsule Take 100 mg by mouth daily.  12/17/13  Yes Historical Provider, MD  Pitavastatin Calcium (LIVALO) 1 MG TABS Take 1 mg by mouth daily.   Yes Historical Provider, MD  tiotropium (SPIRIVA) 18 MCG inhalation capsule Place 18 mcg into inhaler and inhale daily.   Yes Historical Provider, MD  warfarin (COUMADIN) 2 MG tablet Take 2-4 mg by mouth See admin instructions. Take 4 mg every Tuesday,thurs & Saturday then 2 mg the rest of the days   Yes Historical Provider, MD   BP 192/77  Pulse 60  Temp(Src) 97.8 F (36.6 C) (Oral)  Resp 20  SpO2 99% Physical Exam  Constitutional: She is oriented to person, place, and time. She appears well-developed and well-nourished. No distress.  HENT:  Head: Normocephalic and atraumatic.  Right Ear: Hearing normal.  Left Ear: Hearing normal.  Nose: Nose normal.  Mouth/Throat: Oropharynx is clear and moist and mucous membranes are normal.  Eyes: Conjunctivae and EOM are normal. Pupils are equal, round, and reactive to light.  Neck: Normal range of motion. Neck supple.  Cardiovascular: Regular rhythm, S1 normal and S2 normal.  Exam reveals no gallop and no friction rub.   No murmur heard. Pulmonary/Chest: Effort normal and breath sounds normal. No respiratory distress. She exhibits no tenderness.  Abdominal: Soft. Normal appearance and bowel sounds are normal. There is no hepatosplenomegaly. There is no tenderness. There is no rebound, no guarding, no tenderness at McBurney's point and negative Murphy's sign. No hernia.  Musculoskeletal: Normal range of motion.  Neurological: She is alert and oriented to person, place, and time. She has normal strength. No cranial nerve deficit or sensory deficit. Coordination normal. GCS eye subscore is 4. GCS verbal subscore is 5. GCS motor subscore  is 6.  Skin: Skin is warm, dry and intact. No rash noted. No cyanosis.  Psychiatric: She has a normal mood and affect. Her speech is normal and behavior is normal. Thought content normal.    ED Course  Procedures (including critical care time) Labs Review Labs Reviewed  CBC WITH DIFFERENTIAL - Abnormal; Notable for the following:    RBC 3.78 (*)    Hemoglobin 11.6 (*)    HCT 33.5 (*)    Eosinophils Relative 12 (*)    Eosinophils Absolute 1.2 (*)    All other components within normal limits  COMPREHENSIVE METABOLIC PANEL - Abnormal; Notable for the following:    Sodium 129 (*)    Chloride 93 (*)    Glucose, Bld 102 (*)    Albumin 3.4 (*)    GFR calc non Af Amer 54 (*)    GFR calc Af  Amer 63 (*)    All other components within normal limits  PRO B NATRIURETIC PEPTIDE - Abnormal; Notable for the following:    Pro B Natriuretic peptide (BNP) 1295.0 (*)    All other components within normal limits  PROTIME-INR - Abnormal; Notable for the following:    Prothrombin Time 32.1 (*)    INR 3.12 (*)    All other components within normal limits  TROPONIN I  URINALYSIS, ROUTINE W REFLEX MICROSCOPIC    Imaging Review Dg Chest Port 1 View  05/27/2014   CLINICAL DATA:  Dizziness with shortness of breath and nausea and hypertension  EXAM: PORTABLE CHEST - 1 VIEW  COMPARISON:  PA and lateral chest of July 04, 2012  FINDINGS: The lungs are mildly hypoinflated. The interstitial markings are increased. There are confluent alveolar densities consistent with edema. There small amounts of pleural fluid blunting the costophrenic angles. The mediastinum is normal in width. The cardiac silhouette is top-normal. There is severe degenerative change of the left glenohumeral joint.  IMPRESSION: Congestive heart failure with mild pulmonary interstitial and alveolar edema. Pneumonia is felt less likely. When the patient can tolerate the procedure, a PA and lateral chest x-ray would be useful.   Electronically  Signed   By: David  [place]   On: 05/27/2014 10:01     EKG Interpretation   Date/Time:  Monday May 27 2014 09:21:11 EDT Ventricular Rate:  62 PR Interval:  243 QRS Duration: 124 QT Interval:  438 QTC Calculation: 445 R Axis:   77 Text Interpretation:  Sinus rhythm Prolonged PR interval Incomplete left  bundle branch block Confirmed by Marin Milley  MD, Kelleen Stolze ([id number]) on  05/27/2014 11:43:26 AM      MDM   Final diagnoses:  None   uncontrolled hypertension  Congestive heart failure  Patient presents to the ER for evaluation of shortness of breath and chest discomfort. She has a history of atrial fibrillation and what sounds like treatment for congestive heart failure in the past, but does not have any known coronary artery disease. She is adequately anticoagulated from Coumadin use. EKG shows sinus rhythm. She does have some widening of her QRS compared to previous EKG with some morphologic changes of QRSs, suspect incomplete left bundle branch block. Chest x-ray was consistent with congestive heart failure which would explain the patient's current symptoms. Will have cardiology evaluate her for further management.   Gilda Crease, MD 05/28/14 [phone]

## 2014-05-27 NOTE — Progress Notes (Signed)
Discussed with Dr. Myrtis Ser, will hold coumadin for now, trend enzyme overnight. If no procedure tomorrow, will restart coumadin. Will also order  PO lasix for tomorrow morning as well, monitor Na level closely   Signed, Azalee Course PA Pager: [phone]

## 2014-05-27 NOTE — ED Notes (Signed)
Flow manager called as to why pt has not been placed.  She stated pt will need to wait several more hours.

## 2014-05-27 NOTE — ED Notes (Addendum)
Pt is worried because her BP of 112/68 is "way too low for her" explained that this is a good BP and we will continue to monitor her.

## 2014-05-27 NOTE — ED Notes (Signed)
Pt assisted to sitting position to use bedside commode per pt request.  Pt became dizzy and nauseated.  Bedpan used.

## 2014-05-27 NOTE — ED Notes (Signed)
Pt arrives via EMS from home, pt woke up feeling dizzy, nauseated and SOB. Pt with generalized weakness, feels as though she will fall while standing. EKG junction rhythm. 18g L hand.  zofran.  ASA PTA.

## 2014-05-27 NOTE — ED Notes (Signed)
Internal medicine at bedside

## 2014-05-27 NOTE — H&P (Addendum)
Patient ID: Madeline Serrano MRN: [id number], DOB/AGE: Aug 16, 1930   Admit date: 05/27/2014   Primary Physician: Martha Clan, MD Primary Cardiologist: Dr. Allyson Sabal  Pt. Profile:  80 year old Caucasian female with past medical history significant for chronic diastolic heart failure, chronic hyponatremia, history of paroxysmal atrial fibrillation controlled on flecainide and Coumadin, hypertension, hyperlipidemia, multiple TIA and GERD present with dizziness, nausea, SOB and chest pressure. Found to have pulm edema in the setting of malignant hypertension.  Problem List  Past Medical History  Diagnosis Date  . PAF (paroxysmal atrial fibrillation)     NSR on Flecanide  . Hypertension   . High cholesterol   . Macular degeneration   . GERD (gastroesophageal reflux disease)   . TIA (transient ischemic attack)     several in past  . DJD (degenerative joint disease) 10/13    s/p rt THR  . Chronic anticoagulation   . Aortic valve stenosis, mild 11/13    Past Surgical History  Procedure Laterality Date  . Hip arthroplasty  06/30/2012    Procedure: ARTHROPLASTY BIPOLAR HIP;  Surgeon: Shelda Pal, MD;  Location: [facility] OR;  Service: Orthopedics;  Laterality: Right;  right hip hemiarthroplasty  . Carotid duplex  03/20/2012    Bilateral Bulb/Proximal ICAs-mild plaque, mildly abnormal scan  . Cardiovascular stress test  10/13/2010    Normal study, no ECG changes  . Transthoracic echocardiogram  08/25/2012    EF 60-65%, moderately calcified leaflets of the aortic valve, LA moderately dilated,     Allergies  Allergies  Allergen Reactions  . Sulfa Antibiotics Other (See Comments)    [name]  . Celebrex [Celecoxib] Swelling  . Chlorthalidone Nausea Only    Lightheadedness, weakness, so "sick feeling"  . Clarithromycin Swelling  . Other     EXTENDED RELEASE tablets  . Statins Other (See Comments)    Hair fell out  . Vioxx [Rofecoxib] Other (See Comments)    Renal problems  .  Penicillins Rash    HPI  The patient is an 80 year old Caucasian female with past medical history significant for chronic diastolic heart failure, chronic hyponatremia, history of paroxysmal atrial fibrillation controlled on flecainide and Coumadin, hypertension, hyperlipidemia, multiple TIA and GERD. She has no history of coronary artery disease and has never underwent a cardiac catheterization before. She had a negative stress test in January 2012. Her last echocardiogram on 08/25/2012 showed EF 60-65%, grade 1 diastolic dysfunction, moderate focal basal hypertrophy of the septum, mild AR, moderately dilated LA. The last time she visited her cardiologist office was on 01/16/2014, at which time she was doing well. According to family member, patient has been functionally independent at home.   According to the patient, she has not been feeling very well for the past month. She described intermittent episodes of fatigue, dizziness and shortness of breath, however no fever, chill, cough, chest pain, lower extremity edema or paroxysmal nocturnal dyspnea. She has chronic two-pillow orthopnea. Her son was concerned that her emphysema medication has been recently changed, however the patient states, her emphysema medication was changed back to her old one by her PCP. She states she has been compliant with her daily medication. Patient woke up in the morning of 05/27/2014 with severe dizziness, nausea and intermittent shortness of breath prompting her to seek medical attention at [facility].  On arrival to the hospital, she was noted to have systolic blood pressure greater than 200. She is afebrile. Heart rate has been persistent in the 50s  to 60s. EKG showed normal sinus rhythm, with incomplete left bundle branch block, and prolonged PR interval. Significant laboratory finding include sodium 129, potassium 4.9, albumin 3.4, troponin negative, proBNP 1300, hemoglobin 11.6 and hematocrit 33.5. INR is  elevated at 3.12. Urinalysis was negative for nitrite and leukocyte. Chest x-ray was concerning for congestive heart failure.  Of note, while in the ED, patient had an episode of chest pressure which lasted about 5-10 minutes resolved after nitroglycerin patch. She states she never had this chest pain before at home with exertion. Also note, patient has a long-standing history of relative hyponatremia with her sodium ranging between 127 - 130 in the past year.   Home Medications  Prior to Admission medications   Medication Sig Start Date End Date Taking? Authorizing Provider  aspirin 81 MG chewable tablet Chew 1 tablet (81 mg total) by mouth daily. 09/01/12  Yes Martha Clan, MD  CALCIUM PO Take 1,800 mg by mouth daily.   Yes Historical Provider, MD  carvedilol (COREG) 12.5 MG tablet Take 12.5 mg by mouth 2 (two) times daily with a meal.  05/23/13  Yes Eda Paschal Kilroy, PA-C  fexofenadine (ALLEGRA) 180 MG tablet Take 180 mg by mouth daily as needed for allergies.    Yes Historical Provider, MD  fish oil-omega-3 fatty acids 1000 MG capsule Take 1 g by mouth daily.   Yes Historical Provider, MD  flecainide (TAMBOCOR) 100 MG tablet Take 1 tablet (100 mg total) by mouth 2 (two) times daily. 05/13/14  Yes Runell Gess, MD  lansoprazole (PREVACID) 15 MG capsule Take 15 mg by mouth daily as needed (reflux).    Yes Historical Provider, MD  Multiple Vitamin (MULTIVITAMIN WITH MINERALS) TABS Take 1 tablet by mouth daily.   Yes Historical Provider, MD  Multiple Vitamins-Minerals (ICAPS) TABS Take 2 tablets by mouth 2 (two) times daily.   Yes Historical Provider, MD  nitrofurantoin (MACRODANTIN) 100 MG capsule Take 100 mg by mouth daily.  12/17/13  Yes Historical Provider, MD  Pitavastatin Calcium (LIVALO) 1 MG TABS Take 1 mg by mouth daily.   Yes Historical Provider, MD  tiotropium (SPIRIVA) 18 MCG inhalation capsule Place 18 mcg into inhaler and inhale daily.   Yes Historical Provider, MD  warfarin  (COUMADIN) 2 MG tablet Take 2-4 mg by mouth See admin instructions. Take 4 mg every Tuesday,thurs & Saturday then 2 mg the rest of the days   Yes Historical Provider, MD    Family History  Family History  Problem Relation Age of Onset  . Emphysema Mother   . Diabetes Father   . Heart attack Father 76  . Stroke Maternal Grandfather   . Heart disease Paternal Grandfather   . Heart disease Paternal Grandmother   . Cancer Son     testicular x3  . Pancreatitis Son   . Diabetes Son     Borderline    Social History  History   Social History  . Marital Status: Widowed    Spouse Name: N/A    Number of Children: N/A  . Years of Education: N/A   Occupational History  . Not on file.   Social History Main Topics  . Smoking status: Never Smoker   . Smokeless tobacco: Never Used  . Alcohol Use: No  . Drug Use: No  . Sexual Activity: Not on file   Other Topics Concern  . Not on file   Social History Narrative  . No narrative on file  Review of Systems General:  No chills, fever, night sweats or weight changes.  Cardiovascular:  No dyspnea on exertion, edema, palpitations, paroxysmal nocturnal dyspnea. +chest pain, 2 pillow orthopnea Dermatological: No rash, lesions/masses Respiratory: No cough. +dyspnea Urologic: No hematuria, dysuria Abdominal:   No vomiting, diarrhea, bright red blood per rectum, melena, or hematemesis +nausea Neurologic:  No visual changes, changes in mental status. + wkns All other systems reviewed and are otherwise negative except as noted above.  Physical Exam  Blood pressure 125/69, pulse 60, temperature 97.8 F (36.6 C), temperature source Oral, resp. rate 15, SpO2 97.00%.  General: Pleasant, NAD Psych: Normal affect. Neuro: Alert and oriented X 3. Moves all extremities spontaneously. HEENT: Normal  Neck: Supple without bruits or JVD. Lungs:  Resp regular and unlabored, bibasilar rale Heart: RRR no s3, s4, or murmurs. Abdomen: Soft,  non-tender, non-distended, BS + x 4.  Extremities: No clubbing, cyanosis or edema. DP/PT/Radials 2+ and equal bilaterally.  Labs  Troponin [facility] of Care Test) No results found for this basename: TROPIPOC,  in the last 72 hours  Recent Labs  05/27/14 1040  TROPONINI <0.30   Lab Results  Component Value Date   WBC 10.0 05/27/2014   HGB 11.6* 05/27/2014   HCT 33.5* 05/27/2014   MCV 88.6 05/27/2014   PLT 287 05/27/2014    Recent Labs Lab 05/27/14 1040  NA 129*  K 4.9  CL 93*  CO2 23  BUN 17  CREATININE 0.95  CALCIUM 9.3  PROT 7.5  BILITOT 0.6  ALKPHOS 111  ALT 12  AST 21  GLUCOSE 102*   Lab Results  Component Value Date   CHOL 161 08/26/2012   HDL 40 08/26/2012   LDLCALC 68 08/26/2012   TRIG 265* 08/26/2012   No results found for this basename: DDIMER     Radiology/Studies  Dg Chest Port 1 View  05/27/2014   CLINICAL DATA:  Dizziness with shortness of breath and nausea and hypertension  EXAM: PORTABLE CHEST - 1 VIEW  COMPARISON:  PA and lateral chest of July 04, 2012  FINDINGS: The lungs are mildly hypoinflated. The interstitial markings are increased. There are confluent alveolar densities consistent with edema. There small amounts of pleural fluid blunting the costophrenic angles. The mediastinum is normal in width. The cardiac silhouette is top-normal. There is severe degenerative change of the left glenohumeral joint.  IMPRESSION: Congestive heart failure with mild pulmonary interstitial and alveolar edema. Pneumonia is felt less likely. When the patient can tolerate the procedure, a PA and lateral chest x-ray would be useful.   Electronically Signed   By: David  [place]   On: 05/27/2014 10:01    ECG  EKG showed normal sinus rhythm, with incomplete left bundle branch block, and prolonged PR interval  Echocardiogram  08/25/2012  LV EF: 60% - 65%  ------------------------------------------------------------ Indications: CVA  436.  ------------------------------------------------------------ History: PMH: Chronic obstructive pulmonary disease. Risk factors: Hypertension.  ------------------------------------------------------------ Study Conclusions  - Left ventricle: The cavity size was normal. There was moderate focal basal hypertrophy of the septum. Systolic function was normal. The estimated ejection fraction was in the range of 60% to 65%. Wall motion was normal; there were no regional wall motion abnormalities. Doppler parameters are consistent with abnormal left ventricular relaxation (grade 1 diastolic dysfunction). - Aortic valve: There was mild stenosis. Trivial regurgitation. Valve area: 2.12cm^2(VTI). Valve area: 2.07cm^2 (Vmax). - Mitral valve: Calcified annulus. - Left atrium: The atrium was moderately dilated.      ASSESSMENT AND  PLAN  1. Pulm edema in the setting malignant HTN  - control BP, received single dose of  IV lasix in the ED  - will monitor overnight and decide on dosage of lasix in am base on her response to IV lasix  - monitor Na closely as she has a h/o relative hyponatremia with baseline Na 127-130s  2. Acute on chronic diastolic heart failure: see #1  - repeat Echo  3. chronic hyponatremia, baseline Na 127-130  - monitor Na closely with diuretic 4. history of paroxysmal atrial fibrillation controlled on flecainide and Coumadin  - negative stress test in Jan 2012, widening QRS since 2012, may consider repeat stress test later as outpt if has recurrent CP  5. Hypertension 6. Hyperlipidemia 7. multiple TIA 8. Emphysema 9. GERD 10. Chest pressure: discussed with Dr. Myrtis Ser, would monitor for now, trend enzyme overnight. Was not associated with exertion.   Ramond Dial, PA-C 05/27/2014, 1:09 PM Patient seen and examined. I agree with the assessment and plan as detailed above. See also my additional thoughts below. I have reviewed carefully roams one through  10 listed above.    Hyponatremia    History when the patient did not tolerate chlorthalidone. There is a history of sodium ranging between 128 and 130. Regardless of the sodium numbers in the past, the patient is currently volume overloaded. She is already diuresis with Lasix in the emergency room. Her sodium will have to be followed carefully.    COPD (chronic obstructive pulmonary disease)     Her lung disease plays a role with her intermittent shortness of breath. However at this time she appears volume overloaded.    TIA (transient ischemic attack)     She does have a history of a TIA. She is anticoagulated and it is important that this is continued.    PAF NSR on Flecanide     I will order a trough flecainide level to be obtained tomorrow morning before her dose of flecainide. This will take several days to return. However it will be helpful to reassess her level.    Chronic anticoagulation     Her Coumadin will be held tonight. This will be just in case decision is made to proceed with cardiac catheterization. We will need to see all of her enzymes before this decision can be made. It will be important to restart her Coumadin tomorrow if we do not plan on other procedures.    Aortic valve stenosis, mild    CHF with unknown LVEF     It is possible that many of her symptoms could be related to CHF. However the overall picture is not classic. She does need to be diaries.   Hypertension     Blood pressure will be followed as she is diaries to see what other changes need to be made.    Chest discomfort     The patient had some chest heaviness after arriving in the emergency room. This improved before she received nitroglycerin. She said that she has not had this over time. This discomfort is concerning. She also mentions that frequently she gets up during the night to use the bathroom at home. She then returns and when she lies down to get in bed she feels an unusual sensation in her chest  that is relieved with belching. It is not clear if this represents angina or not. If her troponins become abnormal cardiac catheterization should definitely be done this admission. If she diuresis and feels  much better and her LV function is still good by echo, consideration could be given to following up with an outpatient nuclear study.    Willa Rough, MD, [facility] 05/27/2014 2:07 PM

## 2014-05-27 NOTE — ED Notes (Signed)
Diet ordered 

## 2014-05-27 NOTE — Progress Notes (Signed)
ANTICOAGULATION CONSULT NOTE - Initial Consult  Pharmacy Consult for heparin Indication: atrial fibrillation  Allergies  Allergen Reactions  . Sulfa Antibiotics Other (See Comments)    Madeline Serrano  . Celebrex [Celecoxib] Swelling  . Chlorthalidone Nausea Only    Lightheadedness, weakness, so "sick feeling"  . Clarithromycin Swelling  . Other     EXTENDED RELEASE tablets  . Statins Other (See Comments)    Hair fell out  . Vioxx [Rofecoxib] Other (See Comments)    Renal problems  . Penicillins Rash    Patient Measurements: Height: 5' 5.5" (166.4 cm) Weight: 156 lb 14.4 oz (71.169 kg) IBW/kg (Calculated) : 58.15  Vital Signs: Temp: 97.8 F (36.6 C) (08/24 2044) Temp src: Oral (08/24 2044) BP: 133/57 mmHg (08/24 2044) Pulse Rate: 57 (08/24 2044)  Labs:  Recent Labs  05/27/14 1040 05/27/14 1939  HGB 11.6*  --   HCT 33.5*  --   PLT 287  --   LABPROT 32.1*  --   INR 3.12*  --   CREATININE 0.95  --   TROPONINI <0.30 <0.30    Estimated Creatinine Clearance: 45.7 ml/min (by C-G formula based on Cr of 0.95).   Medical History: Past Medical History  Diagnosis Date  . PAF (paroxysmal atrial fibrillation)     NSR on Flecanide  . Hypertension   . High cholesterol   . Macular degeneration   . GERD (gastroesophageal reflux disease)   . TIA (transient ischemic attack)     several in past  . DJD (degenerative joint disease) 10/13    s/p rt THR  . Chronic anticoagulation   . Aortic valve stenosis, mild 11/13    Medications:  Prescriptions prior to admission  Medication Sig Dispense Refill  . aspirin 81 MG chewable tablet Chew 1 tablet (81 mg total) by mouth daily.      Marland Kitchen CALCIUM PO Take 1,800 mg by mouth daily.      . carvedilol (COREG) 12.5 MG tablet Take 12.5 mg by mouth 2 (two) times daily with a meal.       . fexofenadine (ALLEGRA) 180 MG tablet Take 180 mg by mouth daily as needed for allergies.       . fish oil-omega-3 fatty acids 1000 MG capsule  Take 1 g by mouth daily.      . flecainide (TAMBOCOR) 100 MG tablet Take 1 tablet (100 mg total) by mouth 2 (two) times daily.  180 tablet  2  . lansoprazole (PREVACID) 15 MG capsule Take 15 mg by mouth daily as needed (reflux).       . Multiple Vitamin (MULTIVITAMIN WITH MINERALS) TABS Take 1 tablet by mouth daily.      . Multiple Vitamins-Minerals (ICAPS) TABS Take 2 tablets by mouth 2 (two) times daily.      . nitrofurantoin (MACRODANTIN) 100 MG capsule Take 100 mg by mouth daily.       . Pitavastatin Calcium (LIVALO) 1 MG TABS Take 1 mg by mouth daily.      Marland Kitchen tiotropium (SPIRIVA) 18 MCG inhalation capsule Place 18 mcg into inhaler and inhale daily.      Marland Kitchen warfarin (COUMADIN) 2 MG tablet Take 2-4 mg by mouth See admin instructions. Take 4 mg every Tuesday,thurs & Saturday then 2 mg the rest of the days       Scheduled:  . [START ON 05/28/2014] aspirin  81 mg Oral Daily  . calcium carbonate  1 tablet Oral TID  . carvedilol  12.5 mg  Oral BID WC  . flecainide  100 mg Oral BID  . [START ON 05/28/2014] furosemide  40 mg Oral Once  . multivitamin  1 tablet Oral BID  . multivitamin with minerals  1 tablet Oral Daily  . [START ON 05/28/2014] nitrofurantoin  100 mg Oral Daily  . nitroGLYCERIN  1 inch Topical 4 times per day  . omega-3 acid ethyl esters  1 g Oral Daily  . [START ON 05/28/2014] pantoprazole  20 mg Oral Daily  . sodium chloride  3 mL Intravenous Q12H  . [START ON 05/28/2014] tiotropium  18 mcg Inhalation Daily    Assessment/Plan:  80yo female c/o dizziness/nausea/SOB, admitted for pulmonary edema in setting of malignant HTN, some concern for ACS so Coumadin being held, to resume if no procedure planned but pharmacy asked to monitor INR and start heparin if INR dips below 2, currently 3.  Will monitor INR and start heparin when appropriate vs f/u to resume Coumadin.  Madeline Serrano, PharmD, BCPS  05/27/2014,11:48 PM

## 2014-05-28 DIAGNOSIS — R079 Chest pain, unspecified: Secondary | ICD-10-CM | POA: Diagnosis not present

## 2014-05-28 DIAGNOSIS — I359 Nonrheumatic aortic valve disorder, unspecified: Secondary | ICD-10-CM

## 2014-05-28 LAB — BASIC METABOLIC PANEL
Anion gap: 12 (ref 5–15)
BUN: 21 mg/dL (ref 6–23)
CO2: 25 mEq/L (ref 19–32)
Calcium: 9.6 mg/dL (ref 8.4–10.5)
Chloride: 90 mEq/L — ABNORMAL LOW (ref 96–112)
Creatinine, Ser: 1.3 mg/dL — ABNORMAL HIGH (ref 0.50–1.10)
GFR calc Af Amer: 43 mL/min — ABNORMAL LOW (ref >90)
GFR calc non Af Amer: 37 mL/min — ABNORMAL LOW (ref >90)
GLUCOSE: 91 mg/dL (ref 70–99)
POTASSIUM: 4.5 meq/L (ref 3.7–5.3)
SODIUM: 127 meq/L — AB (ref 137–147)

## 2014-05-28 LAB — PROTIME-INR
INR: 3.59 — AB (ref 0.00–1.49)
Prothrombin Time: 35.8 seconds — ABNORMAL HIGH (ref 11.6–15.2)

## 2014-05-28 LAB — TROPONIN I: Troponin I: <0.3 ng/mL (ref <0.30)

## 2014-05-28 MED ORDER — WARFARIN - PHARMACIST DOSING INPATIENT: Freq: Every day | Status: DC

## 2014-05-28 MED ORDER — MENTHOL 3 MG MT LOZG
1.0000 | LOZENGE | OROMUCOSAL | Status: DC | PRN
  Administered 2014-05-28 – 2014-05-29 (×3): 3 mg via ORAL
  Filled 2014-05-28 (×2): qty 9

## 2014-05-28 NOTE — Progress Notes (Signed)
Pt's BP at 1800 which was 106/56. Pt sitting in chair eating dinner and due to receive Coreg. Pt c/o weakness, dizziness, and nausea. Got pt back in bed and paged PA on call to see whether or not to give Coreg. Advised by PA to hold Coreg this evening and use PRN IV hydralizine if needed. Will continue to monitor pt closely.

## 2014-05-28 NOTE — Progress Notes (Signed)
  Echocardiogram 2D Echocardiogram has been performed.  Madeline Serrano 05/28/2014, 10:17 AM

## 2014-05-28 NOTE — Evaluation (Signed)
Physical Therapy Evaluation Patient Details Name: Madeline Serrano MRN: [id number] DOB: January 26, 1930 Today's Date: 05/28/2014   History of Present Illness  80 year old Caucasian female with past medical history significant for chronic diastolic heart failure, chronic hyponatremia, history of paroxysmal atrial fibrillation controlled on flecainide and Coumadin, hypertension, hyperlipidemia, multiple TIA and GERD present with dizziness, nausea, SOB and chest pressure. Found to have pulm edema in the setting of malignant hypertension  Clinical Impression  Very pleasant lady with a sore throat at present. Pt states she is normally very independent living alone and plans to return home without assist. Pt with decreased activity tolerance, feeling lightheaded with mobility and unable to tolerated ambulation out of room. Pt reports chronic issues with hyponatremia (repleated earlier today) and varied BP and feels that both are low causing her to feel lightheaded and fatigued. Pt denied further mobility at this time but will benefit from acute therapy to maximize mobility, gait and balance to return pt to PLOF as she normally ambulates with a cane. REcommend OOB for all meals and daily ambulation with nursing staff.     Follow Up Recommendations Home health PT    Equipment Recommendations  None recommended by PT    Recommendations for Other Services       Precautions / Restrictions Precautions Precautions: Fall      Mobility  Bed Mobility Overal bed mobility: Modified Independent                Transfers Overall transfer level: Needs assistance   Transfers: Sit to/from Stand Sit to Stand: Supervision         General transfer comment: supervision for safety as pt with partial posterior LOB with initial standing  Ambulation/Gait Ambulation/Gait assistance: Min guard Ambulation Distance (Feet): 30 Feet Assistive device: Rolling walker (2 wheeled) Gait Pattern/deviations: Step-through  pattern;Decreased stride length   Gait velocity interpretation: Below normal speed for age/gender General Gait Details: 15' to bathroom with cane with increased unsteadiness  return ambulation with RW with improved gait and only supervision for safety  Stairs            Wheelchair Mobility    Modified Rankin (Stroke Patients Only)       Balance Overall balance assessment: Needs assistance   Sitting balance-Leahy Scale: Good       Standing balance-Leahy Scale: Fair                               Pertinent Vitals/Pain Pain Assessment: No/denies pain sats 93% on RA HR 73    Home Living Family/patient expects to be discharged to:: Private residence Living Arrangements: Alone Available Help at Discharge: Available PRN/intermittently;Personal care attendant Type of Home: House Home Access: Stairs to enter Entrance Stairs-Rails: None Entrance Stairs-Number of Steps: 2 Home Layout: Two level;Able to live on main level with bedroom/bathroom Home Equipment: Dan Humphreys - 2 wheels;Cane - single point;Grab bars - tub/shower;Shower seat - built in;Bedside commode      Prior Function Level of Independence: Independent         Comments: Aide does housework and drives pt at times she is there for 4 hrs Monday and Friday     Hand Dominance        Extremity/Trunk Assessment   Upper Extremity Assessment: Overall WFL for tasks assessed           Lower Extremity Assessment: Overall WFL for tasks assessed      Cervical /  Trunk Assessment: Normal  Communication   Communication: HOH  Cognition Arousal/Alertness: Awake/alert Behavior During Therapy: WFL for tasks assessed/performed Overall Cognitive Status: Within Functional Limits for tasks assessed                      General Comments      Exercises        Assessment/Plan    PT Assessment Patient needs continued PT services  PT Diagnosis Difficulty walking   PT Problem List  Decreased activity tolerance;Decreased balance;Decreased knowledge of use of DME  PT Treatment Interventions Gait training;Stair training;Functional mobility training;Therapeutic activities;Therapeutic exercise;Patient/family education;DME instruction;Balance training   PT Goals (Current goals can be found in the Care Plan section) Acute Rehab PT Goals Patient Stated Goal: return home alone PT Goal Formulation: With patient Time For Goal Achievement: 06/11/14 Potential to Achieve Goals: Good    Frequency Min 3X/week   Barriers to discharge Decreased caregiver support      Co-evaluation               End of Session   Activity Tolerance: Patient limited by fatigue Patient left: in chair;with call bell/phone within reach;with chair alarm set Nurse Communication: Mobility status         Time: 0981-1914 PT Time Calculation (min): 23 min   Charges:   PT Evaluation $Initial PT Evaluation Tier I: 1 Procedure PT Treatments $Gait Training: 8-22 mins   PT G CodesDelorse Serrano 05/28/2014, 2:45 PM Madeline Serrano, PT [phone]

## 2014-05-28 NOTE — Progress Notes (Signed)
Called by nurse for patient request of Gatorade as this is what she said she was told to drink in the past when she is symptomatic from her her sodium being low. Unfortunately she is being treated for CHF so this is not a good choice for the patient, and her low sodium may be due to hypervolemic hypotonic hyponatremia. She was given a dose of Lasix today in attempts at further diuresis. VSS. Also d/w Dr. Antoine Poche - he agrees to hold off Gatorade and feels she can be treated symptomatically for now (ie has Tylenol and zofran ordered PRN).  Liliahna Cudd PA-C

## 2014-05-28 NOTE — Progress Notes (Signed)
UR completed 

## 2014-05-28 NOTE — Progress Notes (Signed)
SUBJECTIVE:  Breathing slightly better.  No chest pressure.  She just had one episode.   PHYSICAL EXAM Filed Vitals:   05/27/14 2044 05/28/14 0233 05/28/14 0459 05/28/14 0820  BP: 133/57 149/61 148/51   Pulse: 57 61 58   Temp: 97.8 F (36.6 C) 97.9 F (36.6 C) 98 F (36.7 C)   TempSrc: Oral Oral Oral   Resp: Height:      Weight:   156 lb 9.6 oz (71.033 kg)   SpO2: 96% 97% 96% 97%   General:  No distress Lungs:  Clear Heart:  RRR Abdomen:  Positive bowel sounds, no rebound no guarding Extremities:  No edema  LABS: Lab Results  Component Value Date   TROPONINI <0.30 05/28/2014   Results for orders placed during the hospital encounter of 05/27/14 (from the past 24 hour(s))  URINALYSIS, ROUTINE W REFLEX MICROSCOPIC     Status: None   Collection Time    05/27/14  9:45 AM      Result Value Ref Range   Color, Urine YELLOW  YELLOW   APPearance CLEAR  CLEAR   Specific Gravity, Urine 1.011  1.005 - 1.030   pH 7.0  5.0 - 8.0   Glucose, UA NEGATIVE  NEGATIVE mg/dL   Hgb urine dipstick NEGATIVE  NEGATIVE   Bilirubin Urine NEGATIVE  NEGATIVE   Ketones, ur NEGATIVE  NEGATIVE mg/dL   Protein, ur NEGATIVE  NEGATIVE mg/dL   Urobilinogen, UA 0.2  0.0 - 1.0 mg/dL   Nitrite NEGATIVE  NEGATIVE   Leukocytes, UA NEGATIVE  NEGATIVE  CBC WITH DIFFERENTIAL     Status: Abnormal   Collection Time    05/27/14 10:40 AM      Result Value Ref Range   WBC 10.0  4.0 - 10.5 K/uL   RBC 3.78 (*) 3.87 - 5.11 MIL/uL   Hemoglobin 11.6 (*) 12.0 - 15.0 g/dL   HCT 21.3 (*) 08.6 - 57.8 %   MCV 88.6  78.0 - 100.0 fL   MCH 30.7  26.0 - 34.0 pg   MCHC 34.6  30.0 - 36.0 g/dL   RDW 46.9  62.9 - 52.8 %   Platelets 287  150 - 400 K/uL   Neutrophils Relative % 64  43 - 77 %   Neutro Abs 6.4  1.7 - 7.7 K/uL   Lymphocytes Relative 15  12 - 46 %   Lymphs Abs 1.5  0.7 - 4.0 K/uL   Monocytes Relative 8  3 - 12 %   Monocytes Absolute 0.8  0.1 - 1.0 K/uL   Eosinophils Relative 12 (*) 0 - 5 %   Eosinophils Absolute 1.2 (*) 0.0 - 0.7 K/uL   Basophils Relative 1  0 - 1 %   Basophils Absolute 0.1  0.0 - 0.1 K/uL  COMPREHENSIVE METABOLIC PANEL     Status: Abnormal   Collection Time    05/27/14 10:40 AM      Result Value Ref Range   Sodium 129 (*) 137 - 147 mEq/L   Potassium 4.9  3.7 - 5.3 mEq/L   Chloride 93 (*) 96 - 112 mEq/L   CO2 23  19 - 32 mEq/L   Glucose, Bld 102 (*) 70 - 99 mg/dL   BUN 17  6 - 23 mg/dL   Creatinine, Ser 4.13  0.50 - 1.10 mg/dL   Calcium 9.3  8.4 - 24.4 mg/dL   Total Protein 7.5  6.0 - 8.3 g/dL  Albumin 3.4 (*) 3.5 - 5.2 g/dL   AST 21  0 - 37 U/L   ALT 12  0 - 35 U/L   Alkaline Phosphatase 111  39 - 117 U/L   Total Bilirubin 0.6  0.3 - 1.2 mg/dL   GFR calc non Af Amer 54 (*) >90 mL/min   GFR calc Af Amer 63 (*) >90 mL/min   Anion gap 13  5 - 15  TROPONIN I     Status: None   Collection Time    05/27/14 10:40 AM      Result Value Ref Range   Troponin I <0.30  <0.30 ng/mL  PRO B NATRIURETIC PEPTIDE     Status: Abnormal   Collection Time    05/27/14 10:40 AM      Result Value Ref Range   Pro B Natriuretic peptide (BNP) 1295.0 (*) 0 - 450 pg/mL  PROTIME-INR     Status: Abnormal   Collection Time    05/27/14 10:40 AM      Result Value Ref Range   Prothrombin Time 32.1 (*) 11.6 - 15.2 seconds   INR 3.12 (*) 0.00 - 1.49  TROPONIN I     Status: None   Collection Time    05/27/14  7:39 PM      Result Value Ref Range   Troponin I <0.30  <0.30 ng/mL  TROPONIN I     Status: None   Collection Time    05/28/14 12:09 AM      Result Value Ref Range   Troponin I <0.30  <0.30 ng/mL  BASIC METABOLIC PANEL     Status: Abnormal   Collection Time    05/28/14  4:17 AM      Result Value Ref Range   Sodium 127 (*) 137 - 147 mEq/L   Potassium 4.5  3.7 - 5.3 mEq/L   Chloride 90 (*) 96 - 112 mEq/L   CO2 25  19 - 32 mEq/L   Glucose, Bld 91  70 - 99 mg/dL   BUN 21  6 - 23 mg/dL   Creatinine, Ser 3.81 (*) 0.50 - 1.10 mg/dL   Calcium 9.6  8.4 - 01.7 mg/dL    GFR calc non Af Amer 37 (*) >90 mL/min   GFR calc Af Amer 43 (*) >90 mL/min   Anion gap 12  5 - 15  PROTIME-INR     Status: Abnormal   Collection Time    05/28/14  4:17 AM      Result Value Ref Range   Prothrombin Time 35.8 (*) 11.6 - 15.2 seconds   INR 3.59 (*) 0.00 - 1.49  TROPONIN I     Status: None   Collection Time    05/28/14  6:31 AM      Result Value Ref Range   Troponin I <0.30  <0.30 ng/mL    Intake/Output Summary (Last 24 hours) at 05/28/14 0844 Last data filed at 05/28/14 5102  Gross per 24 hour  Intake    240 ml  Output   2300 ml  Net  -2060 ml     ASSESSMENT AND PLAN:  CHEST DISCOMFORT:  Enzymes negative.  I will not be planning a cath this admission.  Restart warfarin (althought the INR is currently high).   HTN:  BP is running high but fluctuating.  No change in meds today.    ACUTE HEART FAILURE:  Diuresis as above.  Her ProBNP was elevated.  I will continue the current diuretic  today.  Echo is pending.  Na is stable.   PAF:  Flecainide level has been drawn.   [facility]ayrene [facility] 05/28/2014 8:44 AM

## 2014-05-28 NOTE — Progress Notes (Signed)
ANTICOAGULATION CONSULT NOTE - Follow Up Consult  Pharmacy Consult for warfarin Indication: atrial fibrillation  Allergies  Allergen Reactions  . Sulfa Antibiotics Other (See Comments)    [name]  . Celebrex [Celecoxib] Swelling  . Chlorthalidone Nausea Only    Lightheadedness, weakness, so "sick feeling"  . Clarithromycin Swelling  . Other     EXTENDED RELEASE tablets  . Statins Other (See Comments)    Hair fell out  . Vioxx [Rofecoxib] Other (See Comments)    Renal problems  . Penicillins Rash    Patient Measurements: Height: 5' 5.5" (166.4 cm) Weight: 156 lb 9.6 oz (71.033 kg) (bed scale) IBW/kg (Calculated) : 58.15  Vital Signs: Temp: 98 F (36.7 C) (08/25 0459) Temp src: Oral (08/25 0459) BP: 148/51 mmHg (08/25 0459) Pulse Rate: 58 (08/25 0459)  Labs:  Recent Labs  05/27/14 1040 05/27/14 1939 05/28/14 0009 05/28/14 0417 05/28/14 0631  HGB 11.6*  --   --   --   --   HCT 33.5*  --   --   --   --   PLT 287  --   --   --   --   LABPROT 32.1*  --   --  35.8*  --   INR 3.12*  --   --  3.59*  --   CREATININE 0.95  --   --  1.30*  --   TROPONINI <0.30 <0.30 <0.30  --  <0.30    Estimated Creatinine Clearance: 33.3 ml/min (by C-G formula based on Cr of 1.3).   Assessment: 80 y/o female who presented to the ED with dizziness, nausea, SOB, and chest pressure admitted for pulmonary edema in setting of malignant HTN. She takes warfarin for hx Afib and pharmacy consulted to resume since no cath planned this admit. INR is SUPRAtherapeutic at 3.59 even though dose held last night. No bleeding noted, CBC is normal.  PTA regimen: 2 mg daily except 4 mg TTS  Goal of Therapy:  INR 2-3 Monitor platelets by anticoagulation protocol: Yes   Plan:  - No warfarin tonight - INR daily  [facility], [address].D., BCPS Clinical Pharmacist Pager: [phone] 05/28/2014 10:25 AM

## 2014-05-29 DIAGNOSIS — R079 Chest pain, unspecified: Secondary | ICD-10-CM | POA: Diagnosis not present

## 2014-05-29 DIAGNOSIS — R0789 Other chest pain: Secondary | ICD-10-CM

## 2014-05-29 LAB — PROTIME-INR
INR: 3.32 — ABNORMAL HIGH (ref 0.00–1.49)
Prothrombin Time: 33.7 seconds — ABNORMAL HIGH (ref 11.6–15.2)

## 2014-05-29 LAB — BASIC METABOLIC PANEL
Anion gap: 11 (ref 5–15)
BUN: 24 mg/dL — AB (ref 6–23)
CHLORIDE: 91 meq/L — AB (ref 96–112)
CO2: 27 mEq/L (ref 19–32)
CREATININE: 1.36 mg/dL — AB (ref 0.50–1.10)
Calcium: 9.7 mg/dL (ref 8.4–10.5)
GFR calc Af Amer: 41 mL/min — ABNORMAL LOW (ref >90)
GFR calc non Af Amer: 35 mL/min — ABNORMAL LOW (ref >90)
Glucose, Bld: 100 mg/dL — ABNORMAL HIGH (ref 70–99)
Potassium: 4.3 mEq/L (ref 3.7–5.3)
Sodium: 129 mEq/L — ABNORMAL LOW (ref 137–147)

## 2014-05-29 MED ORDER — WARFARIN SODIUM 1 MG PO TABS
1.0000 mg | ORAL_TABLET | Freq: Once | ORAL | Status: AC
  Administered 2014-05-29: 1 mg via ORAL
  Filled 2014-05-29: qty 1

## 2014-05-29 NOTE — Progress Notes (Signed)
ANTICOAGULATION CONSULT NOTE - Follow Up Consult  Pharmacy Consult for warfarin Indication: atrial fibrillation  Allergies  Allergen Reactions  . Sulfa Antibiotics Other (See Comments)    [name]  . Celebrex [Celecoxib] Swelling  . Chlorthalidone Nausea Only    Lightheadedness, weakness, so "sick feeling"  . Clarithromycin Swelling  . Other     EXTENDED RELEASE tablets  . Statins Other (See Comments)    Hair fell out  . Vioxx [Rofecoxib] Other (See Comments)    Renal problems  . Penicillins Rash    Patient Measurements: Height: 5' 5.5" (166.4 cm) Weight: 156 lb 1.4 oz (70.8 kg) IBW/kg (Calculated) : 58.15  Vital Signs: Temp: 97.7 F (36.5 C) (08/26 0945) Temp src: Oral (08/26 0945) BP: 148/57 mmHg (08/26 0945) Pulse Rate: 59 (08/26 0945)  Labs:  Recent Labs  05/27/14 1040 05/27/14 1939 05/28/14 0009 05/28/14 0417 05/28/14 0631 05/29/14 0354  HGB 11.6*  --   --   --   --   --   HCT 33.5*  --   --   --   --   --   PLT 287  --   --   --   --   --   LABPROT 32.1*  --   --  35.8*  --  33.7*  INR 3.12*  --   --  3.59*  --  3.32*  CREATININE 0.95  --   --  1.30*  --  1.36*  TROPONINI <0.30 <0.30 <0.30  --  <0.30  --     Estimated Creatinine Clearance: 31.8 ml/min (by C-G formula based on Cr of 1.36).   Assessment: 80 y/o female who presented to the ED with dizziness, nausea, SOB, and chest pressure admitted for pulmonary edema in setting of malignant HTN. She takes warfarin for hx Afib and pharmacy consulted to resume since no cath planned this admit. INR is SUPRAtherapeutic at 3.32 but trending down. No bleeding noted, CBC is normal. Eating 10-75% of meals.  PTA regimen: 2 mg daily except 4 mg TTS  Goal of Therapy:  INR 2-3 Monitor platelets by anticoagulation protocol: Yes   Plan:  - Warfarin 1 mg PO tonight - INR daily  [facility], [address].D., BCPS Clinical Pharmacist Pager: [phone] 05/29/2014 12:47 PM

## 2014-05-29 NOTE — Progress Notes (Signed)
UR completed 

## 2014-05-29 NOTE — Progress Notes (Signed)
Physical Therapy Treatment Patient Details Name: Madeline Serrano MRN: [id number] DOB: November 21, 1929 Today's Date: 05/29/2014    History of Present Illness 80 year old Caucasian female with past medical history significant for chronic diastolic heart failure, chronic hyponatremia, history of paroxysmal atrial fibrillation controlled on flecainide and Coumadin, hypertension, hyperlipidemia, multiple TIA and GERD present with dizziness, nausea, SOB and chest pressure. Found to have pulm edema in the setting of malignant hypertension    PT Comments    Making progress with mobility; Reports some dizziness that was unchanged during walk, pt attributes to decr sodium  Follow Up Recommendations  Home health PT     Equipment Recommendations  None recommended by PT    Recommendations for Other Services       Precautions / Restrictions Precautions Precautions: Fall    Mobility  Bed Mobility Overal bed mobility: Modified Independent                Transfers Overall transfer level: Needs assistance Equipment used: Rolling walker (2 wheeled) Transfers: Sit to/from Stand Sit to Stand: Supervision         General transfer comment: Noted pt braced backs of LEs against bed for steadiness with standing  Ambulation/Gait Ambulation/Gait assistance: Min guard (without physical contact) Ambulation Distance (Feet): 120 Feet Assistive device: Rolling walker (2 wheeled) Gait Pattern/deviations: Step-through pattern;Trunk flexed     General Gait Details: Cues to self-monitor for activity tolerance; Reports some dizziness that was unchanged during walk, pt attributes to decr sodium; Good use of rW`   Stairs            Wheelchair Mobility    Modified Rankin (Stroke Patients Only)       Balance             Standing balance-Leahy Scale: Fair                      Cognition Arousal/Alertness: Awake/alert Behavior During Therapy: WFL for tasks  assessed/performed Overall Cognitive Status: Within Functional Limits for tasks assessed                      Exercises      General Comments        Pertinent Vitals/Pain Pain Assessment: No/denies pain    Home Living                      Prior Function            PT Goals (current goals can now be found in the care plan section) Acute Rehab PT Goals Patient Stated Goal: return home alone PT Goal Formulation: With patient Time For Goal Achievement: 06/11/14 Potential to Achieve Goals: Good Progress towards PT goals: Progressing toward goals    Frequency  Min 3X/week    PT Plan Current plan remains appropriate    Co-evaluation             End of Session Equipment Utilized During Treatment: Gait belt Activity Tolerance: Patient tolerated treatment well Patient left: in chair;with call bell/phone within reach;with chair alarm set     Time: 1478-2956 PT Time Calculation (min): 20 min  Charges:  $Gait Training: 8-22 mins                    G Codes:      Olen Pel 05/29/2014, 11:27 AM  Van Clines, PT  Acute Rehabilitation Services Pager [phone] Office [phone]

## 2014-05-29 NOTE — Progress Notes (Signed)
    SUBJECTIVE: No chest pressure.  She is weak and light headed.     PHYSICAL EXAM Filed Vitals:   05/28/14 1800 05/28/14 1844 05/28/14 2042 05/29/14 0623  BP: 106/56 138/72 131/54 152/49  Pulse: 61 61 61 61  Temp:   97.6 F (36.4 C) 98 F (36.7 C)  TempSrc:   Oral Oral  Resp:   18 16  Height:      Weight:    156 lb 1.4 oz (70.8 kg)  SpO2:   95% 95%   General:  No distress Lungs:  Clear Heart:  RRR Abdomen:  Positive bowel sounds, no rebound no guarding Extremities:  No edema  LABS: Lab Results  Component Value Date   TROPONINI <0.30 05/28/2014   Results for orders placed during the hospital encounter of 05/27/14 (from the past 24 hour(s))  BASIC METABOLIC PANEL     Status: Abnormal   Collection Time    05/29/14  3:54 AM      Result Value Ref Range   Sodium 129 (*) 137 - 147 mEq/L   Potassium 4.3  3.7 - 5.3 mEq/L   Chloride 91 (*) 96 - 112 mEq/L   CO2 27  19 - 32 mEq/L   Glucose, Bld 100 (*) 70 - 99 mg/dL   BUN 24 (*) 6 - 23 mg/dL   Creatinine, Ser 1.61 (*) 0.50 - 1.10 mg/dL   Calcium 9.7  8.4 - 09.6 mg/dL   GFR calc non Af Amer 35 (*) >90 mL/min   GFR calc Af Amer 41 (*) >90 mL/min   Anion gap 11  5 - 15  PROTIME-INR     Status: Abnormal   Collection Time    05/29/14  3:54 AM      Result Value Ref Range   Prothrombin Time 33.7 (*) 11.6 - 15.2 seconds   INR 3.32 (*) 0.00 - 1.49    Intake/Output Summary (Last 24 hours) at 05/29/14 0842 Last data filed at 05/29/14 0454  Gross per 24 hour  Intake    520 ml  Output   1600 ml  Net  -1080 ml     ASSESSMENT AND PLAN:  CHEST DISCOMFORT:  Enzymes negative.  I will not be planning a cath this admission.  Restart warfarin (althought the INR is currently high).   HTN:  BP is fluctuating.  No change in meds today.    ACUTE HEART FAILURE with Preserved EF:  Diuresis as above.  Her ProBNP was elevated.  Na is stable low.   Good diuresis.  Echo with preserved EF.  I will hold her diuretic.   PAF:  Flecainide  level has been drawn.   WEAKNESS:  She says that she is too weak to go home.  Home in the AM after she ambulates with PT.  Rollene Rotunda 05/29/2014 8:42 AM

## 2014-05-30 DIAGNOSIS — R079 Chest pain, unspecified: Secondary | ICD-10-CM | POA: Diagnosis not present

## 2014-05-30 LAB — BASIC METABOLIC PANEL
Anion gap: 13 (ref 5–15)
BUN: 22 mg/dL (ref 6–23)
CALCIUM: 9.2 mg/dL (ref 8.4–10.5)
CHLORIDE: 91 meq/L — AB (ref 96–112)
CO2: 25 meq/L (ref 19–32)
Creatinine, Ser: 1.2 mg/dL — ABNORMAL HIGH (ref 0.50–1.10)
GFR calc Af Amer: 47 mL/min — ABNORMAL LOW (ref >90)
GFR calc non Af Amer: 41 mL/min — ABNORMAL LOW (ref >90)
GLUCOSE: 102 mg/dL — AB (ref 70–99)
POTASSIUM: 4.4 meq/L (ref 3.7–5.3)
SODIUM: 129 meq/L — AB (ref 137–147)

## 2014-05-30 LAB — PROTIME-INR
INR: 2.76 — ABNORMAL HIGH (ref 0.00–1.49)
Prothrombin Time: 29.2 seconds — ABNORMAL HIGH (ref 11.6–15.2)

## 2014-05-30 MED ORDER — NITROGLYCERIN 0.4 MG SL SUBL: 0.4000 mg | SUBLINGUAL_TABLET | SUBLINGUAL | Status: AC | PRN

## 2014-05-30 MED ORDER — FUROSEMIDE 20 MG PO TABS: 20.0000 mg | ORAL_TABLET | Freq: Every day | ORAL | Status: AC | PRN

## 2014-05-30 NOTE — Care Management Note (Addendum)
  Page 1 of 1   05/30/2014     4:34:20 PM CARE MANAGEMENT NOTE 05/30/2014  Patient:  Madeline Serrano, Madeline Serrano   Account Number:  [id number]  Date Initiated:  05/30/2014  Documentation initiated by:  Christipher Rieger  Subjective/Objective Assessment:   Acute on Chronic CHF     Action/Plan:   CM to follow for disposition needs   Anticipated DC Date:  05/30/2014   Anticipated DC Plan:  HOME W HOME HEALTH SERVICES      DC Planning Services  CM consult      [facility] Choice  HOME HEALTH   Choice offered to / List presented to:  C-1 Patient           Status of service:  Completed, signed off Medicare Important Message given?   (If response is "NO", the following Medicare IM given date fields will be blank) Date Medicare IM given:   Medicare IM given by:   Date Additional Medicare IM given:   Additional Medicare IM given by:    Discharge Disposition:  HOME W HOME HEALTH SERVICES  Per UR Regulation:    If discussed at Long Length of Stay Meetings, dates discussed:    Comments:  Harlo Fabela RN, BSN, MSHL, CCM  Nurse - Case Manager,  ([employer])  [phone]  05/30/2014 IM:  n/a - observation CHF, weakness Social:  from home alone but has a son and [employer] Private pay caregiver 4 hours / day x 2 days / week - Provider:  Rolla Plate PT RECS:  HH PT Disposition:  Home with HHS:  RN, PT  however, patient refuses services stating her immobility is d/t s/e of statins; states she does not have an option of not taking this medication; therefore does not feel PT will help a chronic problem.  Staes she has had HHPT services in the past post hip surgery.   States she refuses to have knee replacement surgery. Disposition Plan Update:  home with private pay sitter.

## 2014-05-30 NOTE — Progress Notes (Signed)
Received call from nurse stating patient was orthostatic and nauseated.  ON:GEXBM 118/53, 68       Sitting 136/46, 71       Standing 97/51, 62  Discussed with Dr. Antoine Poche who felt patient was still ok for discharge. Patient states she has plans to follow up with her PCP.   Eula Listen, PA-C 05/30/2014 11:04 AM

## 2014-05-30 NOTE — Plan of Care (Signed)
Problem: Phase I Progression Outcomes Goal: Hemodynamically stable Outcome: Progressing Continue to monitor patient's hemodynamics.  BP fluctuates, HR stable.  Will continue to monitor patient condition.

## 2014-05-30 NOTE — Progress Notes (Signed)
Discharge instructions given. Pt verbalized understanding and all questions were answered.  

## 2014-05-30 NOTE — Discharge Summary (Signed)
Discharge Summary   Patient ID: Madeline Serrano MRN: [id number], DOB/AGE: 1930/09/06 80 y.o. Admit date: 05/27/2014 D/C date:     05/30/2014  Primary Care Provider: Martha Clan, MD Primary Cardiologist: Dr. Allyson Sabal, MD   Primary Discharge Diagnoses:  1. Chest pain likely 2/2 GERD vs MSK 2. HTN 3. Acute HF with preserved EF 4. PAF 5. Hyponatremia  6. Weakness  Secondary Discharge Diagnoses:  1. HLD 2. GERD 3. Multiple TIA 4. Chronic anticoagulation 5. Mild aortic valve stenosis  Hospital Course: 80 year old female with past medical history significant for chronic diastolic heart failure, chronic hyponatremia, history of paroxysmal atrial fibrillation controlled on flecainide and Coumadin, hypertension, hyperlipidemia, multiple TIAs and GERD. She has no known history of coronary artery disease and has never undergone a cardiac catheterization previously. She had a negative stress test in January 2012. Her last echocardiogram on 08/25/2012 showed EF 60-65%, grade 1 diastolic dysfunction, moderate focal basal hypertrophy of the septum, mild AR, moderately dilated LA. The last time she visited her cardiologist's office was on 01/16/2014, at which time she was doing well. According to family member, she has been functionally independent at home.   She has not been feeling very well for the past month. She notes intermittent episodes of fatigue, dizziness and shortness of breath, however no fevers, chills, cough, chest pain, lower extremity edema or paroxysmal nocturnal dyspnea. She has chronic two-pillow orthopnea. Her son was concerned that her emphysema medication has been recently changed, however the patient states, her emphysema medication was changed back to her old one by her PCP. She states she has been compliant with her daily medication.   Patient woke up in the morning of 05/27/2014 with severe dizziness, nausea and intermittent shortness of breath prompting her to seek medical attention  at [facility]. Upon arrival to the hospital, she was noted to have systolic blood pressure greater than 200. She was afebrile. Heart rate had been persistent in the 50s to 60s. EKG showed normal sinus rhythm, with incomplete left bundle branch block, and prolonged PR interval. Significant laboratory finding included sodium 129, potassium 4.9, albumin 3.4, troponin negative, proBNP 1300, hemoglobin 11.6 and hematocrit 33.5. INR elevated at 3.12. Urinalysis was negative for nitrite and leukocytes. Chest x-ray was concerning for congestive heart failure.   Of note, while in the ED, patient had an episode of chest pressure which lasted for approximately 5-10 minutes and resolved after nitroglycerin patch. She states she never had this chest pain before at home with exertion. Also, patient has a long-standing history of relative hyponatremia with her sodium ranging between 127 - 130 in the past year.  She did not have any further chest pain throughout her admission. Troponin negative x 4. It was determined that she would not undergo cath and her Coumadin was restarted 05/28/14. (INR 3.59). She was started on Lasix 40 mg and diuresed well (total 3,620 for admission). Echo revealed preserved EF of 55-60%, mild aortic stenosis, mild mitral regurgitation, LA mildly dilated, no patent foramen ovale. She will be discharged home with Lasix 20 mg PO prn weight gain for SOB. Na remained stable. She was seen by PT who felt she would benefit from HHPT. Her BP fluctuated between low 100s systolic to 150s systolic. She will be discharged on her home meds. PAF-Flecainide level was drawn-to be followed up on in the office. She was seen by Dr. Tamsen Meek today who felt she was stable and ready for discharge.   Discharge Vitals:  Blood pressure 156/60, pulse 65, temperature 97.9 F (36.6 C), temperature source Oral, resp. rate 20, height 5' 5.5" (1.664 m), weight 156 lb 1.4 oz (70.8 kg), SpO2 96.00%.  Labs: Lab Results    Component Value Date   WBC 10.0 05/27/2014   HGB 11.6* 05/27/2014   HCT 33.5* 05/27/2014   MCV 88.6 05/27/2014   PLT 287 05/27/2014    Recent Labs Lab 05/27/14 1040  05/29/14 0354  NA 129*  < > 129*  K 4.9  < > 4.3  CL 93*  < > 91*  CO2 23  < > 27  BUN 17  < > 24*  CREATININE 0.95  < > 1.36*  CALCIUM 9.3  < > 9.7  PROT 7.5  --   --   BILITOT 0.6  --   --   ALKPHOS 111  --   --   ALT 12  --   --   AST 21  --   --   GLUCOSE 102*  < > 100*  < > = values in this interval not displayed.  Recent Labs  05/27/14 1040 05/27/14 1939 05/28/14 0009 05/28/14 0631  TROPONINI <0.30 <0.30 <0.30 <0.30   Lab Results  Component Value Date   CHOL 161 08/26/2012   HDL 40 08/26/2012   LDLCALC 68 08/26/2012   TRIG 265* 08/26/2012   No results found for this basename: DDIMER    Diagnostic Studies/Procedures   Dg Chest Port 1 View  05/27/2014   CLINICAL DATA:  Dizziness with shortness of breath and nausea and hypertension  EXAM: PORTABLE CHEST - 1 VIEW  COMPARISON:  PA and lateral chest of July 04, 2012  FINDINGS: The lungs are mildly hypoinflated. The interstitial markings are increased. There are confluent alveolar densities consistent with edema. There small amounts of pleural fluid blunting the costophrenic angles. The mediastinum is normal in width. The cardiac silhouette is top-normal. There is severe degenerative change of the left glenohumeral joint.  IMPRESSION: Congestive heart failure with mild pulmonary interstitial and alveolar edema. Pneumonia is felt less likely. When the patient can tolerate the procedure, a PA and lateral chest x-ray would be useful.   Electronically Signed   By: David  [place]   On: 05/27/2014 10:01   Echo 05/28/14:  Study Conclusions  - Left ventricle: The cavity size was normal. Wall thickness was normal. Systolic function was normal. The estimated ejection fraction was in the range of 55% to 60%. - Aortic valve: There was mild stenosis. There was  mild regurgitation. Valve area (VTI): 2.12 cm^2. Valve area (Vmax): 1.86 cm^2. Valve area (Vmean): 1.89 cm^2. - Mitral valve: There was mild regurgitation. - Left atrium: The atrium was moderately dilated. - Atrial septum: No defect or patent foramen ovale was identified.  Consults: PT   Discharge Medications   Current Discharge Medication List    CONTINUE these medications which have NOT CHANGED   Details  aspirin 81 MG chewable tablet Chew 1 tablet (81 mg total) by mouth daily.    CALCIUM PO Take 1,800 mg by mouth daily.    carvedilol (COREG) 12.5 MG tablet Take 12.5 mg by mouth 2 (two) times daily with a meal.     fexofenadine (ALLEGRA) 180 MG tablet Take 180 mg by mouth daily as needed for allergies.     fish oil-omega-3 fatty acids 1000 MG capsule Take 1 g by mouth daily.    flecainide (TAMBOCOR) 100 MG tablet Take 1 tablet (100 mg total) by mouth  2 (two) times daily. Qty: 180 tablet, Refills: 2    lansoprazole (PREVACID) 15 MG capsule Take 15 mg by mouth daily as needed (reflux).     !! Multiple Vitamin (MULTIVITAMIN WITH MINERALS) TABS Take 1 tablet by mouth daily.    !! Multiple Vitamins-Minerals (ICAPS) TABS Take 2 tablets by mouth 2 (two) times daily.    nitrofurantoin (MACRODANTIN) 100 MG capsule Take 100 mg by mouth daily.     Pitavastatin Calcium (LIVALO) 1 MG TABS Take 1 mg by mouth daily.    tiotropium (SPIRIVA) 18 MCG inhalation capsule Place 18 mcg into inhaler and inhale daily.    warfarin (COUMADIN) 2 MG tablet Take 2-4 mg by mouth See admin instructions. Take 4 mg every Tuesday,thurs & Saturday then 2 mg the rest of the days     !! - Potential duplicate medications found. Please discuss with provider.      Disposition   The patient will be discharged in stable condition to home.  Follow-up Information   Follow up with Abelino Derrick, PA-C On 06/18/2014. (APPT 8:30 AM [facility]))    Specialty:  Cardiology   Contact information:   [address] [place] [address] [phone]         Duration of Discharge Encounter: Greater than 30 minutes including physician and PA time.  Elinor Dodge PA-C 05/30/2014, 8:43 AM

## 2014-05-30 NOTE — Progress Notes (Signed)
Progress note addendum for Physical therapy note 06-21-23  06/20/2014 1445  PT G-Codes **NOT FOR INPATIENT CLASS**  Functional Assessment Tool Used clinical judgement  Functional Limitation Mobility: Walking and moving around  Mobility: Walking and Moving Around Current Status [phone]) CI  Mobility: Walking and Moving Around Goal Status [phone]) CI  [name], PT [phone]

## 2014-05-30 NOTE — Progress Notes (Signed)
    SUBJECTIVE: No chest pressure.  She is weak but ambulated with PT yesterday.     PHYSICAL EXAM Filed Vitals:   05/29/14 1403 05/29/14 2031 05/30/14 0451 05/30/14 0617  BP: 112/57 167/59 156/60   Pulse: 60 61 59 65  Temp: 97.9 F (36.6 C) 98.1 F (36.7 C) 97.9 F (36.6 C)   TempSrc: Oral Oral Oral   Resp:  17 20   Height:      Weight:   156 lb 1.4 oz (70.8 kg)   SpO2: 94% 94% 96%    General:  No distress Lungs:  Clear Heart:  RRR Abdomen:  Positive bowel sounds, no rebound no guarding Extremities:  No edema  LABS:  No results found for this or any previous visit (from the past 24 hour(s)).  Intake/Output Summary (Last 24 hours) at 05/30/14 0754 Last data filed at 05/30/14 0454  Gross per 24 hour  Intake    840 ml  Output   1650 ml  Net   -810 ml     ASSESSMENT AND PLAN:  CHEST DISCOMFORT:  Enzymes negative. Ruled out.  No cath.    HTN:  BP is fluctuating.  Home on current meds.   ACUTE HEART FAILURE with Preserved EF:  Diuresis as above.  Her ProBNP was elevated.  Check Na today.  Home with Lasix 20 mg prn weight gain or SOB.   PAF:  Flecainide level has been drawn.  Follow up in office.    OK to discharge today.   Fayrene Fearing [facility] 05/30/2014 7:54 AM

## 2014-06-01 LAB — FLECAINIDE LEVEL: Flecainide: 0.63 ug/mL (ref 0.20–1.00)

## 2014-06-18 ENCOUNTER — Ambulatory Visit: Payer: Medicare Other | Admitting: Cardiology

## 2014-06-28 ENCOUNTER — Encounter: Payer: Self-pay | Admitting: Cardiology

## 2014-06-28 DIAGNOSIS — R9430 Abnormal result of cardiovascular function study, unspecified: Secondary | ICD-10-CM | POA: Insufficient documentation

## 2014-06-28 DIAGNOSIS — I351 Nonrheumatic aortic (valve) insufficiency: Secondary | ICD-10-CM | POA: Insufficient documentation

## 2014-06-28 DIAGNOSIS — I340 Nonrheumatic mitral (valve) insufficiency: Secondary | ICD-10-CM | POA: Insufficient documentation

## 2014-06-28 DIAGNOSIS — I350 Nonrheumatic aortic (valve) stenosis: Secondary | ICD-10-CM | POA: Insufficient documentation

## 2014-07-05 ENCOUNTER — Encounter: Payer: Self-pay | Admitting: Physician Assistant

## 2014-07-05 ENCOUNTER — Ambulatory Visit (INDEPENDENT_AMBULATORY_CARE_PROVIDER_SITE_OTHER): Payer: Medicare Other | Admitting: Physician Assistant

## 2014-07-05 VITALS — BP 230/108 | HR 63 | Ht 65.5 in | Wt 159.2 lb

## 2014-07-05 DIAGNOSIS — I480 Paroxysmal atrial fibrillation: Secondary | ICD-10-CM

## 2014-07-05 DIAGNOSIS — I5032 Chronic diastolic (congestive) heart failure: Secondary | ICD-10-CM

## 2014-07-05 DIAGNOSIS — E7800 Pure hypercholesterolemia, unspecified: Secondary | ICD-10-CM

## 2014-07-05 DIAGNOSIS — Z7901 Long term (current) use of anticoagulants: Secondary | ICD-10-CM

## 2014-07-05 DIAGNOSIS — I10 Essential (primary) hypertension: Secondary | ICD-10-CM

## 2014-07-05 NOTE — Patient Instructions (Signed)
1.  Followup with Dr. Allyson SabalBerry as scheduled

## 2014-07-05 NOTE — Progress Notes (Signed)
Date:  07/05/2014   ID:  Madeline Roenita D Pinkney, DOB 1929-11-25, MRN [id number]  PCP:  Martha ClanShaw, William, MD  Primary Cardiologist:  Allyson SabalBerry    History of Present Illness: Madeline Serrano is a 80 y.o. female with past medical history significant for chronic diastolic heart failure, chronic hyponatremia, history of paroxysmal atrial fibrillation controlled on flecainide and Coumadin, hypertension, hyperlipidemia, multiple TIAs and GERD. She has no known history of coronary artery disease and has never undergone a cardiac catheterization previously. She had a negative stress test in January 2012. Her last echocardiogram on 08/25/2012 showed EF 60-65%, grade 1 diastolic dysfunction, moderate focal basal hypertrophy of the septum, mild AR, moderately dilated LA.   Patient was admitted the end of August with acute diastolic heart failure and malignant hypertension. She had a 2-D echocardiogram which revealed preserved EF of 55-60%, mild aortic stenosis, mild mitral regurgitation, LA mildly dilated, no patent foramen ovale.  She has a long-standing history of relative hyponatremia with her sodium ranging between 127 - 130 in the past year.  Patient was diuresed and sent home on when necessary Lasix.  Patient presented today for posthospital evaluation. She reports chronic nausea and not feeling as well as she would like to. However, she is in no lower extremity edema. She does sleep on 2 pillows does not complaining of any orthopnea or PND. Her blood pressure this morning was 143/75.  However since the clinic her blood pressure was 230/108 in one arm and 220/110 in the other arm. She was given a sublingual nitroglycerin and approximately halfway through dissolving her blood pressure was rechecked and will 124/72 in the left 118/64 in the right. The rest of the nitroglycerin was thrown away. She seems to get very anxious when her blood pressure gets low however I am sure some of her nausea issues related to her blood pressure  getting so high.  She was instructed previously if her blood pressure gets elevated she said take an extra half Coreg.  The patient currently denies vomiting, fever, chest pain, shortness of breath, orthopnea, dizziness, PND, cough, congestion, abdominal pain, hematochezia, melena, lower extremity edema, claudication.  Wt Readings from Last 3 Encounters:  07/05/14 159 lb 3.2 oz (72.213 kg)  05/30/14 156 lb 1.4 oz (70.8 kg)  01/16/14 160 lb (72.576 kg)     Past Medical History  Diagnosis Date  . PAF (paroxysmal atrial fibrillation)     NSR on Flecanide  . Hypertension   . High cholesterol   . Macular degeneration   . GERD (gastroesophageal reflux disease)   . TIA (transient ischemic attack)     several in past  . DJD (degenerative joint disease) 10/13    s/p rt THR  . Chronic anticoagulation   . Aortic valve stenosis, mild 11/13  . Ejection fraction     Current Outpatient Prescriptions  Medication Sig Dispense Refill  . aspirin 81 MG chewable tablet Chew 1 tablet (81 mg total) by mouth daily.      Marland Kitchen. CALCIUM PO Take 1,800 mg by mouth daily.      . carvedilol (COREG) 12.5 MG tablet Take 0.5 tablet (6.25 mg total) by mouth every morning and take 1 tablet (12.5 mg total) by mouth every evening.      . fexofenadine (ALLEGRA) 180 MG tablet Take 180 mg by mouth daily as needed for allergies.       . fish oil-omega-3 fatty acids 1000 MG capsule Take 1 g by mouth daily.      .Marland Kitchen  flecainide (TAMBOCOR) 100 MG tablet Take 1 tablet (100 mg total) by mouth 2 (two) times daily.  180 tablet  2  . furosemide (LASIX) 20 MG tablet Take 1 tablet (20 mg total) by mouth daily as needed. For weight gain of 3 pounds or more.  30 tablet  1  . lansoprazole (PREVACID) 15 MG capsule Take 15 mg by mouth daily as needed (reflux).       . Multiple Vitamin (MULTIVITAMIN WITH MINERALS) TABS Take 1 tablet by mouth daily.      . Multiple Vitamins-Minerals (ICAPS) TABS Take 2 tablets by mouth 2 (two) times daily.       . nitrofurantoin (MACRODANTIN) 100 MG capsule Take 100 mg by mouth daily.       . nitroGLYCERIN (NITROSTAT) 0.4 MG SL tablet Place 1 tablet (0.4 mg total) under the tongue every 5 (five) minutes as needed for chest pain.  25 tablet  12  . Pitavastatin Calcium (LIVALO) 1 MG TABS Take 1 mg by mouth daily.      Marland Kitchen tiotropium (SPIRIVA) 18 MCG inhalation capsule Place 18 mcg into inhaler and inhale daily.      Marland Kitchen warfarin (COUMADIN) 2 MG tablet Take 2-4 mg by mouth See admin instructions. Take 4 mg every Tuesday,thurs & Saturday then 2 mg the rest of the days       No current facility-administered medications for this visit.    Allergies:    Allergies  Allergen Reactions  . Sulfa Antibiotics Other (See Comments)    [name]  . Celebrex [Celecoxib] Swelling  . Chlorthalidone Nausea Only    Lightheadedness, weakness, so "sick feeling"  . Clarithromycin Swelling  . Other     EXTENDED RELEASE tablets  . Statins Other (See Comments)    Hair fell out  . Vioxx [Rofecoxib] Other (See Comments)    Renal problems  . Penicillins Rash    Social History:  The patient  reports that she has never smoked. She has never used smokeless tobacco. She reports that she does not drink alcohol or use illicit drugs.   Family history:   Family History  Problem Relation Age of Onset  . Emphysema Mother   . Diabetes Father   . Heart attack Father 37  . Stroke Maternal Grandfather   . Heart disease Paternal Grandfather   . Heart disease Paternal Grandmother   . Cancer Son     testicular x3  . Pancreatitis Son   . Diabetes Son     Borderline    ROS:  Please see the history of present illness.  All other systems reviewed and negative.   PHYSICAL EXAM: VS:  BP 230/108  Pulse 63  Ht 5' 5.5" (1.664 m)  Wt 159 lb 3.2 oz (72.213 kg)  BMI 26.08 kg/m2 Well nourished, well developed, in no acute distress HEENT: Pupils are equal round react to light accommodation extraocular movements are intact.    Neck: no JVDNo cervical lymphadenopathy. Cardiac: Regular rate and rhythm without murmurs rubs or gallops. Lungs:  Rhonchi noted on the right. No rales or wheeze Abd: soft, nontender, positive bowel sounds all quadrants, no hepatosplenomegaly Ext: no lower extremity edema.  2+ radial and dorsalis pedis pulses. Skin: warm and dry Neuro:  Grossly normal  EKG:  Sinus rhythm first-degree AV block rate 63 beats per minute  ASSESSMENT AND PLAN:  Problem List Items Addressed This Visit   Chronic anticoagulation (Chronic)     On Coumadin    Chronic diastolic  heart failure     Patient appears euvolemic. She is no lower extremity edema. Continue Lasix as needed and continue to monitor weight daily. She seems pretty on on top of what she takes in for fluid in the daily basis.    High cholesterol (Chronic)     On a statin    Hypertension (Chronic)     Patient was severely hypertensive when she presented. She stated her blood pressure at home she checked this morning was 143/75. Gave her sublingual nitroglycerin in the clinic dissolves about halfway upon rechecking her blood pressure is 10/27/1970 in the left arm 110/64 in the right. Discontinue the rest of the nitroglycerin. She will recheck her blood pressure at home.  She usually takes an extra half Corag her blood pressure gets high.    PAF NSR on Flecanide - Primary (Chronic)     Draining sinus rhythm with first degree AV block    Relevant Orders      EKG 12-Lead

## 2014-07-05 NOTE — Assessment & Plan Note (Signed)
Patient was severely hypertensive when she presented. She stated her blood pressure at home she checked this morning was 143/75. Gave her sublingual nitroglycerin in the clinic dissolves about halfway upon rechecking her blood pressure is 10/27/1970 in the left arm 110/64 in the right. Discontinue the rest of the nitroglycerin. She will recheck her blood pressure at home.  She usually takes an extra half Corag her blood pressure gets high.

## 2014-07-05 NOTE — Assessment & Plan Note (Signed)
On a statin 

## 2014-07-05 NOTE — Assessment & Plan Note (Signed)
Draining sinus rhythm with first degree AV block

## 2014-07-05 NOTE — Assessment & Plan Note (Signed)
Patient appears euvolemic. She is no lower extremity edema. Continue Lasix as needed and continue to monitor weight daily. She seems pretty on on top of what she takes in for fluid in the daily basis.

## 2014-07-05 NOTE — Assessment & Plan Note (Signed)
On Coumadin 

## 2014-07-26 ENCOUNTER — Encounter: Payer: Self-pay | Admitting: Cardiovascular Disease

## 2014-07-26 ENCOUNTER — Ambulatory Visit (INDEPENDENT_AMBULATORY_CARE_PROVIDER_SITE_OTHER): Payer: Medicare Other | Admitting: Cardiovascular Disease

## 2014-07-26 VITALS — BP 135/74 | HR 66 | Ht 65.5 in | Wt 160.7 lb

## 2014-07-26 DIAGNOSIS — I5032 Chronic diastolic (congestive) heart failure: Secondary | ICD-10-CM

## 2014-07-26 DIAGNOSIS — E7800 Pure hypercholesterolemia, unspecified: Secondary | ICD-10-CM

## 2014-07-26 DIAGNOSIS — I10 Essential (primary) hypertension: Secondary | ICD-10-CM

## 2014-07-26 DIAGNOSIS — I480 Paroxysmal atrial fibrillation: Secondary | ICD-10-CM

## 2014-07-26 NOTE — Assessment & Plan Note (Signed)
Maintaining normal sinus rhythm on flecainide and Coumadin anticoagulation

## 2014-07-26 NOTE — Assessment & Plan Note (Signed)
On statin therapy, followed by her PCP 

## 2014-07-26 NOTE — Assessment & Plan Note (Signed)
Her hypertension is labile. She checks her blood pressure 3 times a day and it is above a certain level she takes when necessary clonidine. She is very well aware when her blood pressure is elevated or low. She's had normal renal Dopplers in the past.

## 2014-07-26 NOTE — Patient Instructions (Signed)
We request that you follow-up in: 6 months with Luke/Bryan and in 1 year with Dr San MorelleBerry  You will receive a reminder letter in the mail two months in advance. If you don't receive a letter, please call our office to schedule the follow-up appointment.

## 2014-07-26 NOTE — Assessment & Plan Note (Signed)
Recently admitted in August for diastolic heart failure. She is currently on low-dose Lasix. She denies chest pain or shortness of breath.

## 2014-07-26 NOTE — Progress Notes (Signed)
07/26/2014 Leana Roenita D Wegener   21-Jul-1930  [id number]  Primary Physician Martha ClanShaw, William, MD Primary Cardiologist: Runell GessJonathan J. Dekisha Mesmer MD Roseanne RenoFACP,FACC,FAHA, FSCAI    HPI:  The patient is a very pleasant 80 year old mildly overweight, widowed Caucasian female, mother of 2 sons, grandmother of 2 grandchildren, who I last saw in the office 07/09/13 She [place] and [place] ago. She has a history of atrial fibrillation with RVR in the past, on Coumadin anticoagulation and flecainide, maintaining sinus rhythm. Her other problems include hypertension, hyperlipidemia, and positive family history for heart disease with a father who died of an MI at age 78. She also has GERD. By echocardiogram, she has normal LV size and function with mild left atrial enlargement and normal Myoview. She has had no episodes of breakthrough PAF since her flecainide dose was adjusted.. She did break her hip back in January of 2013 and had hip replacement by Dr. Lajoyce CornersMatt Olin with [facility]. She also had several TIAs in the past , and had carotid Dopplers that were entirely normal.. She has been adjusting her blood pressure medicines because of symptomatic hypotension in the morning after taking her Coreg and hypertension later in the afternoon. She was admitted to the hospital with diastolic heart failure in August and was diuresed. She does have labile hypertension and was given when necessary clonidine by Dr. Eric Formoug Shaw.  Current Outpatient Prescriptions  Medication Sig Dispense Refill  . aspirin 81 MG chewable tablet Chew 1 tablet (81 mg total) by mouth daily.      Marland Kitchen. CALCIUM PO Take 1,800 mg by mouth daily.      . carvedilol (COREG) 12.5 MG tablet Take 0.5 tablet (6.25 mg total) by mouth every morning and take 1 tablet (12.5 mg total) by mouth every evening.      . cloNIDine (CATAPRES) 0.1 MG tablet Take 0.1 mg by mouth as needed. To be taken if BP over  180/90      . fexofenadine (ALLEGRA) 180 MG tablet Take 180 mg by mouth daily as needed for allergies.       . fish oil-omega-3 fatty acids 1000 MG capsule Take 1 g by mouth daily.      . flecainide (TAMBOCOR) 100 MG tablet Take 1 tablet (100 mg total) by mouth 2 (two) times daily.  180 tablet  2  . furosemide (LASIX) 20 MG tablet Take 1 tablet (20 mg total) by mouth daily as needed. For weight gain of 3 pounds or more.  30 tablet  1  . lansoprazole (PREVACID) 15 MG capsule Take 15 mg by mouth daily as needed (reflux).       . Multiple Vitamin (MULTIVITAMIN WITH MINERALS) TABS Take 1 tablet by mouth daily.      . Multiple Vitamins-Minerals (ICAPS) TABS Take 2 tablets by mouth 2 (two) times daily.      . nitrofurantoin (MACRODANTIN) 100 MG capsule Take 100 mg by mouth daily.       . nitroGLYCERIN (NITROSTAT) 0.4 MG SL tablet Place 1 tablet (0.4 mg total) under the tongue every 5 (five) minutes as needed for chest pain.  25 tablet  12  . Pitavastatin Calcium (LIVALO) 1 MG TABS Take 1 mg by mouth daily.      Marland Kitchen. tiotropium (SPIRIVA) 18 MCG inhalation capsule Place 18 mcg into inhaler and inhale daily.      Marland Kitchen. warfarin (COUMADIN) 2 MG tablet Take 2-4 mg  by mouth See admin instructions. Take 4 mg every Tuesday,thurs & Saturday then 2 mg the rest of the days       No current facility-administered medications for this visit.    Allergies  Allergen Reactions  . Sulfa Antibiotics Other (See Comments)    Kathlen BrunswickStevens Johnsons  . Celebrex [Celecoxib] Swelling  . Chlorthalidone Nausea Only    Lightheadedness, weakness, so "sick feeling"  . Clarithromycin Swelling  . Other     EXTENDED RELEASE tablets  . Statins Other (See Comments)    Hair fell out  . Vioxx [Rofecoxib] Other (See Comments)    Renal problems  . Penicillins Rash    History   Social History  . Marital Status: Widowed    Spouse Name: N/A    Number of Children: N/A  . Years of Education: N/A   Occupational History  . Not on file.     Social History Main Topics  . Smoking status: Never Smoker   . Smokeless tobacco: Never Used  . Alcohol Use: No  . Drug Use: No  . Sexual Activity: Not on file   Other Topics Concern  . Not on file   Social History Narrative  . No narrative on file     Review of Systems: General: negative for chills, fever, night sweats or weight changes.  Cardiovascular: negative for chest pain, dyspnea on exertion, edema, orthopnea, palpitations, paroxysmal nocturnal dyspnea or shortness of breath Dermatological: negative for rash Respiratory: negative for cough or wheezing Urologic: negative for hematuria Abdominal: negative for nausea, vomiting, diarrhea, bright red blood per rectum, melena, or hematemesis Neurologic: negative for visual changes, syncope, or dizziness All other systems reviewed and are otherwise negative except as noted above.    Blood pressure 135/74, pulse 66, height 5' 5.5" (1.664 m), weight 160 lb 11.2 oz (72.893 kg).  General appearance: alert and no distress Neck: no adenopathy, no JVD, supple, symmetrical, trachea midline, thyroid not enlarged, symmetric, no tenderness/mass/nodules and right carotid bruit Lungs: clear to auscultation bilaterally Heart: regular rate and rhythm, S1, S2 normal, no murmur, click, rub or gallop Extremities: extremities normal, atraumatic, no cyanosis or edema  EKG not performed today  ASSESSMENT AND PLAN:   PAF NSR on Flecanide Maintaining normal sinus rhythm on flecainide and Coumadin anticoagulation  TIA (transient ischemic attack) History of TIAs in the past with normal carotid Dopplers last checked in November 2013  Chronic diastolic heart failure Recently admitted in August for diastolic heart failure. She is currently on low-dose Lasix. She denies chest pain or shortness of breath.  Hypertension Her hypertension is labile. She checks her blood pressure 3 times a day and it is above a certain level she takes when  necessary clonidine. She is very well aware when her blood pressure is elevated or low. She's had normal renal Dopplers in the past.  High cholesterol On statin therapy, followed by her PCP.      Runell GessJonathan J. Diamone Whistler MD FACP,FACC,FAHA, [facility] 07/26/2014 9:57 AM

## 2014-07-26 NOTE — Assessment & Plan Note (Signed)
History of TIAs in the past with normal carotid Dopplers last checked in November 2013

## 2014-08-02 ENCOUNTER — Other Ambulatory Visit (HOSPITAL_COMMUNITY): Payer: Self-pay | Admitting: Internal Medicine

## 2014-08-02 ENCOUNTER — Other Ambulatory Visit: Payer: Self-pay | Admitting: Internal Medicine

## 2014-08-02 ENCOUNTER — Ambulatory Visit (HOSPITAL_COMMUNITY)
Admission: RE | Admit: 2014-08-02 | Discharge: 2014-08-02 | Disposition: A | Discharge status: Home / Self Care | Payer: Medicare Other | Source: Ambulatory Visit | Attending: Vascular Surgery | Admitting: Vascular Surgery

## 2014-08-02 DIAGNOSIS — R52 Pain, unspecified: Secondary | ICD-10-CM | POA: Insufficient documentation

## 2014-08-02 DIAGNOSIS — R2242 Localized swelling, mass and lump, left lower limb: Secondary | ICD-10-CM | POA: Insufficient documentation

## 2014-08-02 DIAGNOSIS — R229 Localized swelling, mass and lump, unspecified: Secondary | ICD-10-CM

## 2014-08-16 ENCOUNTER — Ambulatory Visit
Admission: RE | Admit: 2014-08-16 | Discharge: 2014-08-16 | Disposition: A | Discharge status: Home / Self Care | Payer: Medicare Other | Source: Ambulatory Visit | Attending: Internal Medicine | Admitting: Internal Medicine

## 2014-08-16 DIAGNOSIS — R229 Localized swelling, mass and lump, unspecified: Secondary | ICD-10-CM

## 2014-08-16 MED ORDER — IOHEXOL 300 MG/ML  SOLN
100.0000 mL | Freq: Once | INTRAMUSCULAR | Status: AC | PRN
  Administered 2014-08-16: 100 mL via INTRAVENOUS

## 2014-08-20 ENCOUNTER — Other Ambulatory Visit: Payer: Self-pay | Admitting: Internal Medicine

## 2014-08-20 DIAGNOSIS — IMO0002 Reserved for concepts with insufficient information to code with codable children: Secondary | ICD-10-CM

## 2014-08-20 DIAGNOSIS — R229 Localized swelling, mass and lump, unspecified: Principal | ICD-10-CM

## 2014-08-28 ENCOUNTER — Other Ambulatory Visit: Payer: Self-pay | Admitting: Internal Medicine

## 2014-08-28 DIAGNOSIS — R229 Localized swelling, mass and lump, unspecified: Secondary | ICD-10-CM

## 2014-09-02 ENCOUNTER — Inpatient Hospital Stay: Admission: RE | Admit: 2014-09-02 | Payer: Medicare Other | Source: Ambulatory Visit

## 2014-09-02 ENCOUNTER — Other Ambulatory Visit: Payer: Medicare Other

## 2014-09-10 ENCOUNTER — Other Ambulatory Visit: Payer: Medicare Other

## 2014-09-13 ENCOUNTER — Ambulatory Visit
Admission: RE | Admit: 2014-09-13 | Discharge: 2014-09-13 | Disposition: A | Discharge status: Home / Self Care | Payer: Medicare Other | Source: Ambulatory Visit | Attending: Internal Medicine | Admitting: Internal Medicine

## 2014-09-13 ENCOUNTER — Other Ambulatory Visit: Payer: Self-pay | Admitting: Internal Medicine

## 2014-09-13 DIAGNOSIS — R229 Localized swelling, mass and lump, unspecified: Secondary | ICD-10-CM

## 2014-10-01 ENCOUNTER — Encounter (HOSPITAL_COMMUNITY): Payer: Self-pay | Admitting: *Deleted

## 2014-10-01 ENCOUNTER — Emergency Department (HOSPITAL_COMMUNITY)
Admission: EM | Admit: 2014-10-01 | Discharge: 2014-10-01 | Disposition: A | Discharge status: Home / Self Care | Payer: Medicare Other | Attending: Emergency Medicine | Admitting: Emergency Medicine

## 2014-10-01 ENCOUNTER — Emergency Department (HOSPITAL_COMMUNITY): Payer: Medicare Other

## 2014-10-01 DIAGNOSIS — R5383 Other fatigue: Secondary | ICD-10-CM | POA: Diagnosis not present

## 2014-10-01 DIAGNOSIS — Z880 Allergy status to penicillin: Secondary | ICD-10-CM | POA: Insufficient documentation

## 2014-10-01 DIAGNOSIS — I480 Paroxysmal atrial fibrillation: Secondary | ICD-10-CM | POA: Insufficient documentation

## 2014-10-01 DIAGNOSIS — I10 Essential (primary) hypertension: Secondary | ICD-10-CM | POA: Diagnosis not present

## 2014-10-01 DIAGNOSIS — M1990 Unspecified osteoarthritis, unspecified site: Secondary | ICD-10-CM | POA: Diagnosis not present

## 2014-10-01 DIAGNOSIS — I5030 Unspecified diastolic (congestive) heart failure: Secondary | ICD-10-CM | POA: Diagnosis not present

## 2014-10-01 DIAGNOSIS — K219 Gastro-esophageal reflux disease without esophagitis: Secondary | ICD-10-CM | POA: Insufficient documentation

## 2014-10-01 DIAGNOSIS — H538 Other visual disturbances: Secondary | ICD-10-CM | POA: Diagnosis not present

## 2014-10-01 DIAGNOSIS — Z792 Long term (current) use of antibiotics: Secondary | ICD-10-CM | POA: Insufficient documentation

## 2014-10-01 DIAGNOSIS — R110 Nausea: Secondary | ICD-10-CM | POA: Diagnosis not present

## 2014-10-01 DIAGNOSIS — Z8673 Personal history of transient ischemic attack (TIA), and cerebral infarction without residual deficits: Secondary | ICD-10-CM | POA: Diagnosis not present

## 2014-10-01 DIAGNOSIS — Z8669 Personal history of other diseases of the nervous system and sense organs: Secondary | ICD-10-CM | POA: Insufficient documentation

## 2014-10-01 DIAGNOSIS — R0602 Shortness of breath: Secondary | ICD-10-CM

## 2014-10-01 DIAGNOSIS — Z79899 Other long term (current) drug therapy: Secondary | ICD-10-CM | POA: Insufficient documentation

## 2014-10-01 DIAGNOSIS — Z7901 Long term (current) use of anticoagulants: Secondary | ICD-10-CM | POA: Insufficient documentation

## 2014-10-01 DIAGNOSIS — E780 Pure hypercholesterolemia: Secondary | ICD-10-CM | POA: Insufficient documentation

## 2014-10-01 LAB — CBC
HCT: 37.4 % (ref 36.0–46.0)
Hemoglobin: 12.7 g/dL (ref 12.0–15.0)
MCH: 30 pg (ref 26.0–34.0)
MCHC: 34 g/dL (ref 30.0–36.0)
MCV: 88.2 fL (ref 78.0–100.0)
PLATELETS: 226 10*3/uL (ref 150–400)
RBC: 4.24 MIL/uL (ref 3.87–5.11)
RDW: 14.4 % (ref 11.5–15.5)
WBC: 7.1 10*3/uL (ref 4.0–10.5)

## 2014-10-01 LAB — COMPREHENSIVE METABOLIC PANEL
ALBUMIN: 3.9 g/dL (ref 3.5–5.2)
ALK PHOS: 97 U/L (ref 39–117)
ALT: 15 U/L (ref 0–35)
AST: 24 U/L (ref 0–37)
Anion gap: 8 (ref 5–15)
BUN: 18 mg/dL (ref 6–23)
CO2: 25 mmol/L (ref 19–32)
Calcium: 9.7 mg/dL (ref 8.4–10.5)
Chloride: 96 mEq/L (ref 96–112)
Creatinine, Ser: 1.07 mg/dL (ref 0.50–1.10)
GFR calc Af Amer: 54 mL/min — ABNORMAL LOW (ref >90)
GFR calc non Af Amer: 47 mL/min — ABNORMAL LOW (ref >90)
Glucose, Bld: 110 mg/dL — ABNORMAL HIGH (ref 70–99)
POTASSIUM: 4.5 mmol/L (ref 3.5–5.1)
Sodium: 129 mmol/L — ABNORMAL LOW (ref 135–145)
TOTAL PROTEIN: 7.6 g/dL (ref 6.0–8.3)
Total Bilirubin: 0.7 mg/dL (ref 0.3–1.2)

## 2014-10-01 LAB — PROTIME-INR
INR: 2.25 — ABNORMAL HIGH (ref 0.00–1.49)
Prothrombin Time: 25.1 seconds — ABNORMAL HIGH (ref 11.6–15.2)

## 2014-10-01 LAB — BRAIN NATRIURETIC PEPTIDE: B NATRIURETIC PEPTIDE 5: 470 pg/mL — AB (ref 0.0–100.0)

## 2014-10-01 MED ORDER — AMLODIPINE BESYLATE 5 MG PO TABS: 5.0000 mg | ORAL_TABLET | Freq: Every day | ORAL | Status: DC

## 2014-10-01 MED ORDER — AMLODIPINE BESYLATE 5 MG PO TABS
5.0000 mg | ORAL_TABLET | Freq: Once | ORAL | Status: AC
  Administered 2014-10-01: 5 mg via ORAL
  Filled 2014-10-01: qty 1

## 2014-10-01 NOTE — ED Notes (Signed)
Pt in xray

## 2014-10-01 NOTE — ED Provider Notes (Signed)
Patient seen/examined in the Emergency Department in conjunction with Resident Physician Provider  Patient reports elevated blood pressure at home.  Denies Ha/CP at this time Exam : awake/alert, no distress noted Plan: plan is to start new medications per nephrology    Joya Gaskinsonald W Hendy Brindle, MD 10/01/14 1651

## 2014-10-01 NOTE — ED Notes (Signed)
Patient states she does have vision changes but relates this to her bp.  She states it always affects her vision, high or low.

## 2014-10-01 NOTE — ED Notes (Signed)
Patient with elevated bp today 191/95, took clonidine at 12,  She recheck her bp and it was elevated over 200.  [facility] advised her to come to ED for further eval and treatment.  Patient states she feels tired.  She denies chest pain.  She has chronic sob with emphysema.  Patient is very hard of hearing.

## 2014-10-01 NOTE — ED Provider Notes (Signed)
CSN: [id number]     Arrival date & time 10/01/14  1434 History   First MD Initiated Contact with Patient 10/01/14 1548     Chief Complaint  Patient presents with  . Hypertension     (Consider location/radiation/quality/duration/timing/severity/associated sxs/prior Treatment) Patient is a 80 y.o. female presenting with hypertension. The history is provided by the patient and a relative (son).  Hypertension This is a recurrent problem. The current episode started today. The problem occurs constantly. The problem has been rapidly improving. Associated symptoms include fatigue, nausea and a visual change (feels like eyes are "wattery" no blurry vision). Pertinent negatives include no abdominal pain, arthralgias, chest pain, chills, coughing, diaphoresis, fever, headaches, myalgias, neck pain, rash, sore throat, vomiting or weakness. Treatments tried: coreg and clonadine. The treatment provided no relief.    Past Medical History  Diagnosis Date  . PAF (paroxysmal atrial fibrillation)     NSR on Flecanide  . Hypertension     labile  . High cholesterol   . Macular degeneration   . GERD (gastroesophageal reflux disease)   . TIA (transient ischemic attack)     several in past  . DJD (degenerative joint disease) 10/13    s/p rt THR  . Chronic anticoagulation   . Aortic valve stenosis, mild 11/13  . Ejection fraction   . Diastolic heart failure    Past Surgical History  Procedure Laterality Date  . Hip arthroplasty  06/30/2012    Procedure: ARTHROPLASTY BIPOLAR HIP;  Surgeon: Shelda Pal, MD;  Location: [facility];  Service: Orthopedics;  Laterality: Right;  right hip hemiarthroplasty  . Carotid duplex  03/20/2012    Bilateral Bulb/Proximal ICAs-mild plaque, mildly abnormal scan  . Cardiovascular stress test  10/13/2010    Normal study, no ECG changes  . Transthoracic echocardiogram  08/25/2012    EF 60-65%, moderately calcified leaflets of the aortic valve, LA moderately dilated,    Family History  Problem Relation Age of Onset  . Emphysema Mother   . Diabetes Father   . Heart attack Father 40  . Stroke Maternal Grandfather   . Heart disease Paternal Grandfather   . Heart disease Paternal Grandmother   . Cancer Son     testicular x3  . Pancreatitis Son   . Diabetes Son     Borderline   History  Substance Use Topics  . Smoking status: Never Smoker   . Smokeless tobacco: Never Used  . Alcohol Use: No   OB History    No data available     Review of Systems  Constitutional: Positive for fatigue. Negative for fever, chills, diaphoresis, activity change and appetite change.  HENT: Negative for facial swelling, rhinorrhea, sore throat, trouble swallowing and voice change.   Eyes: Positive for visual disturbance (feels like eyes are "wattery" no blurry vision). Negative for photophobia, pain, discharge, redness and itching.  Respiratory: Negative for cough, shortness of breath, wheezing and stridor.   Cardiovascular: Negative for chest pain, palpitations and leg swelling.  Gastrointestinal: Positive for nausea. Negative for vomiting, abdominal pain, constipation and anal bleeding.  Endocrine: Negative.   Genitourinary: Negative for dysuria, vaginal bleeding, vaginal discharge and vaginal pain.  Musculoskeletal: Negative for myalgias, back pain, arthralgias and neck pain.  Skin: Negative.  Negative for rash.  Allergic/Immunologic: Negative.   Neurological: Negative for dizziness, tremors, syncope, weakness and headaches.  Psychiatric/Behavioral: Negative for suicidal ideas, sleep disturbance and self-injury.  All other systems reviewed and are negative.     Allergies  Sulfa antibiotics; Celebrex; Chlorthalidone; Clarithromycin; Other; Statins; Vioxx; and Penicillins  Home Medications   Prior to Admission medications   Medication Sig Start Date End Date Taking? Authorizing Provider  amLODipine (NORVASC) 5 MG tablet Take 1 tablet (5 mg total) by mouth  daily. 10/01/14   Lula OlszewskiMike Bridget Westbrooks, MD  aspirin 81 MG chewable tablet Chew 1 tablet (81 mg total) by mouth daily. 09/01/12   Martha ClanWilliam Shaw, MD  CALCIUM PO Take 1,800 mg by mouth daily.    Historical Provider, MD  carvedilol (COREG) 12.5 MG tablet Take 0.5 tablet (6.25 mg total) by mouth every morning and take 1 tablet (12.5 mg total) by mouth every evening. 05/23/13   Abelino DerrickLuke K Kilroy, PA-C  cloNIDine (CATAPRES) 0.1 MG tablet Take 0.1 mg by mouth as needed. To be taken if BP over 180/90 07/08/14   Historical Provider, MD  fexofenadine (ALLEGRA) 180 MG tablet Take 180 mg by mouth daily as needed for allergies.     Historical Provider, MD  fish oil-omega-3 fatty acids 1000 MG capsule Take 1 g by mouth daily.    Historical Provider, MD  flecainide (TAMBOCOR) 100 MG tablet Take 1 tablet (100 mg total) by mouth 2 (two) times daily. 05/13/14   Runell GessJonathan J Berry, MD  furosemide (LASIX) 20 MG tablet Take 1 tablet (20 mg total) by mouth daily as needed. For weight gain of 3 pounds or more. 05/30/14   Sondra Bargesyan M Dunn, PA-C  lansoprazole (PREVACID) 15 MG capsule Take 15 mg by mouth daily as needed (reflux).     Historical Provider, MD  Multiple Vitamin (MULTIVITAMIN WITH MINERALS) TABS Take 1 tablet by mouth daily.    Historical Provider, MD  Multiple Vitamins-Minerals (ICAPS) TABS Take 2 tablets by mouth 2 (two) times daily.    Historical Provider, MD  nitrofurantoin (MACRODANTIN) 100 MG capsule Take 100 mg by mouth daily.  12/17/13   Historical Provider, MD  nitroGLYCERIN (NITROSTAT) 0.4 MG SL tablet Place 1 tablet (0.4 mg total) under the tongue every 5 (five) minutes as needed for chest pain. 05/30/14   Sondra Bargesyan M Dunn, PA-C  Pitavastatin Calcium (LIVALO) 1 MG TABS Take 1 mg by mouth daily.    Historical Provider, MD  tiotropium (SPIRIVA) 18 MCG inhalation capsule Place 18 mcg into inhaler and inhale daily.    Historical Provider, MD  warfarin (COUMADIN) 2 MG tablet Take 2-4 mg by mouth See admin instructions. Take 4 mg every  Tuesday,thurs & Saturday then 2 mg the rest of the days    Historical Provider, MD   BP 208/80 mmHg  Pulse 59  Temp(Src) 97.4 F (36.3 C) (Oral)  Resp 15  Ht 5' 5.5" (1.664 m)  Wt 155 lb (70.308 kg)  BMI 25.39 kg/m2  SpO2 99% Physical Exam  Constitutional: She is oriented to person, place, and time. She appears well-developed and well-nourished. No distress.  HENT:  Head: Normocephalic and atraumatic.  Right Ear: External ear normal.  Left Ear: External ear normal.  Mouth/Throat: Oropharynx is clear and moist. No oropharyngeal exudate.  Eyes: Conjunctivae and EOM are normal. Pupils are equal, round, and reactive to light. No scleral icterus.  With glasses vision 20/25 bilaterally. No papilledema appreciated on fundoscopic exam.   Neck: Normal range of motion. Neck supple. No JVD present. No tracheal deviation present. No thyromegaly present.  Cardiovascular: Normal rate, regular rhythm and intact distal pulses.  Exam reveals no gallop and no friction rub.   No murmur heard. Pulmonary/Chest: Effort normal and breath sounds  normal. No respiratory distress. She has no wheezes. She has no rales.  Abdominal: Soft. Bowel sounds are normal. She exhibits no distension. There is no tenderness.  Musculoskeletal: Normal range of motion. She exhibits no edema or tenderness.  Neurological: She is alert and oriented to person, place, and time. No cranial nerve deficit. She exhibits normal muscle tone. Coordination normal.  5/5 strength in all 4 extremities. Sensation intact in all extremities. Patient able to walk 20 feet with cane in ED without any issues.  Skin: Skin is warm and dry. She is not diaphoretic. No pallor.  Psychiatric: She has a normal mood and affect. She expresses no homicidal and no suicidal ideation. She expresses no suicidal plans and no homicidal plans.  Nursing note and vitals reviewed.   ED Course  Procedures (including critical care time) Labs Review Labs Reviewed   COMPREHENSIVE METABOLIC PANEL - Abnormal; Notable for the following:    Sodium 129 (*)    Glucose, Bld 110 (*)    GFR calc non Af Amer 47 (*)    GFR calc Af Amer 54 (*)    All other components within normal limits  PROTIME-INR - Abnormal; Notable for the following:    Prothrombin Time 25.1 (*)    INR 2.25 (*)    All other components within normal limits  BRAIN NATRIURETIC PEPTIDE - Abnormal; Notable for the following:    B Natriuretic Peptide 470.0 (*)    All other components within normal limits  CBC    Imaging Review Dg Chest 2 View  10/01/2014   CLINICAL DATA:  Shortness of breath.  Hypertension.  EXAM: CHEST  2 VIEW  COMPARISON:  05/27/2014 and 07/04/2012  FINDINGS: Heart size and pulmonary vascularity are normal and the lungs are clear. No effusions. No acute osseous abnormality. Thoracolumbar scoliosis and accentuation of the thoracic kyphosis. Multiple loose bodies in the left glenohumeral joint with severe arthritis of the joint.  IMPRESSION: No acute abnormalities.   Electronically Signed   By: Geanie CooleyJim  Maxwell M.D.   On: 10/01/2014 16:07     EKG Interpretation   Date/Time:  Tuesday October 01 2014 14:58:48 EST Ventricular Rate:  62 PR Interval:  230 QRS Duration: 106 QT Interval:  448 QTC Calculation: 454 R Axis:   33 Text Interpretation:  Sinus rhythm with 1st degree A-V block Incomplete  left bundle branch block Nonspecific ST abnormality Abnormal ECG No  significant change since last tracing Confirmed by Bebe ShaggyWICKLINE  MD, Dorinda HillNALD  [phone]) on 10/01/2014 4:31:57 PM      MDM   Final diagnoses:  Essential hypertension    The patient is a 80 y.o. F with HTN, Afib, and COPD who presents for hypertension with SBP of 200 at home and is noted to be 223/86 on arrival. No CP or worsening of baseline SOB. Neurological exam normal as above. EKG, chest xray, and labs unremarkable. No evidence of any end organ damage and do not suspect hypertensive emergency. Dr. Arrie Aranoladonato at  [facility] is called and the case is discussed with him. With start amlodipine 5mg  daily, increase clonidine from once daily to BID, and have patient follow up in clinic in 2 days. Patient and son express understanding and agreement with this plan and are discharged home with standard HTN return precautions.  Patient seen with attending, Dr. Bebe ShaggyWickline, who oversaw clinical decision making.     Lula OlszewskiMike Elexia Friedt, MD 10/01/14 1711  Joya Gaskinsonald W Wickline, MD 10/02/14 [phone]

## 2014-12-17 ENCOUNTER — Emergency Department (HOSPITAL_COMMUNITY): Payer: Medicare Other

## 2014-12-17 ENCOUNTER — Emergency Department (HOSPITAL_COMMUNITY)
Admission: EM | Admit: 2014-12-17 | Discharge: 2014-12-18 | Disposition: A | Discharge status: Home / Self Care | Payer: Medicare Other | Attending: Emergency Medicine | Admitting: Emergency Medicine

## 2014-12-17 ENCOUNTER — Encounter (HOSPITAL_COMMUNITY): Payer: Self-pay | Admitting: Emergency Medicine

## 2014-12-17 DIAGNOSIS — Z8673 Personal history of transient ischemic attack (TIA), and cerebral infarction without residual deficits: Secondary | ICD-10-CM | POA: Diagnosis not present

## 2014-12-17 DIAGNOSIS — M1990 Unspecified osteoarthritis, unspecified site: Secondary | ICD-10-CM | POA: Diagnosis not present

## 2014-12-17 DIAGNOSIS — S59911A Unspecified injury of right forearm, initial encounter: Secondary | ICD-10-CM | POA: Diagnosis not present

## 2014-12-17 DIAGNOSIS — E780 Pure hypercholesterolemia: Secondary | ICD-10-CM | POA: Insufficient documentation

## 2014-12-17 DIAGNOSIS — R42 Dizziness and giddiness: Secondary | ICD-10-CM | POA: Insufficient documentation

## 2014-12-17 DIAGNOSIS — Y999 Unspecified external cause status: Secondary | ICD-10-CM | POA: Diagnosis not present

## 2014-12-17 DIAGNOSIS — I10 Essential (primary) hypertension: Secondary | ICD-10-CM | POA: Insufficient documentation

## 2014-12-17 DIAGNOSIS — Z7982 Long term (current) use of aspirin: Secondary | ICD-10-CM | POA: Insufficient documentation

## 2014-12-17 DIAGNOSIS — Z7901 Long term (current) use of anticoagulants: Secondary | ICD-10-CM | POA: Insufficient documentation

## 2014-12-17 DIAGNOSIS — Y929 Unspecified place or not applicable: Secondary | ICD-10-CM | POA: Insufficient documentation

## 2014-12-17 DIAGNOSIS — H9190 Unspecified hearing loss, unspecified ear: Secondary | ICD-10-CM | POA: Insufficient documentation

## 2014-12-17 DIAGNOSIS — I951 Orthostatic hypotension: Secondary | ICD-10-CM | POA: Diagnosis not present

## 2014-12-17 DIAGNOSIS — R55 Syncope and collapse: Secondary | ICD-10-CM | POA: Diagnosis not present

## 2014-12-17 DIAGNOSIS — I480 Paroxysmal atrial fibrillation: Secondary | ICD-10-CM | POA: Insufficient documentation

## 2014-12-17 DIAGNOSIS — S0990XA Unspecified injury of head, initial encounter: Secondary | ICD-10-CM | POA: Diagnosis present

## 2014-12-17 DIAGNOSIS — Z880 Allergy status to penicillin: Secondary | ICD-10-CM | POA: Insufficient documentation

## 2014-12-17 DIAGNOSIS — I5030 Unspecified diastolic (congestive) heart failure: Secondary | ICD-10-CM | POA: Diagnosis not present

## 2014-12-17 DIAGNOSIS — Z79899 Other long term (current) drug therapy: Secondary | ICD-10-CM | POA: Diagnosis not present

## 2014-12-17 DIAGNOSIS — Y939 Activity, unspecified: Secondary | ICD-10-CM | POA: Insufficient documentation

## 2014-12-17 DIAGNOSIS — S6991XA Unspecified injury of right wrist, hand and finger(s), initial encounter: Secondary | ICD-10-CM | POA: Diagnosis not present

## 2014-12-17 DIAGNOSIS — W19XXXA Unspecified fall, initial encounter: Secondary | ICD-10-CM | POA: Insufficient documentation

## 2014-12-17 DIAGNOSIS — S00512A Abrasion of oral cavity, initial encounter: Secondary | ICD-10-CM | POA: Insufficient documentation

## 2014-12-17 DIAGNOSIS — K219 Gastro-esophageal reflux disease without esophagitis: Secondary | ICD-10-CM | POA: Diagnosis not present

## 2014-12-17 DIAGNOSIS — S4991XA Unspecified injury of right shoulder and upper arm, initial encounter: Secondary | ICD-10-CM | POA: Insufficient documentation

## 2014-12-17 DIAGNOSIS — S0093XA Contusion of unspecified part of head, initial encounter: Secondary | ICD-10-CM | POA: Diagnosis not present

## 2014-12-17 LAB — BASIC METABOLIC PANEL
ANION GAP: 7 (ref 5–15)
BUN: 23 mg/dL (ref 6–23)
CALCIUM: 9.7 mg/dL (ref 8.4–10.5)
CO2: 27 mmol/L (ref 19–32)
Chloride: 91 mmol/L — ABNORMAL LOW (ref 96–112)
Creatinine, Ser: 1.14 mg/dL — ABNORMAL HIGH (ref 0.50–1.10)
GFR calc Af Amer: 50 mL/min — ABNORMAL LOW (ref >90)
GFR, EST NON AFRICAN AMERICAN: 44 mL/min — AB (ref >90)
Glucose, Bld: 134 mg/dL — ABNORMAL HIGH (ref 70–99)
Potassium: 4 mmol/L (ref 3.5–5.1)
SODIUM: 125 mmol/L — AB (ref 135–145)

## 2014-12-17 LAB — CBC
HCT: 32.4 % — ABNORMAL LOW (ref 36.0–46.0)
Hemoglobin: 10.9 g/dL — ABNORMAL LOW (ref 12.0–15.0)
MCH: 29.5 pg (ref 26.0–34.0)
MCHC: 33.6 g/dL (ref 30.0–36.0)
MCV: 87.8 fL (ref 78.0–100.0)
PLATELETS: 221 10*3/uL (ref 150–400)
RBC: 3.69 MIL/uL — AB (ref 3.87–5.11)
RDW: 14.2 % (ref 11.5–15.5)
WBC: 9.8 10*3/uL (ref 4.0–10.5)

## 2014-12-17 LAB — HEPATIC FUNCTION PANEL
ALT: 15 U/L (ref 0–35)
AST: 26 U/L (ref 0–37)
Albumin: 3.5 g/dL (ref 3.5–5.2)
Alkaline Phosphatase: 87 U/L (ref 39–117)
BILIRUBIN DIRECT: 0.2 mg/dL (ref 0.0–0.5)
BILIRUBIN TOTAL: 1.1 mg/dL (ref 0.3–1.2)
Indirect Bilirubin: 0.9 mg/dL (ref 0.3–0.9)
Total Protein: 7 g/dL (ref 6.0–8.3)

## 2014-12-17 LAB — PROTIME-INR
INR: 3.82 — AB (ref 0.00–1.49)
PROTHROMBIN TIME: 37.9 s — AB (ref 11.6–15.2)

## 2014-12-17 MED ORDER — ONDANSETRON HCL 4 MG/2ML IJ SOLN
4.0000 mg | Freq: Once | INTRAMUSCULAR | Status: AC
Start: 2014-12-17 — End: 2014-12-17
  Administered 2014-12-17: 4 mg via INTRAVENOUS
  Filled 2014-12-17: qty 2

## 2014-12-17 MED ORDER — SODIUM CHLORIDE 0.9 % IV BOLUS (SEPSIS)
500.0000 mL | Freq: Once | INTRAVENOUS | Status: AC
  Administered 2014-12-18: 500 mL via INTRAVENOUS

## 2014-12-17 MED ORDER — FENTANYL CITRATE 0.05 MG/ML IJ SOLN
50.0000 ug | Freq: Once | INTRAMUSCULAR | Status: AC
  Administered 2014-12-17: 50 ug via INTRAVENOUS
  Filled 2014-12-17: qty 2

## 2014-12-17 MED ORDER — SODIUM CHLORIDE 0.9 % IV SOLN
INTRAVENOUS | Status: DC
  Administered 2014-12-18: 01:00:00 via INTRAVENOUS

## 2014-12-17 NOTE — ED Notes (Addendum)
Pt presents to ED via EMS for fall. Pt states she felt dizzy and lost her balance and fell. Deformity noted right shoulder and hand. Hematoma noted at right eye. Laceration at right hands. Pt c/o right hip pain and lower back pain. Pt alerts and oriented x4 at this time. Per EMS, [id number]-[id number].

## 2014-12-17 NOTE — ED Provider Notes (Signed)
CSN: [id number]     Arrival date & time 12/17/14  2125 History   First MD Initiated Contact with Patient 12/17/14 2133     Chief Complaint  Patient presents with  . Fall  . Near Syncope     (Consider location/radiation/quality/duration/timing/severity/associated sxs/prior Treatment) HPI   PCP: Martha Clan, MD Blood pressure 158/56, pulse 58, temperature 97.5 F (36.4 C), temperature source Oral, resp. rate 13, SpO2 96 %.  Madeline Serrano is a 81 y.o.female with a significant PMH of proximal atrial fibrillation for which she takes Coumadin, hypertension, GERD, TIA, orthostatic syncope presents to the ER with complaints of syncopal episode with fall after getting dizzy with change of position. Patient is on Coumadin for her age of fibrillation. When she fell she landed on her right side and has pain to her right shoulder, right forearm, right hand, coccyx/sacrum, the patient complains of dry mouth. He also hit the right side of her head and has a significant contusion to her right orbit. She believes she had LOC. She was brought in by EMS. The patient is hard of hearing and therefore much history is given by her son. Obtaining HPI is difficult.   Negative Review of Symptoms: ataxia, dyphagia, bowel or urine incontinence, fevers, confusion, focal weakness, change in vision, intraoral injury, back pain  Past Medical History  Diagnosis Date  . PAF (paroxysmal atrial fibrillation)     NSR on Flecanide  . Hypertension     labile  . High cholesterol   . Macular degeneration   . GERD (gastroesophageal reflux disease)   . TIA (transient ischemic attack)     several in past  . DJD (degenerative joint disease) 10/13    s/p rt THR  . Chronic anticoagulation   . Aortic valve stenosis, mild 11/13  . Ejection fraction   . Diastolic heart failure    Past Surgical History  Procedure Laterality Date  . Hip arthroplasty  06/30/2012    Procedure: ARTHROPLASTY BIPOLAR HIP;  Surgeon: Shelda Pal, MD;  Location: [facility] OR;  Service: Orthopedics;  Laterality: Right;  right hip hemiarthroplasty  . Carotid duplex  03/20/2012    Bilateral Bulb/Proximal ICAs-mild plaque, mildly abnormal scan  . Cardiovascular stress test  10/13/2010    Normal study, no ECG changes  . Transthoracic echocardiogram  08/25/2012    EF 60-65%, moderately calcified leaflets of the aortic valve, LA moderately dilated,   Family History  Problem Relation Age of Onset  . Emphysema Mother   . Diabetes Father   . Heart attack Father 39  . Stroke Maternal Grandfather   . Heart disease Paternal Grandfather   . Heart disease Paternal Grandmother   . Cancer Son     testicular x3  . Pancreatitis Son   . Diabetes Son     Borderline   History  Substance Use Topics  . Smoking status: Never Smoker   . Smokeless tobacco: Never Used  . Alcohol Use: No   OB History    No data available     Review of Systems 10 Systems reviewed and are negative for acute change except as noted in the HPI.    Allergies  Sulfa antibiotics; Celebrex; Chlorthalidone; Clarithromycin; Other; Statins; Vioxx; and Penicillins  Home Medications   Prior to Admission medications   Medication Sig Start Date End Date Taking? Authorizing Provider  amLODipine (NORVASC) 5 MG tablet Take 1 tablet (5 mg total) by mouth daily. Patient not taking: Reported on 12/17/2014 10/01/14  Lula Olszewski, MD  aspirin 81 MG chewable tablet Chew 1 tablet (81 mg total) by mouth daily. 09/01/12  Yes Martha Clan, MD  CALCIUM PO Take 1 tablet by mouth 3 (three) times daily.    Yes Historical Provider, MD  carvedilol (COREG) 12.5 MG tablet Take 12.5 mg by mouth 2 (two) times daily with a meal.  05/23/13  Yes Abelino Derrick, PA-C  cloNIDine (CATAPRES) 0.1 MG tablet Take 0.1-0.2 mg by mouth 2 (two) times daily. Take 2 tablets at noon then take 1 tablet every evening 07/08/14  Yes Historical Provider, MD  fexofenadine (ALLEGRA) 180 MG tablet Take 180 mg by mouth daily  as needed for allergies.    Yes Historical Provider, MD  fish oil-omega-3 fatty acids 1000 MG capsule Take 1 g by mouth daily.   Yes Historical Provider, MD  flecainide (TAMBOCOR) 100 MG tablet Take 1 tablet (100 mg total) by mouth 2 (two) times daily. 05/13/14  Yes Runell Gess, MD  furosemide (LASIX) 20 MG tablet Take 1 tablet (20 mg total) by mouth daily as needed. For weight gain of 3 pounds or more. 05/30/14  Yes Ryan M Dunn, PA-C  lansoprazole (PREVACID) 15 MG capsule Take 15 mg by mouth daily as needed (reflux).    Yes Historical Provider, MD  Multiple Vitamin (MULTIVITAMIN WITH MINERALS) TABS Take 1 tablet by mouth daily.   Yes Historical Provider, MD  Multiple Vitamins-Minerals (ICAPS) TABS Take 2 tablets by mouth 2 (two) times daily.   Yes Historical Provider, MD  nitrofurantoin (MACRODANTIN) 100 MG capsule Take 100 mg by mouth daily.  12/17/13  Yes Historical Provider, MD  nitroGLYCERIN (NITROSTAT) 0.4 MG SL tablet Place 1 tablet (0.4 mg total) under the tongue every 5 (five) minutes as needed for chest pain. 05/30/14  Yes Ryan M Dunn, PA-C  Pitavastatin Calcium (LIVALO) 1 MG TABS Take 0.5 mg by mouth daily.    Yes Historical Provider, MD  tiotropium (SPIRIVA) 18 MCG inhalation capsule Place 18 mcg into inhaler and inhale daily.   Yes Historical Provider, MD  warfarin (COUMADIN) 2 MG tablet Take 2-4 mg by mouth See admin instructions. Take 4 mg on Monday then 2 mg the rest of the days   Yes Historical Provider, MD   BP 158/56 mmHg  Pulse 58  Temp(Src) 97.5 F (36.4 C) (Oral)  Resp 13  SpO2 96% Physical Exam  Constitutional: She is oriented to person, place, and time. She appears well-developed and well-nourished. No distress.  HENT:  Head: Normocephalic. Head is with abrasion, with contusion and with right periorbital erythema. Head is without raccoon's eyes, without Battle's sign and without laceration. Hair is normal.  Right Ear: Tympanic membrane and ear canal normal.  Left Ear:  Tympanic membrane and ear canal normal.  Nose: Nose normal. Right sinus exhibits no maxillary sinus tenderness and no frontal sinus tenderness. Left sinus exhibits no maxillary sinus tenderness and no frontal sinus tenderness.  Small abrasion to tongue. No active bleeding. Small amount of dried blood present  Eyes: Conjunctivae and EOM are normal. Pupils are equal, round, and reactive to light.  Neck: Normal range of motion. Neck supple. No spinous process tenderness and no muscular tenderness present.  Cardiovascular: Normal rate and regular rhythm.   Pulmonary/Chest: Effort normal and breath sounds normal. She exhibits no tenderness, no crepitus and no retraction.  Abdominal: Soft. There is no tenderness. There is no rigidity, no rebound and no guarding.  Musculoskeletal:  Arms: The patient has multiple contusions. Is notably to her right shoulder with mildly decreased range of motion. She has contusion to right forearm and 2 posterior right hand. Intact radial pulses. She has a small skin tear to her right forearm there is no longer actively bleeding and is already had wound care provided. The patient has some mild bruising and tenderness to her low back sacral region. She's got physiologic and symmetrical strength to bilateral lower extremities with intact sensations. She has no hip pain with range of motion passive or with resistance.  Neurological: She is alert and oriented to person, place, and time.  Skin: Skin is warm and dry.  Nursing note and vitals reviewed.   ED Course  Procedures (including critical care time) Labs Review Labs Reviewed  CBC - Abnormal; Notable for the following:    RBC 3.69 (*)    Hemoglobin 10.9 (*)    HCT 32.4 (*)    All other components within normal limits  BASIC METABOLIC PANEL - Abnormal; Notable for the following:    Sodium 125 (*)    Chloride 91 (*)    Glucose, Bld 134 (*)    Creatinine, Ser 1.14 (*)    GFR calc non Af Amer 44 (*)    GFR calc  Af Amer 50 (*)    All other components within normal limits  PROTIME-INR - Abnormal; Notable for the following:    Prothrombin Time 37.9 (*)    INR 3.82 (*)    All other components within normal limits  HEPATIC FUNCTION PANEL  CBG MONITORING, ED    Imaging Review Dg Chest 2 View  12/18/2014   CLINICAL DATA:  Status post fall onto right side. Near syncope. Concern for chest injury. Initial encounter.  EXAM: CHEST  2 VIEW  COMPARISON:  Chest radiograph performed 10/01/2014  FINDINGS: The lungs are well-aerated. Mild right mid lung opacity is of uncertain significance. There is no evidence of pleural effusion or pneumothorax.  The heart is normal in size; the mediastinal contour is within normal limits. No acute osseous abnormalities are seen. There is chronic degenerative change at the left glenohumeral joint, with joint space loss and sclerotic change. There is some degree of bony remodeling at the left humeral head.  IMPRESSION: Mild right midlung airspace opacity is of uncertain significance and could reflect mild infection. Would correlate with the patient's symptoms. Further evaluation could be considered as deemed clinically appropriate, on an elective nonemergent basis. No displaced rib fracture seen.   Electronically Signed   By: Roanna Raider M.D.   On: 12/18/2014 01:17   Dg Sacrum/coccyx  12/18/2014   CLINICAL DATA:  Status post fall onto right side, with sacrococcygeal pain. Initial encounter.  EXAM: SACRUM AND COCCYX - 2+ VIEW  COMPARISON:  Pelvis radiograph performed 06/30/2012  FINDINGS: There is no evidence of fracture or dislocation. The sacrum and coccyx appear intact. The sacroiliac joints demonstrate mild sclerosis but are otherwise unremarkable. The lower lumbar spine is difficult to fully assess; there may be mild chronic loss of height at L5, though this is not characterized on the frontal view and may be artifactual. A right hip hemiarthroplasty is grossly unremarkable in  appearance, though incompletely imaged.  The visualized bowel gas pattern is grossly unremarkable in appearance.  IMPRESSION: No evidence of acute fracture or dislocation.   Electronically Signed   By: Roanna Raider M.D.   On: 12/18/2014 01:09   Dg Shoulder Right  12/18/2014   CLINICAL DATA:  Status post fall onto right side, with superior shoulder bruising. Initial encounter.  EXAM: RIGHT SHOULDER - 2+ VIEW  COMPARISON:  Chest radiograph performed 10/01/2014  FINDINGS: There is no evidence of fracture or dislocation. The right humeral head is seated within the glenoid fossa. The acromioclavicular joint is unremarkable in appearance. No significant soft tissue abnormalities are seen. The visualized portions of the right lung are clear.  IMPRESSION: No evidence of fracture or dislocation.   Electronically Signed   By: Roanna RaiderJeffery  Chang M.D.   On: 12/18/2014 01:06   Dg Forearm Right  12/18/2014   CLINICAL DATA:  Status post fall onto right side, with right forearm pain and bruising. Initial encounter.  EXAM: RIGHT FOREARM - 2 VIEW  COMPARISON:  None.  FINDINGS: There is no evidence of fracture or dislocation. The radius and ulna appear intact. Soft tissue swelling is noted about the proximal forearm. The elbow joint is incompletely assessed, but appears grossly unremarkable. The carpal rows appear grossly intact, and demonstrate normal alignment. Dorsal soft swelling is noted at the wrist.  IMPRESSION: No evidence of fracture or dislocation.   Electronically Signed   By: Roanna RaiderJeffery  Chang M.D.   On: 12/18/2014 01:14   Ct Head Wo Contrast  12/18/2014   CLINICAL DATA:  Larey SeatFell at home  EXAM: CT HEAD WITHOUT CONTRAST  CT CERVICAL SPINE WITHOUT CONTRAST  TECHNIQUE: Multidetector CT imaging of the head and cervical spine was performed following the standard protocol without intravenous contrast. Multiplanar CT image reconstructions of the cervical spine were also generated.  COMPARISON:  None.  FINDINGS: CT HEAD FINDINGS   There is no intracranial hemorrhage, mass or evidence of acute infarction. There is moderately severe generalized atrophy. There is mild chronic microvascular ischemic change. There is no significant extra-axial fluid collection.  No acute intracranial findings are evident. The calvarium and skullbase are intact. There is chronic left maxillary sinus opacification with associated bony sclerosis and thickening.  CT CERVICAL SPINE FINDINGS  The vertebral column, pedicles and facet articulations are intact. There is no evidence of acute fracture. No acute soft tissue abnormalities are evident.  Moderately severe degenerative disc changes are present from C3 through C7 with posterior osteophytes.  IMPRESSION: 1. Negative for acute intracranial traumatic injury 2. Negative for acute cervical spine fracture 3. Moderate generalized atrophy and chronic microvascular ischemia 4. Cervical degenerative disc changes   Electronically Signed   By: Ellery Plunkaniel R Mitchell M.D.   On: 12/18/2014 01:20   Ct Cervical Spine Wo Contrast  12/18/2014   CLINICAL DATA:  Larey SeatFell at home  EXAM: CT HEAD WITHOUT CONTRAST  CT CERVICAL SPINE WITHOUT CONTRAST  TECHNIQUE: Multidetector CT imaging of the head and cervical spine was performed following the standard protocol without intravenous contrast. Multiplanar CT image reconstructions of the cervical spine were also generated.  COMPARISON:  None.  FINDINGS: CT HEAD FINDINGS  There is no intracranial hemorrhage, mass or evidence of acute infarction. There is moderately severe generalized atrophy. There is mild chronic microvascular ischemic change. There is no significant extra-axial fluid collection.  No acute intracranial findings are evident. The calvarium and skullbase are intact. There is chronic left maxillary sinus opacification with associated bony sclerosis and thickening.  CT CERVICAL SPINE FINDINGS  The vertebral column, pedicles and facet articulations are intact. There is no evidence of  acute fracture. No acute soft tissue abnormalities are evident.  Moderately severe degenerative disc changes are present from C3 through C7 with posterior osteophytes.  IMPRESSION: 1. Negative for  acute intracranial traumatic injury 2. Negative for acute cervical spine fracture 3. Moderate generalized atrophy and chronic microvascular ischemia 4. Cervical degenerative disc changes   Electronically Signed   By: Ellery Plunk M.D.   On: 12/18/2014 01:20   Dg Hand Complete Right  12/18/2014   CLINICAL DATA:  Status post fall onto right side, with right hand bruising and dorsal hematoma. Initial encounter.  EXAM: RIGHT HAND - COMPLETE 3+ VIEW  COMPARISON:  None.  FINDINGS: There is no evidence of fracture or dislocation. Mild joint space narrowing is noted at the first carpometacarpal joint, with sclerosis, likely reflecting mild osteoarthritis. There is calcification at the triangular fibrocartilage.  The patient is status post amputation at the distal second and third digits, with chronic osseous fusion at the proximal and middle phalanges of the second, third and fourth digits. Degenerative joint space narrowing is noted at the fourth distal interphalangeal joint and the interphalangeal joints of the fifth digit.  The carpal rows are otherwise intact, and demonstrate normal alignment. Soft tissue swelling is noted dorsal to the wrist.  IMPRESSION: 1. No evidence of acute fracture or dislocation. 2. Postoperative changes at the second, third and fourth digits. Underlying osteoarthritis noted. Calcification at the triangular fibrocartilage.   Electronically Signed   By: Roanna Raider M.D.   On: 12/18/2014 01:11     EKG Interpretation   Date/Time:  Tuesday December 17 2014 21:55:07 EDT Ventricular Rate:  62 PR Interval:  239 QRS Duration: 122 QT Interval:  452 QTC Calculation: 459 R Axis:   76 Text Interpretation:  Sinus rhythm with 1st degree A-V block Left bundle  branch block When compared with ECG  of 05/27/2014 No significant change was  found Confirmed by [facility]  MD, Nicholos Johns [phone]) on 12/17/2014 11:01:13 PM      MDM   Final diagnoses:  Fall    Fortunately pt has no acute findings on xray or ct scans. My attending and myself feel that she needs admission for observation over night due to syncope and hyponatremia. Discussed case with hospitalist - he will evaluate patient for admission, reports hyponatremia is chronic and her results are not off from baseline. At end of shift- Sabino Dick, NP will be contact regarding if patient will be dispo or will be admitted.   Pt will need to be ambulated before she can be sent home.  Filed Vitals:   12/18/14 0017  BP: 158/56  Pulse: 58  Temp:   Resp: [id number], PA-C 12/18/14 0158  Samuel Jester, DO 12/20/14 1256

## 2014-12-18 DIAGNOSIS — I951 Orthostatic hypotension: Secondary | ICD-10-CM

## 2014-12-18 DIAGNOSIS — W19XXXA Unspecified fall, initial encounter: Secondary | ICD-10-CM | POA: Diagnosis present

## 2014-12-18 DIAGNOSIS — S0093XA Contusion of unspecified part of head, initial encounter: Secondary | ICD-10-CM | POA: Diagnosis not present

## 2014-12-18 NOTE — ED Provider Notes (Signed)
Evaluated by Hospitalist and after discussion with patient and son to be discharged home and follow up with PCP as scheduled later today   Madeline FavorGail Sophina Mitten, NP 12/18/14 0240  Samuel JesterKathleen McManus, DO 12/20/14 1256

## 2014-12-18 NOTE — Consult Note (Signed)
[facility] Medical Consultation  KIMBERLYN QUIOCHO ZOX:[id number] DOB: [birth year] DOA: 12/17/2014 PCP: Martha Clan, MD   Requesting physician: EDP Date of consultation: 12/18/14 Reason for consultation: Near syncope, fall  Impression/Recommendations Active Problems:   Orthostasis   Fall    1. Fall - patient is thankfully without major injury in fall, just some bruises as demonstrated by numerous imaging studies of the injured areas.  No broken bones, no ICH. 2. Orthostasis - Patient with ongoing and extensive trouble with orthostasis and very labile blood pressures which tend to drop significantly with standing.  This has been an ongoing problem for several months at least.  It sounds like she has had extensive work up on this and has 2 physicians managing her BP meds as an outpatient. 1. At this point, I think that having yet one more physician attempt to treat this long term ongoing problem, would not necessarily be of any benefit to this patient who is already scheduled to see her nephrologist tomorrow anyhow for med adjustment. 2. Difficult situation with trouble with both hyper tension and orthostatic hypotension that is being extensively managed as outpatient. 3. Admitting patient to hospital might prevent a fall tonight, but it seems unlikely that we will resolve her long term orthostasis problems during an admission when 2 physicians have been unable to with changes to med regimen as outpatient for past 6 months now and running. 4. Son and patient understand she remains at high risk for syncope and fall at this time, but even if we admitted her, she would likely be at just as high risk at time of discharge. 5. Son and patient after understanding this, have elected to go home and make the appointment with her nephrologist tomorrow who is much more familiar with patients history and ongoing treatment. 3. Hyponatremia - very much chronic (has been present on all BMPs since 2011), her  hyponatremia of 125 today is slightly below her baseline of 127-129.  She is on a sodium restricted diet at this point due to her HTN. 1. Given chronicity, while this increases fall risk, doubt that this is responsible solely for todays symptoms. 2. More likely her orthostasis is at fault.    Chief Complaint: Lightheadedness, fall  HPI:  81 yo F with extensive history of labile HTN, orthostasis, currently being managed by her PCP and a nephrologist (with whom she has an appointment tomorrow).  Patient presents after an episode of near syncope that occurred after standing and walking to her front door and fall associated with this.  Patient stood up, went to front door, became lightheaded, and collapsed.  She states she did not have LOC associated with this.  Review of Systems:  Frequently has similar symptoms of orthostasis and BP drop after standing that always improves with sitting or lying down.  12 systems reviewed and otherwise negative.  Past Medical History  Diagnosis Date  . PAF (paroxysmal atrial fibrillation)     NSR on Flecanide  . Hypertension     labile  . High cholesterol   . Macular degeneration   . GERD (gastroesophageal reflux disease)   . TIA (transient ischemic attack)     several in past  . DJD (degenerative joint disease) 10/13    s/p rt THR  . Chronic anticoagulation   . Aortic valve stenosis, mild 11/13  . Ejection fraction   . Diastolic heart failure    Past Surgical History  Procedure Laterality Date  . Hip arthroplasty  06/30/2012  Procedure: ARTHROPLASTY BIPOLAR HIP;  Surgeon: Shelda Pal, MD;  Location: [facility] OR;  Service: Orthopedics;  Laterality: Right;  right hip hemiarthroplasty  . Carotid duplex  03/20/2012    Bilateral Bulb/Proximal ICAs-mild plaque, mildly abnormal scan  . Cardiovascular stress test  10/13/2010    Normal study, no ECG changes  . Transthoracic echocardiogram  08/25/2012    EF 60-65%, moderately calcified leaflets of the aortic  valve, LA moderately dilated,   Social History:  reports that she has never smoked. She has never used smokeless tobacco. She reports that she does not drink alcohol or use illicit drugs.  Allergies  Allergen Reactions  . Sulfa Antibiotics Other (See Comments)    [name]  . Celebrex [Celecoxib] Swelling  . Chlorthalidone Nausea Only    Lightheadedness, weakness, so "sick feeling"  . Clarithromycin Swelling  . Other     EXTENDED RELEASE tablets  . Statins Other (See Comments)    Hair fell out  . Vioxx [Rofecoxib] Other (See Comments)    Renal problems  . Penicillins Rash   Family History  Problem Relation Age of Onset  . Emphysema Mother   . Diabetes Father   . Heart attack Father 74  . Stroke Maternal Grandfather   . Heart disease Paternal Grandfather   . Heart disease Paternal Grandmother   . Cancer Son     testicular x3  . Pancreatitis Son   . Diabetes Son     Borderline    Prior to Admission medications   Medication Sig Start Date End Date Taking? Authorizing Provider  amLODipine (NORVASC) 5 MG tablet Take 1 tablet (5 mg total) by mouth daily. Patient not taking: Reported on 12/17/2014 10/01/14   Lula Olszewski, MD  aspirin 81 MG chewable tablet Chew 1 tablet (81 mg total) by mouth daily. 09/01/12  Yes Martha Clan, MD  CALCIUM PO Take 1 tablet by mouth 3 (three) times daily.    Yes Historical Provider, MD  carvedilol (COREG) 12.5 MG tablet Take 12.5 mg by mouth 2 (two) times daily with a meal.  05/23/13  Yes Abelino Derrick, PA-C  cloNIDine (CATAPRES) 0.1 MG tablet Take 0.1-0.2 mg by mouth 2 (two) times daily. Take 2 tablets at noon then take 1 tablet every evening 07/08/14  Yes Historical Provider, MD  fexofenadine (ALLEGRA) 180 MG tablet Take 180 mg by mouth daily as needed for allergies.    Yes Historical Provider, MD  fish oil-omega-3 fatty acids 1000 MG capsule Take 1 g by mouth daily.   Yes Historical Provider, MD  flecainide (TAMBOCOR) 100 MG tablet Take 1  tablet (100 mg total) by mouth 2 (two) times daily. 05/13/14  Yes Runell Gess, MD  furosemide (LASIX) 20 MG tablet Take 1 tablet (20 mg total) by mouth daily as needed. For weight gain of 3 pounds or more. 05/30/14  Yes Ryan M Dunn, PA-C  lansoprazole (PREVACID) 15 MG capsule Take 15 mg by mouth daily as needed (reflux).    Yes Historical Provider, MD  Multiple Vitamin (MULTIVITAMIN WITH MINERALS) TABS Take 1 tablet by mouth daily.   Yes Historical Provider, MD  Multiple Vitamins-Minerals (ICAPS) TABS Take 2 tablets by mouth 2 (two) times daily.   Yes Historical Provider, MD  nitrofurantoin (MACRODANTIN) 100 MG capsule Take 100 mg by mouth daily.  12/17/13  Yes Historical Provider, MD  nitroGLYCERIN (NITROSTAT) 0.4 MG SL tablet Place 1 tablet (0.4 mg total) under the tongue every 5 (five) minutes as needed for  chest pain. 05/30/14  Yes Ryan M Dunn, PA-C  Pitavastatin Calcium (LIVALO) 1 MG TABS Take 0.5 mg by mouth daily.    Yes Historical Provider, MD  tiotropium (SPIRIVA) 18 MCG inhalation capsule Place 18 mcg into inhaler and inhale daily.   Yes Historical Provider, MD  warfarin (COUMADIN) 2 MG tablet Take 2-4 mg by mouth See admin instructions. Take 4 mg on Monday then 2 mg the rest of the days   Yes Historical Provider, MD   Physical Exam: Blood pressure 158/56, pulse 58, temperature 97.5 F (36.4 C), temperature source Oral, resp. rate 13, SpO2 96 %. Filed Vitals:   12/18/14 0017  BP: 158/56  Pulse: 58  Temp:   Resp: 13    General:  NAD, resting comfortably in bed Eyes: PEERLA EOMI ENT: mucous membranes moist Neck: supple w/o JVD Cardiovascular: RRR w/o MRG Respiratory: CTA B Abdomen: soft, nt, nd, bs+ Skin: numerous bruises including wrist, shoulder, top of head, and to R eye Musculoskeletal: MAE, full ROM all 4 extremities Psychiatric: normal tone and affect Neurologic: AAOx3, grossly non-focal  Labs on Admission:  Basic Metabolic Panel:  Recent Labs Lab 12/17/14 2206   NA 125*  K 4.0  CL 91*  CO2 27  GLUCOSE 134*  BUN 23  CREATININE 1.14*  CALCIUM 9.7   Liver Function Tests:  Recent Labs Lab 12/17/14 2206  AST 26  ALT 15  ALKPHOS 87  BILITOT 1.1  PROT 7.0  ALBUMIN 3.5   No results for input(s): LIPASE, AMYLASE in the last 168 hours. No results for input(s): AMMONIA in the last 168 hours. CBC:  Recent Labs Lab 12/17/14 2206  WBC 9.8  HGB 10.9*  HCT 32.4*  MCV 87.8  PLT 221   Cardiac Enzymes: No results for input(s): CKTOTAL, CKMB, CKMBINDEX, TROPONINI in the last 168 hours. BNP: Invalid input(s): POCBNP CBG: No results for input(s): GLUCAP in the last 168 hours.  Radiological Exams on Admission: Dg Chest 2 View  12/18/2014   CLINICAL DATA:  Status post fall onto right side. Near syncope. Concern for chest injury. Initial encounter.  EXAM: CHEST  2 VIEW  COMPARISON:  Chest radiograph performed 10/01/2014  FINDINGS: The lungs are well-aerated. Mild right mid lung opacity is of uncertain significance. There is no evidence of pleural effusion or pneumothorax.  The heart is normal in size; the mediastinal contour is within normal limits. No acute osseous abnormalities are seen. There is chronic degenerative change at the left glenohumeral joint, with joint space loss and sclerotic change. There is some degree of bony remodeling at the left humeral head.  IMPRESSION: Mild right midlung airspace opacity is of uncertain significance and could reflect mild infection. Would correlate with the patient's symptoms. Further evaluation could be considered as deemed clinically appropriate, on an elective nonemergent basis. No displaced rib fracture seen.   Electronically Signed   By: Roanna RaiderJeffery  Chang M.D.   On: 12/18/2014 01:17   Dg Sacrum/coccyx  12/18/2014   CLINICAL DATA:  Status post fall onto right side, with sacrococcygeal pain. Initial encounter.  EXAM: SACRUM AND COCCYX - 2+ VIEW  COMPARISON:  Pelvis radiograph performed 06/30/2012  FINDINGS:  There is no evidence of fracture or dislocation. The sacrum and coccyx appear intact. The sacroiliac joints demonstrate mild sclerosis but are otherwise unremarkable. The lower lumbar spine is difficult to fully assess; there may be mild chronic loss of height at L5, though this is not characterized on the frontal view and may be artifactual.  A right hip hemiarthroplasty is grossly unremarkable in appearance, though incompletely imaged.  The visualized bowel gas pattern is grossly unremarkable in appearance.  IMPRESSION: No evidence of acute fracture or dislocation.   Electronically Signed   By: Roanna Raider M.D.   On: 12/18/2014 01:09   Dg Shoulder Right  12/18/2014   CLINICAL DATA:  Status post fall onto right side, with superior shoulder bruising. Initial encounter.  EXAM: RIGHT SHOULDER - 2+ VIEW  COMPARISON:  Chest radiograph performed 10/01/2014  FINDINGS: There is no evidence of fracture or dislocation. The right humeral head is seated within the glenoid fossa. The acromioclavicular joint is unremarkable in appearance. No significant soft tissue abnormalities are seen. The visualized portions of the right lung are clear.  IMPRESSION: No evidence of fracture or dislocation.   Electronically Signed   By: Roanna Raider M.D.   On: 12/18/2014 01:06   Dg Forearm Right  12/18/2014   CLINICAL DATA:  Status post fall onto right side, with right forearm pain and bruising. Initial encounter.  EXAM: RIGHT FOREARM - 2 VIEW  COMPARISON:  None.  FINDINGS: There is no evidence of fracture or dislocation. The radius and ulna appear intact. Soft tissue swelling is noted about the proximal forearm. The elbow joint is incompletely assessed, but appears grossly unremarkable. The carpal rows appear grossly intact, and demonstrate normal alignment. Dorsal soft swelling is noted at the wrist.  IMPRESSION: No evidence of fracture or dislocation.   Electronically Signed   By: Roanna Raider M.D.   On: 12/18/2014 01:14   Ct  Head Wo Contrast  12/18/2014   CLINICAL DATA:  Larey Seat at home  EXAM: CT HEAD WITHOUT CONTRAST  CT CERVICAL SPINE WITHOUT CONTRAST  TECHNIQUE: Multidetector CT imaging of the head and cervical spine was performed following the standard protocol without intravenous contrast. Multiplanar CT image reconstructions of the cervical spine were also generated.  COMPARISON:  None.  FINDINGS: CT HEAD FINDINGS  There is no intracranial hemorrhage, mass or evidence of acute infarction. There is moderately severe generalized atrophy. There is mild chronic microvascular ischemic change. There is no significant extra-axial fluid collection.  No acute intracranial findings are evident. The calvarium and skullbase are intact. There is chronic left maxillary sinus opacification with associated bony sclerosis and thickening.  CT CERVICAL SPINE FINDINGS  The vertebral column, pedicles and facet articulations are intact. There is no evidence of acute fracture. No acute soft tissue abnormalities are evident.  Moderately severe degenerative disc changes are present from C3 through C7 with posterior osteophytes.  IMPRESSION: 1. Negative for acute intracranial traumatic injury 2. Negative for acute cervical spine fracture 3. Moderate generalized atrophy and chronic microvascular ischemia 4. Cervical degenerative disc changes   Electronically Signed   By: Ellery Plunk M.D.   On: 12/18/2014 01:20   Ct Cervical Spine Wo Contrast  12/18/2014   CLINICAL DATA:  Larey Seat at home  EXAM: CT HEAD WITHOUT CONTRAST  CT CERVICAL SPINE WITHOUT CONTRAST  TECHNIQUE: Multidetector CT imaging of the head and cervical spine was performed following the standard protocol without intravenous contrast. Multiplanar CT image reconstructions of the cervical spine were also generated.  COMPARISON:  None.  FINDINGS: CT HEAD FINDINGS  There is no intracranial hemorrhage, mass or evidence of acute infarction. There is moderately severe generalized atrophy. There is  mild chronic microvascular ischemic change. There is no significant extra-axial fluid collection.  No acute intracranial findings are evident. The calvarium and skullbase are intact. There is  chronic left maxillary sinus opacification with associated bony sclerosis and thickening.  CT CERVICAL SPINE FINDINGS  The vertebral column, pedicles and facet articulations are intact. There is no evidence of acute fracture. No acute soft tissue abnormalities are evident.  Moderately severe degenerative disc changes are present from C3 through C7 with posterior osteophytes.  IMPRESSION: 1. Negative for acute intracranial traumatic injury 2. Negative for acute cervical spine fracture 3. Moderate generalized atrophy and chronic microvascular ischemia 4. Cervical degenerative disc changes   Electronically Signed   By: Ellery Plunk M.D.   On: 12/18/2014 01:20   Dg Hand Complete Right  12/18/2014   CLINICAL DATA:  Status post fall onto right side, with right hand bruising and dorsal hematoma. Initial encounter.  EXAM: RIGHT HAND - COMPLETE 3+ VIEW  COMPARISON:  None.  FINDINGS: There is no evidence of fracture or dislocation. Mild joint space narrowing is noted at the first carpometacarpal joint, with sclerosis, likely reflecting mild osteoarthritis. There is calcification at the triangular fibrocartilage.  The patient is status post amputation at the distal second and third digits, with chronic osseous fusion at the proximal and middle phalanges of the second, third and fourth digits. Degenerative joint space narrowing is noted at the fourth distal interphalangeal joint and the interphalangeal joints of the fifth digit.  The carpal rows are otherwise intact, and demonstrate normal alignment. Soft tissue swelling is noted dorsal to the wrist.  IMPRESSION: 1. No evidence of acute fracture or dislocation. 2. Postoperative changes at the second, third and fourth digits. Underlying osteoarthritis noted. Calcification at the  triangular fibrocartilage.   Electronically Signed   By: Roanna Raider M.D.   On: 12/18/2014 01:11    EKG: Independently reviewed.  Time spent: 70 min  Brantlee Hinde M. [facility] Pager [phone]  If 7PM-7AM, please contact night-coverage [email or web address] Password [email or web address] 12/18/2014, 2:42 AM

## 2014-12-18 NOTE — ED Notes (Signed)
Patient and patient's son refused to do orthostatic vital signs. Son states that, " she can barely stand and it take two of us to move her in bed."

## 2014-12-18 NOTE — Discharge Instructions (Signed)

## 2015-01-30 ENCOUNTER — Ambulatory Visit (INDEPENDENT_AMBULATORY_CARE_PROVIDER_SITE_OTHER): Payer: Medicare Other | Admitting: Cardiology

## 2015-01-30 ENCOUNTER — Encounter: Payer: Self-pay | Admitting: Cardiology

## 2015-01-30 VITALS — BP 160/78 | HR 67 | Ht 65.5 in | Wt 159.3 lb

## 2015-01-30 DIAGNOSIS — R0989 Other specified symptoms and signs involving the circulatory and respiratory systems: Secondary | ICD-10-CM

## 2015-01-30 DIAGNOSIS — I480 Paroxysmal atrial fibrillation: Secondary | ICD-10-CM

## 2015-01-30 DIAGNOSIS — I10 Essential (primary) hypertension: Secondary | ICD-10-CM | POA: Diagnosis not present

## 2015-01-30 NOTE — Patient Instructions (Addendum)
Your physician has requested that you have a carotid duplex. This test is an ultrasound of the carotid arteries in your neck. It looks at blood flow through these arteries that supply the brain with blood. Allow one hour for this exam. There are no restrictions or special instructions.  This will be done in 6 months with appointment to see Dr.Berry afterwards.  Your physician has recommended you make the following change in your medication:STOP aspirin .

## 2015-01-30 NOTE — Progress Notes (Signed)
Cardiology Office Note   Date:  01/30/2015   ID:  Madeline Serrano, DOB [birth year], MRN [id number]  PCP:  Martha Clan, MD  Cardiologist:  Dr. Allyson Sabal    Chief Complaint  Patient presents with  . Atrial Fibrillation    SOB (emphysema) on exertion, pain in knees when walking/ pt declines chest pain, swelling, dizziness      History of Present Illness: Madeline Serrano is a 81 y.o. female who presents for eval of her PAF and HTN.   She has a history of atrial fibrillation with RVR in the past, on Coumadin anticoagulation and flecainide, maintaining sinus rhythm. With recent fall, will stop her ASA.  Her other problems include hypertension, hyperlipidemia, and positive family history for heart disease with a father who died of an MI at age 40. She also has GERD. By echocardiogram, she has normal LV size and function with mild left atrial enlargement and normal Myoview. She has had no episodes of breakthrough PAF since her flecainide dose was adjusted.. She did break her hip back in January of 2013 and had hip replacement by Dr. Lajoyce Corners with prolonged hospitalization and rehab at [facility]. She also had several TIAs in the past , and had carotid Dopplers that were entirely normal.. She has been adjusting her blood pressure medicines because of symptomatic hypotension in the morning after taking her Coreg and hypertension later in the afternoon. Hx of diastolic heart failure requiring hospitalization  Dr. Lowell Guitar is adjusting her BP meds now.  Recent fall due to orthostatic hypotension.  The clonidine has been stopped and She take coreg depending on her BP in the AM.  She only takes lasix for swelling and has not needed with adjustment in her BP meds.    She denies any chest pain.  No significant SOB- her usual with COPD.  Dr. Clelia Croft follow her cholesterol levels.  No palpitations or racing HR.     Past Medical History  Diagnosis Date  . PAF (paroxysmal atrial fibrillation)     NSR on Flecanide  .  Hypertension     labile  . High cholesterol   . Macular degeneration   . GERD (gastroesophageal reflux disease)   . TIA (transient ischemic attack)     several in past  . DJD (degenerative joint disease) 10/13    s/p rt THR  . Chronic anticoagulation   . Aortic valve stenosis, mild 11/13  . Ejection fraction   . Diastolic heart failure     Past Surgical History  Procedure Laterality Date  . Hip arthroplasty  06/30/2012    Procedure: ARTHROPLASTY BIPOLAR HIP;  Surgeon: Shelda Pal, MD;  Location: [facility] OR;  Service: Orthopedics;  Laterality: Right;  right hip hemiarthroplasty  . Carotid duplex  03/20/2012    Bilateral Bulb/Proximal ICAs-mild plaque, mildly abnormal scan  . Cardiovascular stress test  10/13/2010    Normal study, no ECG changes  . Transthoracic echocardiogram  08/25/2012    EF 60-65%, moderately calcified leaflets of the aortic valve, LA moderately dilated,     Current Outpatient Prescriptions  Medication Sig Dispense Refill  . CALCIUM PO Take 1 tablet by mouth 3 (three) times daily.     . carvedilol (COREG) 12.5 MG tablet Take 12.5 mg by mouth. 12.5 mg in the morning and 6.25 mg in the evening as needed.    . diazepam (VALIUM) 5 MG tablet 5 mg as needed. Patient takes 1/4 tablet  0  . fexofenadine (ALLEGRA) 180  MG tablet Take 180 mg by mouth daily as needed for allergies.     . fish oil-omega-3 fatty acids 1000 MG capsule Take 1 g by mouth daily.    . flecainide (TAMBOCOR) 100 MG tablet Take 1 tablet (100 mg total) by mouth 2 (two) times daily. 180 tablet 2  . furosemide (LASIX) 20 MG tablet Take 1 tablet (20 mg total) by mouth daily as needed. For weight gain of 3 pounds or more. 30 tablet 1  . hydrALAZINE (APRESOLINE) 25 MG tablet Take 1 tablet by mouth 3 (three) times daily.  0  . lansoprazole (PREVACID) 15 MG capsule Take 15 mg by mouth daily as needed (reflux).     . Multiple Vitamin (MULTIVITAMIN WITH MINERALS) TABS Take 1 tablet by mouth daily.    . Multiple  Vitamins-Minerals (ICAPS) TABS Take 2 tablets by mouth 2 (two) times daily.    . nitrofurantoin (MACRODANTIN) 100 MG capsule Take 100 mg by mouth daily.     . nitroGLYCERIN (NITROSTAT) 0.4 MG SL tablet Place 1 tablet (0.4 mg total) under the tongue every 5 (five) minutes as needed for chest pain. 25 tablet 12  . Pitavastatin Calcium (LIVALO) 1 MG TABS Take 0.5 mg by mouth daily.     Marland Kitchen. tiotropium (SPIRIVA) 18 MCG inhalation capsule Place 18 mcg into inhaler and inhale daily.    Marland Kitchen. warfarin (COUMADIN) 2 MG tablet Take 2-4 mg by mouth See admin instructions. Take 4 mg on Monday then 2 mg the rest of the days     No current facility-administered medications for this visit.    Allergies:   Sulfa antibiotics; Celebrex; Chlorthalidone; Clarithromycin; Other; Statins; Vioxx; and Penicillins    Social History:  The patient  reports that she has never smoked. She has never used smokeless tobacco. She reports that she does not drink alcohol or use illicit drugs.   Family History:  The patient's family history includes Cancer in her son; Diabetes in her father and son; Emphysema in her mother; Heart attack (age of onset: 6655) in her father; Heart disease in her paternal grandfather and paternal grandmother; Pancreatitis in her son; Stroke in her maternal grandfather.    ROS:  General:no colds or fevers, no weight changes Skin:no rashes or ulcers HEENT:no blurred vision, no congestion CV:see HPI PUL:see HPI GI:no diarrhea constipation or melena, no indigestion GU:no hematuria, no dysuria MS:no joint pain, no claudication Neuro:no syncope, no lightheadedness Endo:no diabetes, no thyroid disease  Wt Readings from Last 3 Encounters:  01/30/15 159 lb 4.8 oz (72.258 kg)  10/01/14 155 lb (70.308 kg)  07/26/14 160 lb 11.2 oz (72.893 kg)     PHYSICAL EXAM: VS:  BP 160/78 mmHg  Pulse 67  Ht 5' 5.5" (1.664 m)  Wt 159 lb 4.8 oz (72.258 kg)  BMI 26.10 kg/m2 , BMI Body mass index is 26.1  kg/(m^2). General:Pleasant affect, NAD Skin:Warm and dry, brisk capillary refill HEENT:normocephalic, sclera clear, mucus membranes moist Neck:supple, no JVD, + bil bruits  Heart:S1S2 RRR with soft systolic murmur, no gallup, rub or click Lungs:clear without rales, rhonchi, or wheezes JYN:WGNFAbd:soft, non tender, + BS, do not palpate liver spleen or masses Ext:tr lower ext edema,  2+ radial pulses Neuro:alert and oriented X 3, MAE, follows commands, + facial symmetry    EKG:  EKG is ordered today. The ekg ordered today demonstrates SR 1st degree AV block with PR 226 ms.  No acute changes.    Recent Labs: 05/27/2014: Pro B Natriuretic peptide (  BNP) 1295.0* 10/01/2014: B Natriuretic Peptide 470.0* 12/17/2014: ALT 15; BUN 23; Creatinine 1.14*; Hemoglobin 10.9*; Platelets 221; Potassium 4.0; Sodium 125*    Lipid Panel    Component Value Date/Time   CHOL 161 08/26/2012 0615   TRIG 265* 08/26/2012 0615   HDL 40 08/26/2012 0615   CHOLHDL 4.0 08/26/2012 0615   VLDL 53* 08/26/2012 0615   LDLCALC 68 08/26/2012 0615       Other studies Reviewed: Additional studies/ records that were reviewed today include: previous notes, ER visit.   ASSESSMENT AND PLAN:  1.  PAF NSR on Flecanide Maintaining normal sinus rhythm on flecainide and Coumadin anticoagulation will stop ASA with her recent fall and no CAD. She will follow up with Dr. Erlene Quan in 6 months unless develops problems earlier.   2. TIA (transient ischemic attack) History of TIAs in the past with normal carotid Dopplers last checked in November 2013  3. Chronic diastolic heart failure Recently admitted in Augus 2015t for diastolic heart failure. She is currently on low-dose Lasix prn and has not needed recently. She denies chest pain or shortness of breath.  4.  Hypertension Her hypertension is labile. She checks her blood pressure 3 times a day and it is above a certain level she takes when necessary clonidine. She is very well  aware when her blood pressure is elevated or low. She's had normal renal Dopplers in the past.  Dr. Lowell Guitar is following now.  5.  High cholesterol  On statin therapy, followed by her PCP.  6. Bil carotid bruits.  Will recheck carotid duplex prior to next visit with Dr. Erlene Quan.       Current medicines are reviewed with the patient today.  The patient Has no concerns regarding medicines.  The following changes have been made:  See above Labs/ tests ordered today include:see above  Disposition:   FU:  see above  Nyoka Lint, NP  01/30/2015 5:42 PM    [facility] [address] [place], [place], [place]  [address]/ [address] [place], [place] Phone: [phone]; Fax: [phone]  [phone]

## 2015-02-10 ENCOUNTER — Telehealth: Payer: Self-pay | Admitting: Cardiovascular Disease

## 2015-02-10 MED ORDER — FLECAINIDE ACETATE 100 MG PO TABS: 100.0000 mg | ORAL_TABLET | Freq: Two times a day (BID) | ORAL | Status: AC

## 2015-02-10 NOTE — Telephone Encounter (Signed)
Rx refill sent to patient pharmacy   

## 2015-02-10 NOTE — Telephone Encounter (Signed)
°  1. Which medications need to be refilled? Flecainide  2. Which pharmacy is medication to be sent to?[facility]-[phone]  3. Do they need a 30 day or 90 day supply? #180 and refills  4. Would they like a call back once the medication has been sent to the pharmacy? yes

## 2015-02-13 ENCOUNTER — Encounter (HOSPITAL_COMMUNITY): Payer: BLUE CROSS/BLUE SHIELD

## 2015-09-10 ENCOUNTER — Encounter: Payer: Self-pay | Admitting: Cardiovascular Disease

## 2015-09-10 ENCOUNTER — Ambulatory Visit (INDEPENDENT_AMBULATORY_CARE_PROVIDER_SITE_OTHER): Payer: Medicare Other | Admitting: Cardiovascular Disease

## 2015-09-10 VITALS — BP 164/74 | HR 60 | Ht 65.0 in | Wt 157.0 lb

## 2015-09-10 DIAGNOSIS — I5032 Chronic diastolic (congestive) heart failure: Secondary | ICD-10-CM

## 2015-09-10 DIAGNOSIS — I10 Essential (primary) hypertension: Secondary | ICD-10-CM | POA: Diagnosis not present

## 2015-09-10 DIAGNOSIS — E7800 Pure hypercholesterolemia, unspecified: Secondary | ICD-10-CM

## 2015-09-10 DIAGNOSIS — I480 Paroxysmal atrial fibrillation: Secondary | ICD-10-CM

## 2015-09-10 MED ORDER — PITAVASTATIN CALCIUM 1 MG PO TABS: 0.5000 mg | ORAL_TABLET | Freq: Every day | ORAL | Status: AC

## 2015-09-10 NOTE — Progress Notes (Signed)
09/10/2015 Madeline Serrano   Jan 17, 1930  [id number]  Primary Physician Martha ClanShaw, William, MD Primary Cardiologist: Runell GessJonathan J. Madisun Hargrove MD Roseanne RenoFACP,FACC,FAHA, FSCAI   HPI:  The patient is a very pleasant 81 year old mildly overweight, widowed Caucasian female, mother of 2 sons, grandmother of 2 grandchildren, who I last saw in the office 07/26/14. She is originally from [place] and moved [place]. She has a history of atrial fibrillation with RVR in the past, on Coumadin anticoagulation and flecainide, maintaining sinus rhythm. Her other problems include hypertension, hyperlipidemia, and positive family history for heart disease with a father who died of an MI at age 79. She also has GERD. By echocardiogram, she has normal LV size and function with mild left atrial enlargement and normal Myoview. She has had no episodes of breakthrough PAF since her flecainide dose was adjusted.. She did break her hip back in January of 2013 and had hip replacement by Dr. Lajoyce CornersMatt Olin with prolonged [facility] and [facility]. She also had several TIAs in the past , and had carotid Dopplers that were entirely normal.. She has been adjusting her blood pressure medicines because of symptomatic hypotension in the morning after taking her Coreg and hypertension later in the afternoon. She was admitted to the hospital with diastolic heart failure in August and was diuresed. She does have labile hypertension and was given when necessary clonidine by Dr. Louanne Skyeoug Shaw.since I saw her a year ago she's remained clinically stable denying chest pain or shortness of breath. Dr. Lowell GuitarPowell recently referred her to [facility]amel[facility]Levi [facility] for further evaluation of labile hypertension.   Current Outpatient Prescriptions  Medication Sig Dispense Refill  . CALCIUM PO Take 1 tablet by mouth 3 (three) times daily.     . carvedilol (COREG) 12.5 MG tablet Take 12.5 mg by mouth. 12.5 mg in the morning  and 6.25 mg in the evening as needed.    . diazepam (VALIUM) 5 MG tablet 5 mg as needed. Patient takes 1/4 tablet  0  . fexofenadine (ALLEGRA) 180 MG tablet Take 180 mg by mouth daily as needed for allergies.     . fish oil-omega-3 fatty acids 1000 MG capsule Take 1 g by mouth daily.    . flecainide (TAMBOCOR) 100 MG tablet Take 1 tablet (100 mg total) by mouth 2 (two) times daily. 180 tablet 2  . furosemide (LASIX) 20 MG tablet Take 1 tablet (20 mg total) by mouth daily as needed. For weight gain of 3 pounds or more. 30 tablet 1  . hydrALAZINE (APRESOLINE) 25 MG tablet Take 1 tablet by mouth 3 (three) times daily.  0  . lansoprazole (PREVACID) 15 MG capsule Take 15 mg by mouth daily as needed (reflux).     . Multiple Vitamin (MULTIVITAMIN WITH MINERALS) TABS Take 1 tablet by mouth daily.    . Multiple Vitamins-Minerals (ICAPS) TABS Take 2 tablets by mouth 2 (two) times daily.    . nitrofurantoin (MACRODANTIN) 100 MG capsule Take 100 mg by mouth daily.     . nitroGLYCERIN (NITROSTAT) 0.4 MG SL tablet Place 1 tablet (0.4 mg total) under the tongue every 5 (five) minutes as needed for chest pain. 25 tablet 12  . Pitavastatin Calcium (LIVALO) 1 MG TABS Take 0.5 tablets (0.5 mg total) by mouth daily. 30 tablet 11  . tiotropium (SPIRIVA) 18 MCG inhalation capsule Place 18 mcg into inhaler and inhale daily.    Marland Kitchen. warfarin (COUMADIN)  2 MG tablet Take 2-4 mg by mouth See admin instructions. TAKE 2 MG EVERYDAY EXCEPT TAKE 4 MG ON TUESDAY AND SATURDAY.     No current facility-administered medications for this visit.    Allergies  Allergen Reactions  . Sulfa Antibiotics Other (See Comments)    [name]  . Celebrex [Celecoxib] Swelling  . Chlorthalidone Nausea Only    Lightheadedness, weakness, so "sick feeling"  . Clarithromycin Swelling  . Other     EXTENDED RELEASE tablets  . Statins Other (See Comments)    Hair fell out  . Vioxx [Rofecoxib] Other (See Comments)    Renal problems  .  Penicillins Rash    Social History   Social History  . Marital Status: Widowed    Spouse Name: N/A  . Number of Children: N/A  . Years of Education: N/A   Occupational History  . Not on file.   Social History Main Topics  . Smoking status: Never Smoker   . Smokeless tobacco: Never Used  . Alcohol Use: No  . Drug Use: No  . Sexual Activity: Not on file   Other Topics Concern  . Not on file   Social History Narrative     Review of Systems: General: negative for chills, fever, night sweats or weight changes.  Cardiovascular: negative for chest pain, dyspnea on exertion, edema, orthopnea, palpitations, paroxysmal nocturnal dyspnea or shortness of breath Dermatological: negative for rash Respiratory: negative for cough or wheezing Urologic: negative for hematuria Abdominal: negative for nausea, vomiting, diarrhea, bright red blood per rectum, melena, or hematemesis Neurologic: negative for visual changes, syncope, or dizziness All other systems reviewed and are otherwise negative except as noted above.    Blood pressure 164/74, pulse 60, height  (1.651 m), weight 157 lb (71.215 kg).  General appearance: alert and no distress Neck: no adenopathy, no JVD, supple, symmetrical, trachea midline, thyroid not enlarged, symmetric, no tenderness/mass/nodules and bilateral carotid bruits right lateral to left Lungs: clear to auscultation bilaterally Heart: regular rate and rhythm, S1, S2 normal, no murmur, click, rub or gallop Extremities: extremities normal, atraumatic, no cyanosis or edema  EKG not performed today  ASSESSMENT AND PLAN:   PAF NSR on Flecanide History of PAF maintaining sinus rhythm on flecainide and Coumadin anticoagulation followed by Dr. Clelia Croft  Hypertension History of labile hypertension on carvedilol, hydralazine. She has symptomatic orthostatic hypotension. Apparently Dr. Lowell Guitar recently referred her to Dr. Pamelia Hoit at [facility]  for a more thorough evaluation of her hypertension.  High cholesterol History of hyperlipidemia on statin therapy followed by her PCP  Chronic diastolic heart failure History of diastolic heart failure with preserved EF by 2-D echo 05/28/14 and mild aortic stenosis. She is on when necessary diuretics for weight gain. She weighs herself on a daily basis.      Runell Gess MD FACP,FACC,FAHA, [facility] 09/10/2015 9:58 AM

## 2015-09-10 NOTE — Patient Instructions (Signed)

## 2015-09-10 NOTE — Assessment & Plan Note (Signed)
History of labile hypertension on carvedilol, hydralazine. She has symptomatic orthostatic hypotension. Apparently Dr. Lowell GuitarPowell recently referred her to Dr. Pamel[facility]Levi at [facility] for a more thorough evaluation of her hypertension.

## 2015-09-10 NOTE — Assessment & Plan Note (Signed)
History of PAF maintaining sinus rhythm on flecainide and Coumadin anticoagulation followed by Dr. Clelia CroftShaw

## 2015-09-10 NOTE — Assessment & Plan Note (Signed)
History of hyperlipidemia on statin therapy followed by her PCP. 

## 2015-09-10 NOTE — Assessment & Plan Note (Signed)
History of diastolic heart failure with preserved EF by 2-D echo 05/28/14 and mild aortic stenosis. She is on when necessary diuretics for weight gain. She weighs herself on a daily basis.

## 2015-09-24 ENCOUNTER — Inpatient Hospital Stay (HOSPITAL_COMMUNITY)
Admission: EM | Admit: 2015-09-24 | Discharge: 2015-09-30 | DRG: 292 | Disposition: A | Discharge status: Skilled Nursing Facility | Payer: Medicare Other | Attending: Internal Medicine | Admitting: Internal Medicine

## 2015-09-24 ENCOUNTER — Emergency Department (HOSPITAL_COMMUNITY): Payer: Medicare Other

## 2015-09-24 ENCOUNTER — Encounter (HOSPITAL_COMMUNITY): Payer: Self-pay | Admitting: Emergency Medicine

## 2015-09-24 DIAGNOSIS — J90 Pleural effusion, not elsewhere classified: Secondary | ICD-10-CM

## 2015-09-24 DIAGNOSIS — I5033 Acute on chronic diastolic (congestive) heart failure: Secondary | ICD-10-CM | POA: Diagnosis present

## 2015-09-24 DIAGNOSIS — R911 Solitary pulmonary nodule: Secondary | ICD-10-CM | POA: Diagnosis present

## 2015-09-24 DIAGNOSIS — E871 Hypo-osmolality and hyponatremia: Secondary | ICD-10-CM | POA: Diagnosis present

## 2015-09-24 DIAGNOSIS — R0600 Dyspnea, unspecified: Secondary | ICD-10-CM

## 2015-09-24 DIAGNOSIS — I350 Nonrheumatic aortic (valve) stenosis: Secondary | ICD-10-CM | POA: Diagnosis present

## 2015-09-24 DIAGNOSIS — H3530 Unspecified macular degeneration: Secondary | ICD-10-CM | POA: Diagnosis present

## 2015-09-24 DIAGNOSIS — J438 Other emphysema: Secondary | ICD-10-CM

## 2015-09-24 DIAGNOSIS — R0602 Shortness of breath: Secondary | ICD-10-CM | POA: Diagnosis present

## 2015-09-24 DIAGNOSIS — I480 Paroxysmal atrial fibrillation: Secondary | ICD-10-CM | POA: Diagnosis present

## 2015-09-24 DIAGNOSIS — R1319 Other dysphagia: Secondary | ICD-10-CM | POA: Diagnosis present

## 2015-09-24 DIAGNOSIS — K219 Gastro-esophageal reflux disease without esophagitis: Secondary | ICD-10-CM | POA: Diagnosis present

## 2015-09-24 DIAGNOSIS — J42 Unspecified chronic bronchitis: Secondary | ICD-10-CM | POA: Diagnosis not present

## 2015-09-24 DIAGNOSIS — T148XXA Other injury of unspecified body region, initial encounter: Secondary | ICD-10-CM

## 2015-09-24 DIAGNOSIS — J449 Chronic obstructive pulmonary disease, unspecified: Secondary | ICD-10-CM | POA: Diagnosis present

## 2015-09-24 DIAGNOSIS — M25531 Pain in right wrist: Secondary | ICD-10-CM

## 2015-09-24 DIAGNOSIS — R1314 Dysphagia, pharyngoesophageal phase: Secondary | ICD-10-CM | POA: Diagnosis not present

## 2015-09-24 DIAGNOSIS — E785 Hyperlipidemia, unspecified: Secondary | ICD-10-CM | POA: Diagnosis present

## 2015-09-24 DIAGNOSIS — S300XXD Contusion of lower back and pelvis, subsequent encounter: Secondary | ICD-10-CM | POA: Diagnosis not present

## 2015-09-24 DIAGNOSIS — R269 Unspecified abnormalities of gait and mobility: Secondary | ICD-10-CM | POA: Diagnosis present

## 2015-09-24 DIAGNOSIS — I951 Orthostatic hypotension: Secondary | ICD-10-CM | POA: Diagnosis present

## 2015-09-24 DIAGNOSIS — Z96641 Presence of right artificial hip joint: Secondary | ICD-10-CM | POA: Diagnosis present

## 2015-09-24 DIAGNOSIS — I10 Essential (primary) hypertension: Secondary | ICD-10-CM | POA: Diagnosis present

## 2015-09-24 DIAGNOSIS — I5032 Chronic diastolic (congestive) heart failure: Secondary | ICD-10-CM | POA: Diagnosis not present

## 2015-09-24 DIAGNOSIS — M179 Osteoarthritis of knee, unspecified: Secondary | ICD-10-CM | POA: Diagnosis present

## 2015-09-24 DIAGNOSIS — S300XXA Contusion of lower back and pelvis, initial encounter: Secondary | ICD-10-CM | POA: Diagnosis present

## 2015-09-24 DIAGNOSIS — W1830XA Fall on same level, unspecified, initial encounter: Secondary | ICD-10-CM | POA: Diagnosis present

## 2015-09-24 DIAGNOSIS — Z66 Do not resuscitate: Secondary | ICD-10-CM | POA: Diagnosis present

## 2015-09-24 DIAGNOSIS — M25539 Pain in unspecified wrist: Secondary | ICD-10-CM

## 2015-09-24 DIAGNOSIS — R1310 Dysphagia, unspecified: Secondary | ICD-10-CM

## 2015-09-24 DIAGNOSIS — I110 Hypertensive heart disease with heart failure: Principal | ICD-10-CM | POA: Diagnosis present

## 2015-09-24 DIAGNOSIS — J441 Chronic obstructive pulmonary disease with (acute) exacerbation: Secondary | ICD-10-CM | POA: Diagnosis not present

## 2015-09-24 DIAGNOSIS — I509 Heart failure, unspecified: Secondary | ICD-10-CM

## 2015-09-24 DIAGNOSIS — R55 Syncope and collapse: Secondary | ICD-10-CM

## 2015-09-24 DIAGNOSIS — Z8673 Personal history of transient ischemic attack (TIA), and cerebral infarction without residual deficits: Secondary | ICD-10-CM

## 2015-09-24 DIAGNOSIS — Z7901 Long term (current) use of anticoagulants: Secondary | ICD-10-CM

## 2015-09-24 DIAGNOSIS — R339 Retention of urine, unspecified: Secondary | ICD-10-CM

## 2015-09-24 LAB — CBC WITH DIFFERENTIAL/PLATELET
Basophils Absolute: 0 10*3/uL (ref 0.0–0.1)
Basophils Relative: 0 %
Eosinophils Absolute: 0.3 10*3/uL (ref 0.0–0.7)
Eosinophils Relative: 4 %
HCT: 28.5 % — ABNORMAL LOW (ref 36.0–46.0)
HEMOGLOBIN: 9.5 g/dL — AB (ref 12.0–15.0)
Lymphocytes Relative: 9 %
Lymphs Abs: 0.7 10*3/uL (ref 0.7–4.0)
MCH: 31 pg (ref 26.0–34.0)
MCHC: 33.3 g/dL (ref 30.0–36.0)
MCV: 93.1 fL (ref 78.0–100.0)
Monocytes Absolute: 0.6 10*3/uL (ref 0.1–1.0)
Monocytes Relative: 7 %
NEUTROS PCT: 80 %
Neutro Abs: 6 10*3/uL (ref 1.7–7.7)
Platelets: 183 10*3/uL (ref 150–400)
RBC: 3.06 MIL/uL — AB (ref 3.87–5.11)
RDW: 14.6 % (ref 11.5–15.5)
WBC: 7.5 10*3/uL (ref 4.0–10.5)

## 2015-09-24 LAB — URINE MICROSCOPIC-ADD ON

## 2015-09-24 LAB — URINALYSIS, ROUTINE W REFLEX MICROSCOPIC
Bilirubin Urine: NEGATIVE
Glucose, UA: NEGATIVE mg/dL
Hgb urine dipstick: NEGATIVE
KETONES UR: NEGATIVE mg/dL
NITRITE: NEGATIVE
PH: 7 (ref 5.0–8.0)
Protein, ur: NEGATIVE mg/dL
Specific Gravity, Urine: 1.014 (ref 1.005–1.030)

## 2015-09-24 LAB — COMPREHENSIVE METABOLIC PANEL
ALK PHOS: 97 U/L (ref 38–126)
ALT: 15 U/L (ref 14–54)
ANION GAP: 7 (ref 5–15)
AST: 31 U/L (ref 15–41)
Albumin: 3.1 g/dL — ABNORMAL LOW (ref 3.5–5.0)
BILIRUBIN TOTAL: 1.2 mg/dL (ref 0.3–1.2)
BUN: 11 mg/dL (ref 6–20)
CO2: 24 mmol/L (ref 22–32)
Calcium: 9.1 mg/dL (ref 8.9–10.3)
Chloride: 94 mmol/L — ABNORMAL LOW (ref 101–111)
Creatinine, Ser: 0.99 mg/dL (ref 0.44–1.00)
GFR, EST AFRICAN AMERICAN: 59 mL/min — AB (ref >60)
GFR, EST NON AFRICAN AMERICAN: 51 mL/min — AB (ref >60)
Glucose, Bld: 146 mg/dL — ABNORMAL HIGH (ref 65–99)
Potassium: 4.2 mmol/L (ref 3.5–5.1)
SODIUM: 125 mmol/L — AB (ref 135–145)
Total Protein: 5.6 g/dL — ABNORMAL LOW (ref 6.5–8.1)

## 2015-09-24 LAB — I-STAT CG4 LACTIC ACID, ED: Lactic Acid, Venous: 1.03 mmol/L (ref 0.5–2.0)

## 2015-09-24 LAB — I-STAT TROPONIN, ED: Troponin i, poc: 0 ng/mL (ref 0.00–0.08)

## 2015-09-24 LAB — BRAIN NATRIURETIC PEPTIDE: B NATRIURETIC PEPTIDE 5: 1102.4 pg/mL — AB (ref 0.0–100.0)

## 2015-09-24 MED ORDER — FUROSEMIDE 10 MG/ML IJ SOLN
40.0000 mg | Freq: Once | INTRAMUSCULAR | Status: AC
  Administered 2015-09-24: 40 mg via INTRAVENOUS
  Filled 2015-09-24: qty 4

## 2015-09-24 MED ORDER — LEVALBUTEROL HCL 0.63 MG/3ML IN NEBU
0.6300 mg | INHALATION_SOLUTION | RESPIRATORY_TRACT | Status: AC
  Administered 2015-09-24: 0.63 mg via RESPIRATORY_TRACT
  Filled 2015-09-24: qty 3

## 2015-09-24 NOTE — ED Notes (Signed)
Dr. Doutova at the bedside. 

## 2015-09-24 NOTE — H&P (Signed)
PCP: Martha Clan, MD  Cardiology [facility]ly[facility] Sabal   Referring provider Clydene Pugh   Chief Complaint:  Syncope and shortness of breath  HPI: Madeline Serrano is a 81 y.o. female   has a past medical history of PAF (paroxysmal atrial fibrillation) (HCC); Hypertension; High cholesterol; Macular degeneration; GERD (gastroesophageal reflux disease); TIA (transient ischemic attack); DJD (degenerative joint disease) (10/13); Chronic anticoagulation; Aortic valve stenosis, mild (11/13); Ejection fraction; and Diastolic heart failure (HCC).   Presented with  Reports feeling short of breath at baseline due to COPD. She has no been feeling well. She was supposed to have a PCP appointment and fell as was trying to get ready because she felt lightheaded. Deneis head injury but fell on her back.Burgess Estelle she apparently fell down secondary to feel lightheaded denies syncope. She presented to PCP who imaged er back no evidence of fracture.  Today she developed worsening dyspnea and cough at 5 PM she was very distressed and called 911 did not take additional lasix, presented emergency department and was found to have pulmonary edema. Family states she have not had good PO intake. Denies leg swelling,  Of note last echogram in August 2015 showed moderate aortic stenosis and preserved EF. Cardiology has been consulted that requested at this point medical admission. Patient denies associated chest pain haven't had any fevers or chills. Chest x-ray and emergent, show pulmonary edema but also 7 mm nodule opacity in the upper lobe recommending follow-up.   Patient history of atrial fibrillation with RVR on chronic anticoagulation with Coumadin been treated with flecanide currently in sinus rhythm. Patient in the past has had TIAs with gross odor negative workup carotid Dopplers were unremarkable. Evette Cristal and past admissions due to diastolic heart failure last time was in August 2016 regarding patient's blood pressure she has been on by  mouth) clonidine secondary to labile blood pressure readings and has been referred to [facility] secondary to poor control Hospitalist was called for admission for CHF exacerbation  Review of Systems:    Pertinent positives include: shortness of breath at rest. dyspnea on exertion, back pain, Bilateral lower extremity swelling syncope, productive cough,  Constitutional:  No weight loss, night sweats, Fevers, chills, fatigue, weight loss  HEENT:  No headaches, Difficulty swallowing,Tooth/dental problems,Sore throat,  No sneezing, itching, ear ache, nasal congestion, post nasal drip,  Cardio-vascular:  No chest pain, Orthopnea, PND, anasarca, dizziness, palpitations.no   GI:  No heartburn, indigestion, abdominal pain, nausea, vomiting, diarrhea, change in bowel habits, loss of appetite, melena, blood in stool, hematemesis Resp:    No excess mucus,   No non-productive cough, No coughing up of blood.No change in color of mucus.No wheezing. Skin:  no rash or lesions. No jaundice GU:  no dysuria, change in color of urine, no urgency or frequency. No straining to urinate.  No flank pain.  Musculoskeletal:  No joint pain or no joint swelling. No decreased range of motion. No back pain.  Psych:  No change in mood or affect. No depression or anxiety. No memory loss.  Neuro: no localizing neurological complaints, no tingling, no weakness, no double vision, no gait abnormality, no slurred speech, no confusion  Otherwise ROS are negative except for above, 10 systems were reviewed  Past Medical History: Past Medical History  Diagnosis Date  . PAF (paroxysmal atrial fibrillation) (HCC)     NSR on Flecanide  . Hypertension     labile  . High cholesterol   . Macular degeneration   . GERD (  gastroesophageal reflux disease)   . TIA (transient ischemic attack)     several in past  . DJD (degenerative joint disease) 10/13    s/p rt THR  . Chronic anticoagulation   . Aortic valve stenosis,  mild 11/13  . Ejection fraction   . Diastolic heart failure [facility](HCC)    Past Surgical History  Procedure Laterality Date  . Hip arthroplasty  06/30/2012    Procedure: ARTHROPLASTY BIPOLAR HIP;  Surgeon: Shelda PalMatthew D Olin, MD;  [facility];  Service: Orthopedics;  Laterality: Right;  right hip hemiarthroplasty  . Carotid duplex  03/20/2012    Bilateral Bulb/Proximal ICAs-mild plaque, mildly abnormal scan  . Cardiovascular stress test  10/13/2010    Normal study, no ECG changes  . Transthoracic echocardiogram  08/25/2012    EF 60-65%, moderately calcified leaflets of the aortic valve, LA moderately dilated,     Medications: Prior to Admission medications   Medication Sig Start Date End Date Taking? Authorizing Provider  CALCIUM PO Take 1 tablet by mouth 3 (three) times daily.    Yes Historical Provider, MD  carvedilol (COREG) 12.5 MG tablet Take 12.5 mg by mouth 2 (two) times daily with a meal.    Yes Historical Provider, MD  fish oil-omega-3 fatty acids 1000 MG capsule Take 1 g by mouth daily.   Yes Historical Provider, MD  flecainide (TAMBOCOR) 100 MG tablet Take 1 tablet (100 mg total) by mouth 2 (two) times daily. 02/10/15  Yes Runell GessJonathan J Berry, MD  furosemide (LASIX) 20 MG tablet Take 1 tablet (20 mg total) by mouth daily as needed. For weight gain of 3 pounds or more. 05/30/14  Yes Ryan M Dunn, PA-C  hydrALAZINE (APRESOLINE) 25 MG tablet Take 1 tablet by mouth 2 (two) times daily.  01/19/15  Yes Historical Provider, MD  lansoprazole (PREVACID) 15 MG capsule Take 15 mg by mouth daily as needed (reflux).    Yes Historical Provider, MD  Multiple Vitamin (MULTIVITAMIN WITH MINERALS) TABS tablet Take 1 tablet by mouth daily.   Yes Historical Provider, MD  Multiple Vitamins-Minerals (ICAPS AREDS 2) CAPS Take 2 capsules by mouth 2 (two) times daily. WITH LUTEIN AND ZEAXANTHIN   Yes Historical Provider, MD  nitrofurantoin (MACRODANTIN) 100 MG capsule Take 100 mg by mouth daily.  12/17/13  Yes  Historical Provider, MD  nitroGLYCERIN (NITROSTAT) 0.4 MG SL tablet Place 1 tablet (0.4 mg total) under the tongue every 5 (five) minutes as needed for chest pain. 05/30/14  Yes Ryan M Dunn, PA-C  Pitavastatin Calcium (LIVALO) 1 MG TABS Take 0.5 tablets (0.5 mg total) by mouth daily. 09/10/15  Yes Runell GessJonathan J Berry, MD  tiotropium (SPIRIVA) 18 MCG inhalation capsule Place 18 mcg into inhaler and inhale daily.   Yes Historical Provider, MD  warfarin (COUMADIN) 2 MG tablet Take 4 mg by mouth See admin instructions. TAKES 4MG  ONCE WEEKLY ON THURS ONLY   Yes Historical Provider, MD    Allergies:   Allergies  Allergen Reactions  . Chlorthalidone Nausea Only    Lightheadedness, weakness, so "sick feeling"  . Sulfa Antibiotics Other (See Comments)    Kathlen BrunswickStevens Johnsons  . Vioxx [Rofecoxib] Other (See Comments)    Renal problems  . Celebrex [Celecoxib] Swelling  . Clarithromycin Swelling  . Statins Other (See Comments)    Hair fell out  . Other Other (See Comments)    EXTENDED RELEASE tablets  . Penicillins Rash    Social History:  Ambulatory   Independently or cain  Walker at night Lives at home  With family     reports that she has never smoked. She has never used smokeless tobacco. She reports that she does not drink alcohol or use illicit drugs.    Family History: family history includes Cancer in her son; Diabetes in her father and son; Emphysema in her mother; Heart attack (age of onset: 36) in her father; Heart disease in her paternal grandfather and paternal grandmother; Pancreatitis in her son; Stroke in her maternal grandfather.    Physical Exam: Patient Vitals for the past 24 hrs:  BP Temp Temp src Pulse Resp SpO2 Height Weight  09/24/15 2245 (!) 109/51 mmHg - - (!) 54 17 100 % - -  09/24/15 2227 (!) 110/41 mmHg - - (!) 56 21 98 % - -  09/24/15 2130 (!) 127/53 mmHg - - (!) 53 20 96 % - -  09/24/15 2116 - 97.4 F (36.3 C) Rectal - - - - -  09/24/15 2100 123/77 mmHg - - (!)  55 23 98 % - -  09/24/15 2054 (!) 123/48 mmHg - - (!) 53 18 99 % - -  09/24/15 2015 105/80 mmHg - - (!) 52 17 98 % - -  09/24/15 1955 118/58 mmHg 97.5 F (36.4 C) Oral (!) 51 16 98 % 5\' 5"  (1.651 m) 68.947 kg (152 lb)    1. General:  in No Acute distress 2. Psychological: Alert and   Oriented 3. Head/ENT:   Moist  Mucous Membranes                          Head Non traumatic, neck supple                          Normal Dentition 4. SKIN  decreased Skin turgor,  Skin clean Dry and intact no rash, hematoma present on the back 5. Heart: Regular rate and rhythm no Murmur, Rub or gallop 6. Lungs: some wheezes some crackles   7. Abdomen: Soft, non-tender, Non distended 8. Lower extremities: no clubbing, cyanosis, or edema 9. Neurologically Grossly intact, moving all 4 extremities equally 10. MSK: Normal range of motion 5x7 cm bruise/hematoma over thoracic spine  body mass index is 25.29 kg/(m^2).   Labs on Admission:   Results for orders placed or performed during the hospital encounter of 09/24/15 (from the past 24 hour(s))  Urinalysis, Routine w reflex microscopic (not at [facility])     Status: Abnormal   Collection Time: 09/24/15  8:25 PM  Result Value Ref Range   Color, Urine YELLOW YELLOW   APPearance HAZY (A) CLEAR   Specific Gravity, Urine 1.014 1.005 - 1.030   pH 7.0 5.0 - 8.0   Glucose, UA NEGATIVE NEGATIVE mg/dL   Hgb urine dipstick NEGATIVE NEGATIVE   Bilirubin Urine NEGATIVE NEGATIVE   Ketones, ur NEGATIVE NEGATIVE mg/dL   Protein, ur NEGATIVE NEGATIVE mg/dL   Nitrite NEGATIVE NEGATIVE   Leukocytes, UA SMALL (A) NEGATIVE  Urine microscopic-add on     Status: Abnormal   Collection Time: 09/24/15  8:25 PM  Result Value Ref Range   Squamous Epithelial / LPF 6-30 (A) NONE SEEN   WBC, UA 6-30 0 - 5 WBC/hpf   RBC / HPF 0-5 0 - 5 RBC/hpf   Bacteria, UA FEW (A) NONE SEEN  Comprehensive metabolic panel     Status: Abnormal   Collection Time: 09/24/15  8:29 PM  Result Value  Ref Range   Sodium 125 (L) 135 - 145 mmol/L   Potassium 4.2 3.5 - 5.1 mmol/L   Chloride 94 (L) 101 - 111 mmol/L   CO2 24 22 - 32 mmol/L   Glucose, Bld 146 (H) 65 - 99 mg/dL   BUN 11 6 - 20 mg/dL   Creatinine, Ser 4.09 0.44 - 1.00 mg/dL   Calcium 9.1 8.9 - 81.1 mg/dL   Total Protein 5.6 (L) 6.5 - 8.1 g/dL   Albumin 3.1 (L) 3.5 - 5.0 g/dL   AST 31 15 - 41 U/L   ALT 15 14 - 54 U/L   Alkaline Phosphatase 97 38 - 126 U/L   Total Bilirubin 1.2 0.3 - 1.2 mg/dL   GFR calc non Af Amer 51 (L) >60 mL/min   GFR calc Af Amer 59 (L) >60 mL/min   Anion gap 7 5 - 15  CBC with Differential     Status: Abnormal   Collection Time: 09/24/15  8:29 PM  Result Value Ref Range   WBC 7.5 4.0 - 10.5 K/uL   RBC 3.06 (L) 3.87 - 5.11 MIL/uL   Hemoglobin 9.5 (L) 12.0 - 15.0 g/dL   HCT 91.4 (L) 78.2 - 95.6 %   MCV 93.1 78.0 - 100.0 fL   MCH 31.0 26.0 - 34.0 pg   MCHC 33.3 30.0 - 36.0 g/dL   RDW 21.3 08.6 - 57.8 %   Platelets 183 150 - 400 K/uL   Neutrophils Relative % 80 %   Neutro Abs 6.0 1.7 - 7.7 K/uL   Lymphocytes Relative 9 %   Lymphs Abs 0.7 0.7 - 4.0 K/uL   Monocytes Relative 7 %   Monocytes Absolute 0.6 0.1 - 1.0 K/uL   Eosinophils Relative 4 %   Eosinophils Absolute 0.3 0.0 - 0.7 K/uL   Basophils Relative 0 %   Basophils Absolute 0.0 0.0 - 0.1 K/uL  I-Stat CG4 Lactic Acid, ED  (not at [facility])     Status: None   Collection Time: 09/24/15  8:36 PM  Result Value Ref Range   Lactic Acid, Venous 1.03 0.5 - 2.0 mmol/L  Brain natriuretic peptide     Status: Abnormal   Collection Time: 09/24/15  8:39 PM  Result Value Ref Range   B Natriuretic Peptide 1102.4 (H) 0.0 - 100.0 pg/mL  I-Stat Troponin, ED (not at [facility])     Status: None   Collection Time: 09/24/15 10:06 PM  Result Value Ref Range   Troponin i, poc 0.00 0.00 - 0.08 ng/mL   Comment 3            UA 6-30 white blood cells  No results found for: HGBA1C  Estimated Creatinine Clearance: 42 mL/min (by C-G formula based on Cr of  0.99).  BNP (last 3 results) No results for input(s): PROBNP in the last 8760 hours.  Other results:  I have pearsonaly reviewed this: ECG REPORT  Rate: 56  Rhythm: Left bundle branch block unchanged from prior ST&T Change: N/A QTC 486  Filed Weights   09/24/15 1955  Weight: 68.947 kg (152 lb)     Cultures:    Component Value Date/Time   SDES URINE, CLEAN CATCH 09/07/2012 1111   SPECREQUEST NONE 09/07/2012 1111   CULT  09/07/2012 1111    Multiple bacterial morphotypes present, none predominant. Suggest appropriate recollection if clinically indicated.   REPTSTATUS 09/09/2012 FINAL 09/07/2012 1111     Radiological Exams on Admission: Dg Chest 2 View  09/24/2015  CLINICAL DATA:  Cough and shortness of breath for 1 day EXAM: CHEST  2 VIEW COMPARISON:  12/17/2014 FINDINGS: There is a 7 mm nodular opacity in the right upper lobe. There is bilateral interstitial thickening. There are bilateral small pleural effusions. There is prominence of the central pulmonary vasculature. There is no pneumothorax. The heart and mediastinal contours are unremarkable. There is severe osteoarthritis of the left glenohumeral joint. IMPRESSION: 1. Bilateral interstitial thickening and bilateral pleural effusions concerning for mild pulmonary edema. 2. 7 mm nodular opacity in the right upper lobe. Recommend further evaluation with a nonemergent CT of the chest. Electronically Signed   By: Elige Ko   On: 09/24/2015 20:58    Chart has been reviewed  Family   at  Bedside  plan of care was discussed with Ashling Roane [phone]  Assessment/Plan  81 year old female history of chronic diastolic heart failure hypertension presents syncope and pulmonary edema consistent with acute on chronic heart failure  Present on Admission:  . Acute on chronic diastolic heart failure (HCC)- admit on telemetry, cycle cardiac enzymes, obtain serial ECG, to evaluate for ischemia as a cause of heart failure   monitor daily weight  diurese with IV lasix and monitor orthostatics and creatinine to avoid over diuresis.  Order echogram to evaluate EF and valves Hold off on ACE/ARBi given soft blood pressures Consider cardiology consult . Hyponatremia - in a setting of CHF exacerbation. Administer Lasix fluid restriction follow sodium check urine electrolytes  . Hypertension given somewhat soft blood pressures for now will hold off on hydralazine give holding parameters for medications patient has history of lebile blood pressure  . COPD (chronic obstructive pulmonary disease) (HCC) -  - Will initiate Steroid taper, antibiotics, Albuterol PRN, scheduled Atrovent, Dulera and Mucinex. Titrate O2 to saturation >90%. Follow patients respiratory status.  Initiate COPD GOld . Pulmonary nodule - given history of COPD in the past history of tobacco abuse we will need to investigate. Given pleural effusions will obtain CT of the chest.  . Pre-syncope - patient apparently has fallen down because he thought she felt lightheaded will cycle cardiac enzymes monitored telemetry obtain echogram. Check orthostatics chest x-ray worrisome for pulmonary edema fluid status overall not consistent with generalized fluid overload  . PAF NSR on Flecanide currently appears to be in sinus rhythm with LBBB Recent fall resulting in hematoma - will hold Coumadin for now to avoid progression. Patient denied any head injury. We'll have PT OT evaluation to eval  patient for risk of repeated falls  Prophylaxis:  SCD  CODE STATUS:   DNR/DNI as per patient    Disposition:   To home once workup is complete and patient is stable patient prefers to go home.   Other plan as per orders.  I have spent a total of 55 min on this admission  Karine Garn 09/24/2015, 10:57 PM  [facility]  Pager [phone]   after 2 AM please page floor coverage PA If 7AM-7PM, please contact the day team taking care of the patient  [email or web address]   Password [email or web address]

## 2015-09-24 NOTE — ED Provider Notes (Signed)
CSN: [id number]     Arrival date & time 09/24/15  1946 History   First MD Initiated Contact with Patient 09/24/15 2018     Chief Complaint  Patient presents with  . Fatigue  . Back Pain     (Consider location/radiation/quality/duration/timing/severity/associated sxs/prior Treatment) Patient is a 81 y.o. female presenting with shortness of breath. The history is provided by the patient.  Shortness of Breath Severity:  Moderate Onset quality:  Sudden Duration:  6 hours Timing:  Constant Progression:  Unchanged Chronicity:  New Context: activity   Context comment:  Fall yesterday, felt lightheaded prior to incident, no LOC Relieved by:  Nothing Worsened by:  Nothing tried Ineffective treatments:  None tried Associated symptoms: cough (suddenly today)   Associated symptoms: no chest pain, no fever, no sputum production and no vomiting     Past Medical History  Diagnosis Date  . PAF (paroxysmal atrial fibrillation) (HCC)     NSR on Flecanide  . Hypertension     labile  . High cholesterol   . Macular degeneration   . GERD (gastroesophageal reflux disease)   . TIA (transient ischemic attack)     several in past  . DJD (degenerative joint disease) 10/13    s/p rt THR  . Chronic anticoagulation   . Aortic valve stenosis, mild 11/13  . Ejection fraction   . Diastolic heart failure [facility])    Past Surgical History  Procedure Laterality Date  . Hip arthroplasty  06/30/2012    Procedure: ARTHROPLASTY BIPOLAR HIP;  Surgeon: Shelda Pal, MD;  Location: [facility] OR;  Service: Orthopedics;  Laterality: Right;  right hip hemiarthroplasty  . Carotid duplex  03/20/2012    Bilateral Bulb/Proximal ICAs-mild plaque, mildly abnormal scan  . Cardiovascular stress test  10/13/2010    Normal study, no ECG changes  . Transthoracic echocardiogram  08/25/2012    EF 60-65%, moderately calcified leaflets of the aortic valve, LA moderately dilated,   Family History  Problem Relation Age of Onset   . Emphysema Mother   . Diabetes Father   . Heart attack Father 38  . Stroke Maternal Grandfather   . Heart disease Paternal Grandfather   . Heart disease Paternal Grandmother   . Cancer Son     testicular x3  . Pancreatitis Son   . Diabetes Son     Borderline   Social History  Substance Use Topics  . Smoking status: Never Smoker   . Smokeless tobacco: Never Used  . Alcohol Use: No   OB History    No data available     Review of Systems  Constitutional: Negative for fever.  Respiratory: Positive for cough (suddenly today) and shortness of breath. Negative for sputum production.   Cardiovascular: Negative for chest pain.  Gastrointestinal: Negative for vomiting.  All other systems reviewed and are negative.     Allergies  Chlorthalidone; Sulfa antibiotics; Vioxx; Celebrex; Clarithromycin; Statins; Other; and Penicillins  Home Medications   Prior to Admission medications   Medication Sig Start Date End Date Taking? Authorizing Provider  CALCIUM PO Take 1 tablet by mouth 3 (three) times daily.    Yes Historical Provider, MD  carvedilol (COREG) 12.5 MG tablet Take 12.5 mg by mouth 2 (two) times daily with a meal.    Yes Historical Provider, MD  fish oil-omega-3 fatty acids 1000 MG capsule Take 1 g by mouth daily.   Yes Historical Provider, MD  flecainide (TAMBOCOR) 100 MG tablet Take 1 tablet (100 mg total)  by mouth 2 (two) times daily. 02/10/15  Yes Runell GessJonathan J Berry, MD  furosemide (LASIX) 20 MG tablet Take 1 tablet (20 mg total) by mouth daily as needed. For weight gain of 3 pounds or more. 05/30/14  Yes Ryan M Dunn, PA-C  hydrALAZINE (APRESOLINE) 25 MG tablet Take 1 tablet by mouth 2 (two) times daily.  01/19/15  Yes Historical Provider, MD  lansoprazole (PREVACID) 15 MG capsule Take 15 mg by mouth daily as needed (reflux).    Yes Historical Provider, MD  Multiple Vitamin (MULTIVITAMIN WITH MINERALS) TABS tablet Take 1 tablet by mouth daily.   Yes Historical Provider, MD   Multiple Vitamins-Minerals (ICAPS AREDS 2) CAPS Take 2 capsules by mouth 2 (two) times daily. WITH LUTEIN AND ZEAXANTHIN   Yes Historical Provider, MD  nitrofurantoin (MACRODANTIN) 100 MG capsule Take 100 mg by mouth daily.  12/17/13  Yes Historical Provider, MD  nitroGLYCERIN (NITROSTAT) 0.4 MG SL tablet Place 1 tablet (0.4 mg total) under the tongue every 5 (five) minutes as needed for chest pain. 05/30/14  Yes Ryan M Dunn, PA-C  Pitavastatin Calcium (LIVALO) 1 MG TABS Take 0.5 tablets (0.5 mg total) by mouth daily. 09/10/15  Yes Runell GessJonathan J Berry, MD  tiotropium (SPIRIVA) 18 MCG inhalation capsule Place 18 mcg into inhaler and inhale daily.   Yes Historical Provider, MD  warfarin (COUMADIN) 2 MG tablet Take 4 mg by mouth See admin instructions. TAKES 4MG  ONCE WEEKLY ON THURS ONLY   Yes Historical Provider, MD   BP 123/77 mmHg  Pulse 55  Temp(Src) 97.5 F (36.4 C) (Oral)  Resp 23  Ht 5\' 5"  (1.651 m)  Wt 152 lb (68.947 kg)  BMI 25.29 kg/m2  SpO2 98% Physical Exam  Constitutional: She is oriented to person, place, and time. She appears well-developed and well-nourished. No distress.  HENT:  Head: Normocephalic.  Eyes: Conjunctivae are normal.  Neck: Neck supple. No tracheal deviation present.  Cardiovascular: Normal rate, regular rhythm and normal heart sounds.   Pulmonary/Chest: Effort normal. No respiratory distress. She has wheezes. She has rales (diffusely biillateral fields).  Abdominal: Soft. She exhibits no distension.  Neurological: She is alert and oriented to person, place, and time.  Skin: Skin is warm and dry.  Psychiatric: She has a normal mood and affect.    ED Course  Procedures (including critical care time) Labs Review Labs Reviewed  COMPREHENSIVE METABOLIC PANEL - Abnormal; Notable for the following:    Sodium 125 (*)    Chloride 94 (*)    Glucose, Bld 146 (*)    Total Protein 5.6 (*)    Albumin 3.1 (*)    GFR calc non Af Amer 51 (*)    GFR calc Af Amer 59 (*)     All other components within normal limits  URINALYSIS, ROUTINE W REFLEX MICROSCOPIC (NOT AT [facility]) - Abnormal; Notable for the following:    APPearance HAZY (*)    Leukocytes, UA SMALL (*)    All other components within normal limits  CBC WITH DIFFERENTIAL/PLATELET - Abnormal; Notable for the following:    RBC 3.06 (*)    Hemoglobin 9.5 (*)    HCT 28.5 (*)    All other components within normal limits  URINE MICROSCOPIC-ADD ON - Abnormal; Notable for the following:    Squamous Epithelial / LPF 6-30 (*)    Bacteria, UA FEW (*)    All other components within normal limits  BRAIN NATRIURETIC PEPTIDE - Abnormal; Notable for the following:  B Natriuretic Peptide 1102.4 (*)    All other components within normal limits  URINE CULTURE  I-STAT CG4 LACTIC ACID, ED  I-STAT TROPOININ, ED  I-STAT CG4 LACTIC ACID, ED    Imaging Review Dg Chest 2 View  09/24/2015  CLINICAL DATA:  Cough and shortness of breath for 1 day EXAM: CHEST  2 VIEW COMPARISON:  12/17/2014 FINDINGS: There is a 7 mm nodular opacity in the right upper lobe. There is bilateral interstitial thickening. There are bilateral small pleural effusions. There is prominence of the central pulmonary vasculature. There is no pneumothorax. The heart and mediastinal contours are unremarkable. There is severe osteoarthritis of the left glenohumeral joint. IMPRESSION: 1. Bilateral interstitial thickening and bilateral pleural effusions concerning for mild pulmonary edema. 2. 7 mm nodular opacity in the right upper lobe. Recommend further evaluation with a nonemergent CT of the chest. Electronically Signed   By: Elige Ko   On: 09/24/2015 20:58   I have personally reviewed and evaluated these images and lab results as part of my medical decision-making.   EKG Interpretation   Date/Time:  Wednesday September 24 2015 20:03:17 EST Ventricular Rate:  56 PR Interval:  281 QRS Duration: 146 QT Interval:  504 QTC Calculation: 486 R Axis:    80 Text Interpretation:  Sinus rhythm Prolonged PR interval Left bundle  branch block No significant change since last tracing Confirmed by Adrion Menz  MD, Reuel Boom ([id number]) on 09/24/2015 8:18:00 PM      MDM   Final diagnoses:  Acute on chronic congestive heart failure, unspecified congestive heart failure type [facility])    81 y.o. female presents with presyncopal episode yesterday where she felt lightheaded and fell backwards onto her back. She has a history of diastolic heart failure and intermittent atrial fibrillation that is rhythm controlled with flecainide. She presents today because she had acute onset of some shortness of breath, cough, and appears to be having a heart failure exacerbation clinically. No significant ischemic findings on EKG, pulmonary edema evident on chest x-ray, BNP elevated to greater than 1100. Cardiology consulted to recommended medical admission for further workup and management. Diuresis started with 40 mg IV Lasix. Hospitalist was consulted for admission and will see the patient in the emergency department.     Lyndal Pulley, MD 09/24/15 [phone]

## 2015-09-24 NOTE — ED Notes (Signed)
Knott, MD at bedside.  

## 2015-09-24 NOTE — ED Notes (Signed)
Per EMS, pt has not been feeling well x 5 hours. Audible rhonchi, with a weak, moist cough. Pt was seen at PCP yesterday after falling on her back. Pt c/o back pain. Pt states that her temp normally runs in the 96 range. Pt also reporting tingling in bilateral legs.

## 2015-09-25 ENCOUNTER — Inpatient Hospital Stay (HOSPITAL_COMMUNITY): Payer: Medicare Other

## 2015-09-25 ENCOUNTER — Encounter (HOSPITAL_COMMUNITY): Payer: Self-pay | Admitting: *Deleted

## 2015-09-25 ENCOUNTER — Ambulatory Visit (HOSPITAL_COMMUNITY): Payer: Medicare Other

## 2015-09-25 DIAGNOSIS — I480 Paroxysmal atrial fibrillation: Secondary | ICD-10-CM

## 2015-09-25 DIAGNOSIS — I509 Heart failure, unspecified: Secondary | ICD-10-CM

## 2015-09-25 DIAGNOSIS — I5032 Chronic diastolic (congestive) heart failure: Secondary | ICD-10-CM

## 2015-09-25 DIAGNOSIS — J438 Other emphysema: Secondary | ICD-10-CM

## 2015-09-25 DIAGNOSIS — M25539 Pain in unspecified wrist: Secondary | ICD-10-CM | POA: Insufficient documentation

## 2015-09-25 DIAGNOSIS — I951 Orthostatic hypotension: Secondary | ICD-10-CM

## 2015-09-25 LAB — BASIC METABOLIC PANEL
Anion gap: 9 (ref 5–15)
BUN: 11 mg/dL (ref 6–20)
CHLORIDE: 92 mmol/L — AB (ref 101–111)
CO2: 25 mmol/L (ref 22–32)
CREATININE: 1.03 mg/dL — AB (ref 0.44–1.00)
Calcium: 9.4 mg/dL (ref 8.9–10.3)
GFR calc Af Amer: 57 mL/min — ABNORMAL LOW (ref >60)
GFR calc non Af Amer: 49 mL/min — ABNORMAL LOW (ref >60)
GLUCOSE: 124 mg/dL — AB (ref 65–99)
Potassium: 4.3 mmol/L (ref 3.5–5.1)
Sodium: 126 mmol/L — ABNORMAL LOW (ref 135–145)

## 2015-09-25 LAB — D-DIMER, QUANTITATIVE: D-Dimer, Quant: 2.33 ug/mL-FEU — ABNORMAL HIGH (ref 0.00–0.50)

## 2015-09-25 LAB — CREATININE, URINE, RANDOM: Creatinine, Urine: 20.04 mg/dL

## 2015-09-25 LAB — PROTIME-INR
INR: 8.22 (ref 0.00–1.49)
Prothrombin Time: 65.3 seconds — ABNORMAL HIGH (ref 11.6–15.2)

## 2015-09-25 LAB — TROPONIN I
Troponin I: <0.03 ng/mL (ref <0.031)
Troponin I: <0.03 ng/mL (ref <0.031)

## 2015-09-25 LAB — SODIUM, URINE, RANDOM: SODIUM UR: 76 mmol/L

## 2015-09-25 LAB — OSMOLALITY, URINE: OSMOLALITY UR: 249 mosm/kg — AB (ref 300–900)

## 2015-09-25 LAB — OSMOLALITY: Osmolality: 269 mOsm/kg — ABNORMAL LOW (ref 275–295)

## 2015-09-25 LAB — TSH: TSH: 1.34 u[IU]/mL (ref 0.350–4.500)

## 2015-09-25 LAB — PREALBUMIN: Prealbumin: 21.4 mg/dL (ref 18–38)

## 2015-09-25 MED ORDER — CARVEDILOL 25 MG PO TABS
25.0000 mg | ORAL_TABLET | Freq: Two times a day (BID) | ORAL | Status: DC
  Administered 2015-09-25: 25 mg via ORAL
  Filled 2015-09-25: qty 1

## 2015-09-25 MED ORDER — PREDNISONE 20 MG PO TABS
40.0000 mg | ORAL_TABLET | Freq: Two times a day (BID) | ORAL | Status: DC
  Administered 2015-09-25: 40 mg via ORAL
  Filled 2015-09-25 (×2): qty 2

## 2015-09-25 MED ORDER — ONDANSETRON HCL 4 MG/2ML IJ SOLN: 4.0000 mg | Freq: Four times a day (QID) | INTRAMUSCULAR | Status: DC | PRN

## 2015-09-25 MED ORDER — FUROSEMIDE 10 MG/ML IJ SOLN
40.0000 mg | Freq: Every day | INTRAMUSCULAR | Status: DC
  Administered 2015-09-25: 40 mg via INTRAVENOUS
  Filled 2015-09-25: qty 4

## 2015-09-25 MED ORDER — SODIUM CHLORIDE 0.9 % IV SOLN: 250.0000 mL | INTRAVENOUS | Status: DC | PRN

## 2015-09-25 MED ORDER — FLECAINIDE ACETATE 100 MG PO TABS
100.0000 mg | ORAL_TABLET | Freq: Two times a day (BID) | ORAL | Status: DC
  Administered 2015-09-25 – 2015-09-30 (×11): 100 mg via ORAL
  Filled 2015-09-25 (×13): qty 1

## 2015-09-25 MED ORDER — IPRATROPIUM BROMIDE 0.02 % IN SOLN
0.5000 mg | Freq: Four times a day (QID) | RESPIRATORY_TRACT | Status: DC
  Administered 2015-09-25: 0.5 mg via RESPIRATORY_TRACT
  Filled 2015-09-25: qty 2.5

## 2015-09-25 MED ORDER — ALBUTEROL SULFATE (2.5 MG/3ML) 0.083% IN NEBU
2.5000 mg | INHALATION_SOLUTION | RESPIRATORY_TRACT | Status: DC | PRN
Start: 2015-09-25 — End: 2015-09-30

## 2015-09-25 MED ORDER — PRAVASTATIN SODIUM 20 MG PO TABS
20.0000 mg | ORAL_TABLET | Freq: Every day | ORAL | Status: DC
  Administered 2015-09-25 – 2015-09-29 (×5): 20 mg via ORAL
  Filled 2015-09-25 (×5): qty 1

## 2015-09-25 MED ORDER — CARVEDILOL 12.5 MG PO TABS
12.5000 mg | ORAL_TABLET | Freq: Two times a day (BID) | ORAL | Status: DC
  Administered 2015-09-25 – 2015-09-26 (×2): 12.5 mg via ORAL
  Filled 2015-09-25 (×2): qty 1

## 2015-09-25 MED ORDER — ACETAMINOPHEN 325 MG PO TABS
650.0000 mg | ORAL_TABLET | ORAL | Status: DC | PRN
  Administered 2015-09-25 – 2015-09-30 (×13): 650 mg via ORAL
  Filled 2015-09-25 (×13): qty 2

## 2015-09-25 MED ORDER — IOHEXOL 350 MG/ML SOLN
100.0000 mL | Freq: Once | INTRAVENOUS | Status: AC | PRN
  Administered 2015-09-25: 100 mL via INTRAVENOUS

## 2015-09-25 MED ORDER — ADULT MULTIVITAMIN W/MINERALS CH
1.0000 | ORAL_TABLET | Freq: Every day | ORAL | Status: DC
  Administered 2015-09-25 – 2015-09-30 (×6): 1 via ORAL
  Filled 2015-09-25 (×6): qty 1

## 2015-09-25 MED ORDER — TIOTROPIUM BROMIDE MONOHYDRATE 18 MCG IN CAPS
18.0000 ug | ORAL_CAPSULE | Freq: Every day | RESPIRATORY_TRACT | Status: DC
  Administered 2015-09-26 – 2015-09-30 (×5): 18 ug via RESPIRATORY_TRACT
  Filled 2015-09-25: qty 5

## 2015-09-25 MED ORDER — POTASSIUM CHLORIDE CRYS ER 10 MEQ PO TBCR
10.0000 meq | EXTENDED_RELEASE_TABLET | Freq: Two times a day (BID) | ORAL | Status: DC
  Administered 2015-09-25 (×2): 10 meq via ORAL
  Filled 2015-09-25 (×2): qty 1

## 2015-09-25 MED ORDER — GUAIFENESIN ER 600 MG PO TB12
600.0000 mg | ORAL_TABLET | Freq: Two times a day (BID) | ORAL | Status: DC
  Administered 2015-09-25 – 2015-09-30 (×12): 600 mg via ORAL
  Filled 2015-09-25 (×12): qty 1

## 2015-09-25 MED ORDER — SODIUM CHLORIDE 0.9 % IJ SOLN
3.0000 mL | Freq: Two times a day (BID) | INTRAMUSCULAR | Status: DC
  Administered 2015-09-25 – 2015-09-30 (×12): 3 mL via INTRAVENOUS

## 2015-09-25 MED ORDER — MOMETASONE FURO-FORMOTEROL FUM 100-5 MCG/ACT IN AERO
2.0000 | INHALATION_SPRAY | Freq: Two times a day (BID) | RESPIRATORY_TRACT | Status: DC
  Administered 2015-09-26 – 2015-09-30 (×9): 2 via RESPIRATORY_TRACT
  Filled 2015-09-25 (×3): qty 8.8

## 2015-09-25 MED ORDER — IPRATROPIUM BROMIDE 0.02 % IN SOLN: 0.5000 mg | RESPIRATORY_TRACT | Status: DC | PRN

## 2015-09-25 MED ORDER — DOXYCYCLINE HYCLATE 100 MG PO TABS
100.0000 mg | ORAL_TABLET | Freq: Two times a day (BID) | ORAL | Status: DC
  Administered 2015-09-25 (×2): 100 mg via ORAL
  Filled 2015-09-25 (×2): qty 1

## 2015-09-25 MED ORDER — VITAMIN K1 10 MG/ML IJ SOLN
4.0000 mg | Freq: Once | INTRAMUSCULAR | Status: AC
  Administered 2015-09-25: 4 mg via SUBCUTANEOUS
  Filled 2015-09-25: qty 2

## 2015-09-25 MED ORDER — SODIUM CHLORIDE 0.9 % IJ SOLN: 3.0000 mL | INTRAMUSCULAR | Status: DC | PRN

## 2015-09-25 MED ORDER — NITROFURANTOIN MACROCRYSTAL 100 MG PO CAPS
100.0000 mg | ORAL_CAPSULE | Freq: Every day | ORAL | Status: DC
  Administered 2015-09-25 – 2015-09-30 (×6): 100 mg via ORAL
  Filled 2015-09-25 (×6): qty 1

## 2015-09-25 NOTE — Progress Notes (Signed)
Patient transferred from the ED to room 3E09. Oriented patient to the room and room equipment. Charge nurse notified CMT of placement of telemetry leads and box number and assigned nurse called and did 2nd verification. Currently patient complains of some discomfort in her back due to her fall at home. During assessment, notice large hematoma/bruise noted on mid area back which is where patient states she fell and also states where her back is tender. Also noted there is a bruise noted on her right buttocks from the fall. Will give PRN tylenol and continue monitoring patient to the end of shift.

## 2015-09-25 NOTE — Progress Notes (Signed)
Initial Nutrition Assessment  INTERVENTION:  Encourage PO intake Provide snacks BID Provide Multivitamin with minerals daily   NUTRITION DIAGNOSIS:   Inadequate oral intake related to poor appetite as evidenced by per patient/family report, energy intake < or equal to 50% for > or equal to 5 days.   GOAL:   Patient will meet greater than or equal to 90% of their needs   MONITOR:   PO intake, Labs, Skin, Weight trends, I & O's  REASON FOR ASSESSMENT:   Consult Assessment of nutrition requirement/status  ASSESSMENT:   81 year old female with a history of paroxysmal atrial fibrillation on warfarin, hypertension, hyperlipidemia, diastolic CHF, TIA presented with sudden onset of shortness of breath and chest congestion on the day of admission. Further questioning reveals that the patient has had a cough for the past 7 days with 2 days of shortness of breath.   Pt reports having a poor appetite for the past week due to being sick; states she has only been eating a bowl of oatmeal and a banana daily. Usually she has a good appetite and eats well. Per weight history, pt has lost 8% of her body weight in the past 2 weeks although weight is down 7 lbs from yesterdays weight. Per nursing notes, pt is eating 25-50% of meals. RD emphasized the importance of adequate nutrient intake, offered supplements and snacks. Pt is agreeable to receiving snacks.   Labs: low sodium, low chloride, low hemoglobin  Diet Order:  Diet Heart Room service appropriate?: Yes; Fluid consistency:: Thin; Fluid restriction:: 1500 mL Fluid  Skin:  Reviewed, no issues  Last BM:  12/21  Height:   Ht Readings from Last 1 Encounters:  09/25/15 5\' 5"  (1.651 m)    Weight:   Wt Readings from Last 1 Encounters:  09/25/15 145 lb 8.1 oz (66 kg)    Ideal Body Weight:  56.8 kg  BMI:  Body mass index is 24.21 kg/(m^2).  Estimated Nutritional Needs:   Kcal:  1600-1800  Protein:  90-100 grams  Fluid:   1.6-1.8 L/day  EDUCATION NEEDS:   No education needs identified at this time  Dorothea Ogleeanne Trayshawn Durkin RD, LDN Inpatient Clinical Dietitian Pager: [phone] After Hours Pager: [phone]

## 2015-09-25 NOTE — Evaluation (Signed)
Physical Therapy Evaluation Patient Details Name: Madeline Serrano MRN: [id number] DOB: [birth year] Today's Date: 09/25/2015   History of Present Illness  Patient is an 81 y/o female with  past medical history of PAF (paroxysmal atrial fibrillation) (HCC); Hypertension; High cholesterol; Macular degeneration; GERD (gastroesophageal reflux disease); TIA (transient ischemic attack); DJD (degenerative joint disease) (10/13); Chronic anticoagulation; Aortic valve stenosis, mild (11/13); Ejection fraction; and Diastolic heart failure (HCC).  Reports feeling short of breath at baseline due to COPD. She has no been feeling well. She was supposed to have a PCP appointment and fell as was trying to get ready because she felt lightheaded.  Admitted with CHF exacerbation.  Clinical Impression  Patient presents with decreased mobility due to deficits listed in PT problem list and will benefit from skilled PT in the acute setting.  Main issues are pain and h/o orthostatic hypotension with continued difficult control and on anticoagulation therapy with elevated INR on admission.  Patient currently with intermittent, but daily help available at home.  Spoke with son who reports he has planned trip with his children after Christmas.  Feel patient will need 24/7 assist at least initially upon d/c.  Son reports he would consider SNF, but not sure patient will.  If family can't provide 24 hour assist, would need SNF for safest d/c plan in the short term.    Follow Up Recommendations Home health PT;Supervision/Assistance - 24 hour    Equipment Recommendations  None recommended by PT    Recommendations for Other Services       Precautions / Restrictions Precautions Precautions: Fall Restrictions Weight Bearing Restrictions: No      Mobility  Bed Mobility Overal bed mobility: Needs Assistance Bed Mobility: Rolling;Sidelying to Sit Rolling: Supervision Sidelying to sit: Min assist       General bed mobility  comments: cues for technique, use of rail to roll and assist for trunk to elevate due to pain in back  Transfers Overall transfer level: Needs assistance Equipment used: Rolling walker (2 wheeled) Transfers: Sit to/from Stand Sit to Stand: Min guard         General transfer comment: for balance and cues for hand placement  Ambulation/Gait Ambulation/Gait assistance: Min guard Ambulation Distance (Feet): 125 Feet Assistive device: Rolling walker (2 wheeled) Gait Pattern/deviations: Step-through pattern;Decreased stride length;Trunk flexed     General Gait Details: flexed and stiff due to back pain; assist for steadying, but pt able to turn with walker in hallway without difficulty  Stairs            Wheelchair Mobility    Modified Rankin (Stroke Patients Only)       Balance Overall balance assessment: Needs assistance         Standing balance support: Bilateral upper extremity supported Standing balance-Leahy Scale: Poor Standing balance comment: balances with UE support                             Pertinent Vitals/Pain Pain Assessment: Faces Faces Pain Scale: Hurts even more Pain Location: back with movement Pain Descriptors / Indicators: Grimacing;Sore Pain Intervention(s): Repositioned;Ice applied;Monitored during session    Home Living Family/patient expects to be discharged to:: Private residence Living Arrangements: Children Available Help at Discharge: Personal care attendant;Available PRN/intermittently;Family Type of Home: House Home Access: Stairs to enter Entrance Stairs-Rails: None Entrance Stairs-Number of Steps: 2 Home Layout: One level Home Equipment: Walker - 2 wheels;Cane - single point;Bedside commode;Shower seat;Grab bars -  tub/shower      Prior Function Level of Independence: Independent with assistive device(s)         Comments: aide two days a week four hours; son hs been staying with patient, and was in and out,  but has planned trip with family after Christmas     Hand Dominance   Dominant Hand: Right    Extremity/Trunk Assessment   Upper Extremity Assessment: Defer to OT evaluation           Lower Extremity Assessment: Generalized weakness      Cervical / Trunk Assessment: Kyphotic  Communication   Communication: HOH (son utilized app on his phone to amplify with ear buds)  Cognition Arousal/Alertness: Awake/alert Behavior During Therapy: WFL for tasks assessed/performed Overall Cognitive Status: History of cognitive impairments - at baseline (awakened when PT came in and initially disoriented, son reports is normal for her)                      General Comments General comments (skin integrity, edema, etc.): bruise and knot on L posterior trunk    Exercises        Assessment/Plan    PT Assessment Patient needs continued PT services  PT Diagnosis Generalized weakness   PT Problem List Decreased balance;Decreased mobility;Pain;Decreased knowledge of use of DME;Decreased activity tolerance  PT Treatment Interventions DME instruction;Gait training;Balance training;Stair training;Functional mobility training;Patient/family education;Therapeutic activities;Therapeutic exercise   PT Goals (Current goals can be found in the Care Plan section) Acute Rehab PT Goals Patient Stated Goal: To return to independent PT Goal Formulation: With patient/family Time For Goal Achievement: 10/02/15 Potential to Achieve Goals: Good    Frequency Min 3X/week   Barriers to discharge Decreased caregiver support son currently staying with pt but has planned trip with family after Christmas    Co-evaluation               End of Session Equipment Utilized During Treatment: Gait belt Activity Tolerance: Patient tolerated treatment well Patient left: in chair;with family/visitor present;with call bell/phone within reach           Time: 1040-1104 PT Time Calculation (min)  (ACUTE ONLY): 24 min   Charges:   PT Evaluation $Initial PT Evaluation Tier I: 1 Procedure PT Treatments $Gait Training: 8-22 mins   PT G CodesElray Mcgregor 10/14/15, 1:12 PM  Sheran Lawless, PT [phone] October 14, 2015

## 2015-09-25 NOTE — Progress Notes (Signed)
  Echocardiogram 2D Echocardiogram has been performed.  Nolon RodBrown, Tony 09/25/2015, 2:49 PM

## 2015-09-25 NOTE — ED Notes (Signed)
Patient transported to CT 

## 2015-09-25 NOTE — Progress Notes (Signed)
PROGRESS NOTE  Madeline Serrano ZOX:[id number] DOB: 10/16/1929 DOA: 09/24/2015 PCP: Martha ClanShaw, William, MD  Brief history 81 year old female with a history of paroxysmal atrial fibrillation on warfarin, hypertension, hyperlipidemia, diastolic CHF, TIA presented with sudden onset of shortness of breath and chest congestion on the day of admission. Further questioning reveals that the patient has had a cough for the past 7 days with 2 days of shortness of breath. She denies any orthopnea or PND type symptoms. She denies any recent weight gain. Her weight has been stable, and the patient has been compliant with all her medications. She denies any new medications. One day prior to admission, the patient felt lightheaded as she was getting up and trying to put on close and resulted in a mechanical fall without syncope. Review of the medical record reveals that the patient hasn't had a history of orthostatic hypotension. She denies any fevers, chills, chest pain, headache, neck pain, rashes, nausea, vomiting, diarrhea, abdominal pain, dysuria, hematuria. After admission, the patient was given prednisone and intravenous furosemide. CT angiogram of the chest reveals a left posterior chest wall contusion, but was negative for pulmonary embolus.  Assessment/Plan: Dyspnea in the setting of COPD and chronic diastolic CHF -Suspect the patient had a recent viral URI syndrome -Also concerned with possible aspiration event given the acuity of her symptoms and CT of the chest revealing airway debris layering in the mainstem bronchus. -Although there were small bilateral pleural effusions, the patient appears clinically euvolemic -Repeat echocardiogram -Discontinue antibiotics except for her maintenance nitrofurantoin -Discontinue furosemide -Daily weights -Monitor clinically Supratherapeutic INR -There is concern for the patient mixing up on her warfarin dosing -Vitamin K was given -The patient's mechanical  fall has resulted in a large posterior chest wall hematoma -I have discussed the risks and benefits of continuing warfarin in the setting of her falls--she wishes to continue -Recheck INR Paroxysmal atrial fibrillation -CHADSVASc-6 -Continue flecainide -Hold warfarin for now Orthostatic hypotension -This has been a chronic problem for the patient -She will have to tolerate a higher blood pressure at rest -Discontinue hydralazine -Reduced dose of carvedilol Hyponatremia -This has been chronic with a range from 127-131 -I suspect this is probably due to poor solute intake -Unfortunately, urine sodium was obtained after the patient was given furosemide -Check serum osmolarity, urine osmolarity COPD -Clinically compensated -Continue Spiriva Gait instability -PT evaluation-->HHPT -OT eval Pulmonary nodule -Incidental finding on CT chest -needs outpatient surveillance  Family Communication:   Son updated at beside Disposition Plan:   Home 1-2 days--total time 6065 min--1240-1345       Procedures/Studies: Dg Chest 2 View  09/24/2015  CLINICAL DATA:  Cough and shortness of breath for 1 day EXAM: CHEST  2 VIEW COMPARISON:  12/17/2014 FINDINGS: There is a 7 mm nodular opacity in the right upper lobe. There is bilateral interstitial thickening. There are bilateral small pleural effusions. There is prominence of the central pulmonary vasculature. There is no pneumothorax. The heart and mediastinal contours are unremarkable. There is severe osteoarthritis of the left glenohumeral joint. IMPRESSION: 1. Bilateral interstitial thickening and bilateral pleural effusions concerning for mild pulmonary edema. 2. 7 mm nodular opacity in the right upper lobe. Recommend further evaluation with a nonemergent CT of the chest. Electronically Signed   By: Elige KoHetal  Patel   On: 09/24/2015 20:58   Dg Wrist 2 Views Right  09/25/2015  CLINICAL DATA:  Wrist pain after fall.  Initial encounter. EXAM: RIGHT WRIST  -  2 VIEW COMPARISON:  12/17/2014 FINDINGS: No evidence of acute fracture or dislocation. Advanced first CMC osteoarthritis with subluxation, fragmentation, and metacarpal base cystic change. Osteopenia and chondrocalcinosis. IMPRESSION: No acute finding. Electronically Signed   By: Marnee Spring M.D.   On: 09/25/2015 01:03   Ct Angio Chest Pe W/cm &/or Wo Cm  09/25/2015  CLINICAL DATA:  Dyspnea.  Fall today with back pain EXAM: CT ANGIOGRAPHY CHEST WITH CONTRAST TECHNIQUE: Multidetector CT imaging of the chest was performed using the standard protocol during bolus administration of intravenous contrast. Multiplanar CT image reconstructions and MIPs were obtained to evaluate the vascular anatomy. CONTRAST:  OMNIPAQUE IOHEXOL 350 MG/ML SOLN COMPARISON:  None. FINDINGS: THORACIC INLET/BODY WALL: Subcutaneous reticulation along the left posterior chest wall compatible with contusion. 1 cm right thyroid nodule which is considered incidental. MEDIASTINUM: Normal heart size. No pericardial effusion. No evidence of acute vascular abnormality, including pulmonary embolism or aortic dissection. Extensive atherosclerosis. No adenopathy. LUNG WINDOWS: Mosaic attenuation lungs which is likely air trapping. There is extensive airways debris with layering in the bilateral mainstem bronchus. Dependent atelectasis with small layering effusions. Spiculated 8 mm pulmonary nodule in the left upper lobe. UPPER ABDOMEN: No acute findings. OSSEOUS: Severe glenohumeral osteoarthritis on the left with joint effusion and secondary osteochondromatosis. Review of the MIP images confirms the above findings. IMPRESSION: 1. No evidence of pulmonary embolism. 2. Partly visualized left posterior chest wall contusion. 3. Extensive airway debris, question aspiration. 4. Small bilateral pleural effusion and mild atelectasis. 5. 8 mm spiculated nodule in the left upper lobe. Follow-up chest CT at 3-60months is recommended. Electronically  Signed   By: Marnee Spring M.D.   On: 09/25/2015 02:06        Subjective: Patient denies fevers, chills, headache, chest pain, dyspnea, nausea, vomiting, diarrhea, abdominal pain, dysuria, hematuria   Objective: Filed Vitals:   09/25/15 0849 09/25/15 1107 09/25/15 1110 09/25/15 1116  BP:  152/50 145/51 110/61  Pulse:  59 61 62  Temp:  98.8 F (37.1 C)    TempSrc:  Oral    Resp:      Height:      Weight:      SpO2: 96% 96%      Intake/Output Summary (Last 24 hours) at 09/25/15 1347 Last data filed at 09/25/15 1335  Gross per 24 hour  Intake    603 ml  Output   1256 ml  Net   -653 ml   Weight change:  Exam:   General:  Pt is alert, follows commands appropriately, not in acute distress  HEENT: No icterus, No thrush, No neck mass, [place]/AT  Cardiovascular: RRR, S1/S2, no rubs, no gallops  Respiratory: CTA bilaterally, no wheezing, no crackles, no rhonchi  Abdomen: Soft/+BS, non tender, non distended, no guarding; left posterior flank with large hematoma measuring approximately 10 cm in diameter; no hepatosplenomegaly  Extremities: No edema, No lymphangitis, No petechiae, No rashes, no synovitis; no cyanosis or clubbing  Data Reviewed: Basic Metabolic Panel:  Recent Labs Lab 09/24/15 2029 09/25/15 0850  NA 125* 126*  K 4.2 4.3  CL 94* 92*  CO2 24 25  GLUCOSE 146* 124*  BUN 11 11  CREATININE 0.99 1.03*  CALCIUM 9.1 9.4   Liver Function Tests:  Recent Labs Lab 09/24/15 2029  AST 31  ALT 15  ALKPHOS 97  BILITOT 1.2  PROT 5.6*  ALBUMIN 3.1*   No results for input(s): LIPASE, AMYLASE in the last 168 hours. No results for input(s):  AMMONIA in the last 168 hours. CBC:  Recent Labs Lab 09/24/15 2029  WBC 7.5  NEUTROABS 6.0  HGB 9.5*  HCT 28.5*  MCV 93.1  PLT 183   Cardiac Enzymes:  Recent Labs Lab 09/25/15 0039 09/25/15 0850  TROPONINI <0.03 <0.03   BNP: Invalid input(s): POCBNP CBG: No results for input(s): GLUCAP in the last 168  hours.  Recent Results (from the past 240 hour(s))  Urine culture     Status: None (Preliminary result)   Collection Time: 09/24/15  8:25 PM  Result Value Ref Range Status   Specimen Description URINE, CLEAN CATCH  Final   Special Requests NONE  Final   Culture TOO YOUNG TO READ  Final   Report Status PENDING  Incomplete     Scheduled Meds: . carvedilol  12.5 mg Oral BID WC  . flecainide  100 mg Oral BID  . guaiFENesin  600 mg Oral BID  . mometasone-formoterol  2 puff Inhalation BID  . nitrofurantoin  100 mg Oral Daily  . pravastatin  20 mg Oral q1800  . sodium chloride  3 mL Intravenous Q12H  . tiotropium  18 mcg Inhalation Daily   Continuous Infusions:    Meygan Kyser, DO  [facility] Pager [phone]  If 7PM-7AM, please contact night-coverage [email or web address] Password [email or web address] 09/25/2015, 1:47 PM   LOS: 1 day

## 2015-09-25 NOTE — Evaluation (Signed)
Occupational Therapy Evaluation Patient Details Name: Madeline Serrano MRN: [id number] DOB: Oct 24, 1929 Today's Date: 09/25/2015    History of Present Illness Patient is an 81 y/o female with  past medical history of PAF (paroxysmal atrial fibrillation) (HCC); Hypertension; High cholesterol; Macular degeneration; GERD (gastroesophageal reflux disease); TIA (transient ischemic attack); DJD (degenerative joint disease) (10/13); Chronic anticoagulation; Aortic valve stenosis, mild (11/13); Ejection fraction; and Diastolic heart failure (HCC).  Reports feeling short of breath at baseline due to COPD. She has no been feeling well. She was supposed to have a PCP appointment and fell as was trying to get ready because she felt lightheaded.  Admitted with CHF exacerbation.   Clinical Impression   Pt admitted with above. She demonstrates the below listed deficits and will benefit from continued OT to maximize safety and independence with BADLs.  Pt presents to OT with acute pain and generalized weakness.  She requires mod A for LB ADLs due to pain.   She has h/o falls (4 in 4 years).  Recommend 24 hour supervision at discharge.  Will follow acutely.       Follow Up Recommendations  Home health OT;Supervision/Assistance - 24 hour    Equipment Recommendations  3 in 1 bedside comode    Recommendations for Other Services       Precautions / Restrictions Precautions Precautions: Fall Restrictions Weight Bearing Restrictions: No      Mobility Bed Mobility Overal bed mobility: Needs Assistance Bed Mobility: Sit to Supine       Sit to supine: Min assist   General bed mobility comments: assist for LEs due to back pain   Transfers Overall transfer level: Needs assistance Equipment used: Rolling walker (2 wheeled) Transfers: Sit to/from UGI CorporationStand;Stand Pivot Transfers Sit to Stand: Min guard Stand pivot transfers: Min guard            Balance Overall balance assessment: Needs  assistance Sitting-balance support: Feet supported Sitting balance-Leahy Scale: Fair     Standing balance support: Bilateral upper extremity supported Standing balance-Leahy Scale: Poor                              ADL Overall ADL's : Needs assistance/impaired Eating/Feeding: Independent   Grooming: Wash/dry hands;Wash/dry face;Oral care;Brushing hair;Set up;Sitting   Upper Body Bathing: Set up;Sitting   Lower Body Bathing: Moderate assistance;Sit to/from stand   Upper Body Dressing : Set up;Sitting   Lower Body Dressing: Moderate assistance;Sit to/from stand   Toilet Transfer: Min guard;Stand-pivot;RW;BSC   Toileting- ArchitectClothing Manipulation and Hygiene: Min guard;Sit to/from stand       Functional mobility during ADLs: Min guard;Rolling walker General ADL Comments: Pt limited with LB ADLs due to pain      Vision     Perception     Praxis Praxis Praxis tested?: Within functional limits    Pertinent Vitals/Pain Pain Assessment: 0-10 Faces Pain Scale: Hurts whole lot Pain Location: back  Pain Descriptors / Indicators: Aching;Constant;Grimacing;Guarding;Throbbing Pain Intervention(s): Repositioned     Hand Dominance Right   Extremity/Trunk Assessment Upper Extremity Assessment Upper Extremity Assessment: Generalized weakness   Lower Extremity Assessment Lower Extremity Assessment: Generalized weakness   Cervical / Trunk Assessment Cervical / Trunk Assessment: Kyphotic   Communication Communication Communication: HOH   Cognition Arousal/Alertness: Awake/alert Behavior During Therapy: WFL for tasks assessed/performed Overall Cognitive Status: History of cognitive impairments - at baseline  General Comments       Exercises       Shoulder Instructions      Home Living Family/patient expects to be discharged to:: Private residence Living Arrangements: Children Available Help at Discharge: Personal care  attendant;Available PRN/intermittently;Family Type of Home: House Home Access: Stairs to enter [employer] of Steps: 2 Entrance Stairs-Rails: None Home Layout: One level     Bathroom Shower/Tub: Producer, television/film/video: Standard Bathroom Accessibility: Yes How Accessible: Accessible via walker Home Equipment: Walker - 2 wheels;Cane - single point;Bedside commode;Shower seat;Grab bars - tub/shower;Adaptive equipment Adaptive Equipment: Reacher Additional Comments: Pt reports that son will be available to assist her at discharge, per pt report. She has a caregiver that assists with grocery shopping, transportation, household management       Prior Functioning/Environment Level of Independence: Independent with assistive device(s)        Comments: aide two days a week four hours; son hs been staying with patient, and was in and out, but has planned trip with family after Christmas    OT Diagnosis: Generalized weakness;Acute pain   OT Problem List: Decreased strength;Decreased activity tolerance;Impaired balance (sitting and/or standing);Decreased safety awareness;Decreased knowledge of use of DME or AE;Decreased knowledge of precautions;Pain   OT Treatment/Interventions: Self-care/ADL training;DME and/or AE instruction;Therapeutic activities;Patient/family education;Balance training    OT Goals(Current goals can be found in the care plan section) Acute Rehab OT Goals Patient Stated Goal: to get better  OT Goal Formulation: With patient Time For Goal Achievement: 10/09/15 Potential to Achieve Goals: Good ADL Goals Pt Will Perform Grooming: with supervision;standing Pt Will Perform Lower Body Bathing: with supervision;sit to/from stand Pt Will Perform Lower Body Dressing: with supervision;sit to/from stand Pt Will Transfer to Toilet: with supervision;ambulating;regular height toilet;bedside commode;grab bars Pt Will Perform Toileting - Clothing Manipulation  and hygiene: with supervision;sit to/from stand  OT Frequency: Min 2X/week   Barriers to D/C:            Co-evaluation              End of Session Equipment Utilized During Treatment: Rolling walker Nurse Communication: Mobility status  Activity Tolerance: Patient limited by pain Patient left: in bed;with call bell/phone within reach;with bed alarm set   Time: 1610-9604 OT Time Calculation (min): 22 min Charges:  OT General Charges $OT Visit: 1 Procedure OT Evaluation $Initial OT Evaluation Tier I: 1 Procedure G-Codes:    Jeani Hawking M 10/22/2015, 5:15 PM

## 2015-09-25 NOTE — ED Notes (Signed)
Dr. Adela Glimpseoutova made aware of D-dimer

## 2015-09-25 NOTE — ED Notes (Signed)
Pt going to xray for right wrist

## 2015-09-25 NOTE — Progress Notes (Signed)
CRITICAL VALUE ALERT  Critical value received:  PT-65.3  INR 8.22  Date of notification:  09/25/2015  Time of notification:  0503  Critical value read back:Yes.    Nurse who received alert:  Milta DeitersHANEY, Darika Ildefonso  MD notified (1st page):  Devonne DoughtyKaren Kirby,NP via text page  Time of first page:  0507  MD notified (2nd page):n/a  Time of second page:N/A  Responding MD:  Maren ReamerKaren Kirby.NP  Time MD responded:  [phone]

## 2015-09-26 ENCOUNTER — Encounter (HOSPITAL_COMMUNITY): Payer: Self-pay | Admitting: Cardiology

## 2015-09-26 ENCOUNTER — Inpatient Hospital Stay (HOSPITAL_COMMUNITY): Payer: Medicare Other

## 2015-09-26 DIAGNOSIS — I951 Orthostatic hypotension: Secondary | ICD-10-CM

## 2015-09-26 DIAGNOSIS — I5032 Chronic diastolic (congestive) heart failure: Secondary | ICD-10-CM

## 2015-09-26 DIAGNOSIS — I480 Paroxysmal atrial fibrillation: Secondary | ICD-10-CM

## 2015-09-26 DIAGNOSIS — I5033 Acute on chronic diastolic (congestive) heart failure: Secondary | ICD-10-CM

## 2015-09-26 DIAGNOSIS — I10 Essential (primary) hypertension: Secondary | ICD-10-CM

## 2015-09-26 LAB — BASIC METABOLIC PANEL
ANION GAP: 12 (ref 5–15)
BUN: 20 mg/dL (ref 6–20)
CHLORIDE: 92 mmol/L — AB (ref 101–111)
CO2: 22 mmol/L (ref 22–32)
Calcium: 9 mg/dL (ref 8.9–10.3)
Creatinine, Ser: 1.41 mg/dL — ABNORMAL HIGH (ref 0.44–1.00)
GFR calc Af Amer: 39 mL/min — ABNORMAL LOW (ref >60)
GFR, EST NON AFRICAN AMERICAN: 33 mL/min — AB (ref >60)
GLUCOSE: 127 mg/dL — AB (ref 65–99)
POTASSIUM: 4.2 mmol/L (ref 3.5–5.1)
Sodium: 126 mmol/L — ABNORMAL LOW (ref 135–145)

## 2015-09-26 LAB — URINE CULTURE

## 2015-09-26 LAB — CBC
HCT: 28.7 % — ABNORMAL LOW (ref 36.0–46.0)
Hemoglobin: 9.7 g/dL — ABNORMAL LOW (ref 12.0–15.0)
MCH: 31.9 pg (ref 26.0–34.0)
MCHC: 33.8 g/dL (ref 30.0–36.0)
MCV: 94.4 fL (ref 78.0–100.0)
PLATELETS: 236 10*3/uL (ref 150–400)
RBC: 3.04 MIL/uL — AB (ref 3.87–5.11)
RDW: 14.9 % (ref 11.5–15.5)
WBC: 9.2 10*3/uL (ref 4.0–10.5)

## 2015-09-26 LAB — PROTIME-INR
INR: 2.05 — ABNORMAL HIGH (ref 0.00–1.49)
Prothrombin Time: 23 seconds — ABNORMAL HIGH (ref 11.6–15.2)

## 2015-09-26 MED ORDER — CARVEDILOL 6.25 MG PO TABS
6.2500 mg | ORAL_TABLET | Freq: Two times a day (BID) | ORAL | Status: DC
  Administered 2015-09-26 – 2015-09-29 (×7): 6.25 mg via ORAL
  Filled 2015-09-26 (×6): qty 1

## 2015-09-26 NOTE — Consult Note (Signed)
   [facility]   09/26/2015  Leana Roenita D Mancini [birth year] [id number] Consent for services signed by son. Left contact information and [facility] literature with son, Loraine LericheMark Made Inpatient Case Manager aware that [facility] following. Of note, [facility] services does not replace or interfere with any services that are arranged by inpatient case management or social work.  For additional questions or referrals please contact:   Charlesetta ShanksVictoria Shardae Kleinman, RN BSN CCM [facility] Lia[facility]  [phone] business mobile phone

## 2015-09-26 NOTE — Progress Notes (Signed)
Utilization review completed. Alfretta Pinch, RN, BSN. 

## 2015-09-26 NOTE — Progress Notes (Addendum)
Physical Therapy Treatment Patient Details Name: Madeline Serrano MRN: [id number] DOB: [birth year] Today's Date: 09/26/2015    History of Present Illness Patient is an 81 y/o female with  past medical history of PAF (paroxysmal atrial fibrillation) (HCC); Hypertension; High cholesterol; Macular degeneration; GERD (gastroesophageal reflux disease); TIA (transient ischemic attack); DJD (degenerative joint disease) (10/13); Chronic anticoagulation; Aortic valve stenosis, mild (11/13); Ejection fraction; and Diastolic heart failure (HCC).  Reports feeling short of breath at baseline due to COPD. She has no been feeling well. She was supposed to have a PCP appointment and fell as was trying to get ready because she felt lightheaded.  Admitted with CHF exacerbation.    PT Comments    Pt reports dizziness in sitting and standing ("I feel like I'm going to black out"). Orthostatic BP: supine 144/45 (from chart earlier this morning, pt too fatigued to return to bed after standing for BP reading), sitting 119/43, standing 105/45. Ambulation distance limited today by dizziness. Overall decreased activity tolerance today. At present functional level, ST-SNF recommended. If dizziness resolves, pt would be safe to DC home.    Follow Up Recommendations  Supervision/Assistance - 24 hour;SNF     Equipment Recommendations  None recommended by PT    Recommendations for Other Services       Precautions / Restrictions Precautions Precautions: Fall Restrictions Weight Bearing Restrictions: No    Mobility  Bed Mobility   Bed Mobility: Supine to Sit;Sit to Supine Rolling: Supervision Sidelying to sit: Supervision   Sit to supine: Independent   General bed mobility comments: verbal cues for technique for sidelying to sit, increased time and effort, limited by back pain (pain meds requested); practiced supine to sit x 2, recommended bedrail for home (son said he'd get one)  Transfers Overall transfer  level: Needs assistance Equipment used: Rolling walker (2 wheeled) Transfers: Sit to/from Stand   Stand pivot transfers: Min guard       General transfer comment: for balance and cues for hand placement  Ambulation/Gait   Ambulation Distance (Feet): 55 Feet Assistive device: Rolling walker (2 wheeled) Gait Pattern/deviations: Step-through pattern;Decreased step length - right;Decreased step length - left     General Gait Details: flexed and stiff due to back pain; assist for steadying, but pt able to turn with walker in hallway without difficulty, distance limited by dizziness (noted low BP with 14 point systolic drop sit to stand during PT tx)   Stairs            Wheelchair Mobility    Modified Rankin (Stroke Patients Only)       Balance Overall balance assessment: Needs assistance   Sitting balance-Leahy Scale: Fair       Standing balance-Leahy Scale: Poor                      Cognition Arousal/Alertness: Awake/alert Behavior During Therapy: WFL for tasks assessed/performed Overall Cognitive Status: Within Functional Limits for tasks assessed                      Exercises      General Comments        Pertinent Vitals/Pain Pain Assessment: Faces Pain Score: 9  Faces Pain Scale: Hurts a little bit Pain Location: back Pain Intervention(s): Patient requesting pain meds-RN notified;Monitored during session    Home Living                      Prior  Function            PT Goals (current goals can now be found in the care plan section) Acute Rehab PT Goals Patient Stated Goal: decreased dizzines, get BP under control PT Goal Formulation: With patient/family Time For Goal Achievement: 10/02/15 Potential to Achieve Goals: Good Progress towards PT goals: Not progressing toward goals - comment (limited by dizzines)    Frequency  Min 3X/week    PT Plan Discharge plan needs to be updated    Co-evaluation              End of Session Equipment Utilized During Treatment: Gait belt Activity Tolerance: Patient limited by fatigue;Treatment limited secondary to medical complications (Comment) (dizziness in sitting and standing) Patient left: in chair;with family/visitor present;with call bell/phone within reach     Time: 1103-1129 PT Time Calculation (min) (ACUTE ONLY): 26 min  Charges:  $Gait Training: 8-22 mins $Therapeutic Activity: 8-22 mins                    G Codes:      Tamala Ser 09/26/2015, 11:39 AM [phone]

## 2015-09-26 NOTE — Care Management Note (Signed)
Case Management Note  Patient Details  Name: Leana Roenita D Boesen MRN: [id number] Date of Birth: 1929-10-10  Subjective/Objective:            Admitted with CHF        Action/Plan: Talked to patient with her son Loraine LericheMark at bedside about DCP. Patient has Life Alert Medical System at home, she wears the Life Alert necklace and knows to push the button for medical emergencies. She has a personal care service that provides a nurses aide 2 times a week for 4 hrs. Patient could also benefit from a Disease Management Program for CHF- [facility]; stated  " I used them 3 years ago and I really liked them." [place] with [facility] called for arrangements. She has a rolling walker at home, does not exercise at this time. Patient is very nervous about being in the hospital, emotional support given.   Expected Discharge Date:   possibly 09/27/2015               Expected Discharge Plan:  Home w Home Health Services  Discharge planning Services  CM Consult   Choice offered to:  Patient  HH Arranged:  RN, Disease Management, PT [facility]  Status of Service:  In process, will continue to follow  Reola MosherChandler, Harjot Zavadil L, RN,MHA,BSN [phone] 09/26/2015, 10:45 AM

## 2015-09-26 NOTE — Consult Note (Signed)
   [facility] CM Inpatient Consult   09/26/2015  Madeline Serrano [birth year] [id number] Referral received to assess for care management services. Patient is eligible for [facility] Care Management is a covered benefit of Medicare insurance.   Met with the patient and son, Madeline Serrano, regarding the benefits of [facility] Care Management services. Review information for [facility] Care Management and a folder was provided with contact information.  Explained that [place] Management does not interfere with or replace any services arranged by the inpatient care management staff.  Patient was having some medical issues and Dr. Carles Collet to bedside. Will follow up for post hospital needs and for consent for services, if accepted. For questions, please contact: Natividad Brood, RN BSN [facility] Liaison  [phone] business mobile phone

## 2015-09-26 NOTE — Evaluation (Signed)
Clinical/Bedside Swallow Evaluation Patient Details  Name: Madeline Serrano MRN: [id number] Date of Birth: 07-15-1930  Today's Date: 09/26/2015 Time: SLP Start Time (ACUTE ONLY): 0940 SLP Stop Time (ACUTE ONLY): 1007 SLP Time Calculation (min) (ACUTE ONLY): 27 min  Past Medical History:  Past Medical History  Diagnosis Date  . PAF (paroxysmal atrial fibrillation) (HCC)     NSR on [place]  . Hypertension     labile  . High cholesterol   . Macular degeneration   . GERD (gastroesophageal reflux disease)   . TIA (transient ischemic attack)     several in past  . DJD (degenerative joint disease) 10/13    s/p rt THR  . Chronic anticoagulation   . Aortic valve stenosis, mild 11/13  . Ejection fraction   . Diastolic heart failure [facility])    Past Surgical History:  Past Surgical History  Procedure Laterality Date  . Hip arthroplasty  06/30/2012    Procedure: ARTHROPLASTY BIPOLAR HIP;  Surgeon: Shelda Pal, MD;  Location: [facility] OR;  Service: Orthopedics;  Laterality: Right;  right hip hemiarthroplasty  . Carotid duplex  03/20/2012    Bilateral Bulb/Proximal ICAs-mild plaque, mildly abnormal scan  . Cardiovascular stress test  10/13/2010    Normal study, no ECG changes  . Transthoracic echocardiogram  08/25/2012    EF 60-65%, moderately calcified leaflets of the aortic valve, LA moderately dilated,   HPI:  81 year old female history of chronic diastolic heart failure hypertension presents syncope and pulmonary edema consistent with acute on chronic heart failure. CT shows There is debris in the bilateral mainstem bronchus concerning for aspiration.    Assessment / Plan / Recommendation Clinical Impression  Pt demonstrates normal oral and oropharygneal function with no immediate signs of aspiration. Pt does report occasional episodes of choking when her food "gets stuck" and she may have nasal regurgitation. She also reports a lifetime history of GERD. She was observed after taking several  pills to have some belching and delayed coughing and reported globus. Suspect pt may have a primary esophageal dysphagia based on history and report. SLP discussed findigns and basic esophageal precautions and strategies. Given finding of debris in bilateral bronchi there is a possiblity of postprandial aspiration in this pt. No further SLP fun needed, but pt may benefit from esophageal assessment. Will discuss with MD.     Aspiration Risk  Moderate aspiration risk    Diet Recommendation Regular;Thin liquid   Liquid Administration via: Cup;Straw Medication Administration: Whole meds with liquid Supervision: Patient able to self feed Compensations: Follow solids with liquid Postural Changes: Seated upright at 90 degrees;Remain upright for at least 30 minutes after po intake    Other  Recommendations Recommended Consults: Consider esophageal assessment Oral Care Recommendations: Oral care BID   Follow up Recommendations  None    Frequency and Duration            Prognosis        Swallow Study   General HPI: 81 year old female history of chronic diastolic heart failure hypertension presents syncope and pulmonary edema consistent with acute on chronic heart failure. CT shows There is debris in the bilateral mainstem bronchus concerning for aspiration.  Type of Study: Bedside Swallow Evaluation Previous Swallow Assessment: none Diet Prior to this Study: Regular;Thin liquids Temperature Spikes Noted: No Respiratory Status: Room air History of Recent Intubation: No Behavior/Cognition: Alert;Cooperative;Pleasant mood Oral Cavity Assessment: Within Functional Limits Oral Care Completed by SLP: Recent completion by staff Oral Cavity - Dentition: Dentures, top;Dentures,  bottom Vision: Functional for self-feeding Self-Feeding Abilities: Able to feed self Patient Positioning: Upright in chair Baseline Vocal Quality: Normal Volitional Cough: Strong Volitional Swallow: Able to elicit     Oral/Motor/Sensory Function Overall Oral Motor/Sensory Function: Within functional limits   Ice Chips     Thin Liquid Thin Liquid: Within functional limits    Nectar Thick Nectar Thick Liquid: Not tested   Honey Thick Honey Thick Liquid: Not tested   Puree Puree: Within functional limits   Solid Solid: Within functional limits      Harlon DittyBonnie Ashira Kirsten, MA CCC-SLP [phone]  Peyson Delao, Riley NearingBonnie Caroline 09/26/2015,10:43 AM

## 2015-09-26 NOTE — Consult Note (Signed)
Reason for Consult: anticoagulation, orthostatic hypotension, diastolic HF   Referring Physician: Dr. Carles Collet   PCP:  Marton Redwood, MD  Primary Cardiologist:Dr. Leanord Hawking D Fleischhacker is an 81 y.o. female.    Chief Complaint: pt admitted 09/24/15 with SOB with COPD and just not feeling well after recent fall.    HPI: asked to see 81 year old female with hx. of atrial fibrillation with RVR in the past, on Coumadin anticoagulation and flecainide, maintaining sinus rhythm. Her other problems include hypertension, hyperlipidemia, and positive family history for heart disease with a father who died of an MI at age 77. She also has GERD. By echocardiogram, she has normal LV size and function with mild left atrial enlargement and normal Myoview. She has had no episodes of breakthrough PAF since her flecainide dose was adjusted.  She also had several TIAs in the past , and had carotid Dopplers that were entirely normal.. She has been adjusting her blood pressure medicines because of symptomatic hypotension in the morning after taking her Coreg and hypertension later in the afternoon. Dr. Florene Glen recently referred her to Dr. Grayce Sessions at [facility] for further evaluation of labile hypertension.    Pt admitted 09/24/15 and found to have Pulmonary edema and admitted to diuresis and COPD exacerbation with steroid taper. With recent falls due to dizziness CT angiogram was done of the chest revealing a left posterior chest wall contusion, but was negative for pulmonary embolus.  Also with hyponatremia with Na 125, BNP 1102 troponins negative.  + anemia at 9.5/28.5 and INR  8.22--now to 2.05 and question if coumadin should be resumed with falls.    EKG SB at 56 1st degree AV block at 258 ms, LBBB no acute changes from 4.2016  Negative 1063 since admit. Wt down from 152 lbs to 143 lbs. Since admit  Study Conclusions  - Left ventricle: The cavity size was normal. There was mild  focal basal hypertrophy of the septum. Systolic function was normal. The estimated ejection fraction was in the range of 50% to 55%. There is akinesis of the basalinferolateral myocardium. Doppler parameters are consistent with restrictive physiology, indicative of decreased left ventricular diastolic compliance and/or increased left atrial pressure. Doppler parameters are consistent with high ventricular filling pressure. - Aortic valve: Valve mobility was restricted. There was mild regurgitation. - Mitral valve: Calcified annulus. Mildly thickened leaflets . There was moderate regurgitation. - Left atrium: The atrium was moderately dilated. - Pulmonary arteries: Systolic pressure was mildly to moderately increased. PA peak pressure: 46 mm Hg (S).  Impressions:  - Akinesis of the basal inferior lateral wall with overall preserved LV function; restrictive LV filling; moderate LAE; calcified aortic valve with no AS by doppler; mild AI; moderate MR; trace TR with mild to moderate elevation in pulmonary pressure. The akinesis is not mentioned in last echo of 05/2014.  Past Medical History  Diagnosis Date  . PAF (paroxysmal atrial fibrillation) (HCC)     NSR on Flecanide  . Hypertension     labile  . High cholesterol   . Macular degeneration   . GERD (gastroesophageal reflux disease)   . TIA (transient ischemic attack)     several in past  . DJD (degenerative joint disease) 10/13    s/p rt THR  . Chronic anticoagulation   . Aortic valve stenosis, mild 11/13  . Ejection fraction   . Diastolic heart failure ([place])  Past Surgical History  Procedure Laterality Date  . Hip arthroplasty  06/30/2012    Procedure: ARTHROPLASTY BIPOLAR HIP;  Surgeon: Mauri Pole, MD;  Location: [place];  Service: Orthopedics;  Laterality: Right;  right hip hemiarthroplasty  . Carotid duplex  03/20/2012    Bilateral Bulb/Proximal ICAs-mild plaque, mildly abnormal scan   . Cardiovascular stress test  10/13/2010    Normal study, no ECG changes  . Transthoracic echocardiogram  08/25/2012    EF 60-65%, moderately calcified leaflets of the aortic valve, LA moderately dilated,    Family History  Problem Relation Age of Onset  . Emphysema Mother   . Diabetes Father   . Heart attack Father 102  . Stroke Maternal Grandfather   . Heart disease Paternal Grandfather   . Heart disease Paternal Grandmother   . Cancer Son     testicular x3  . Pancreatitis Son   . Diabetes Son     Borderline   Social History:  reports that she has never smoked. She has never used smokeless tobacco. She reports that she does not drink alcohol or use illicit drugs.  Allergies:  Allergies  Allergen Reactions  . Chlorthalidone Nausea Only    Lightheadedness, weakness, so "sick feeling"  . Sulfa Antibiotics Other (See Comments)    Avel Sensor  . Vioxx [Rofecoxib] Other (See Comments)    Renal problems  . Celebrex [Celecoxib] Swelling  . Clarithromycin Swelling  . Statins Other (See Comments)    Hair fell out  . Other Other (See Comments)    EXTENDED RELEASE tablets  . Penicillins Rash    OUTPATIENT MEDICATIONS: No current facility-administered medications on file prior to encounter.   Current Outpatient Prescriptions on File Prior to Encounter  Medication Sig Dispense Refill  . CALCIUM PO Take 1 tablet by mouth 3 (three) times daily.     . carvedilol (COREG) 12.5 MG tablet Take 25 mg by mouth 2 (two) times daily with a meal.     . fish oil-omega-3 fatty acids 1000 MG capsule Take 1 g by mouth daily.    . flecainide (TAMBOCOR) 100 MG tablet Take 1 tablet (100 mg total) by mouth 2 (two) times daily. 180 tablet 2  . furosemide (LASIX) 20 MG tablet Take 1 tablet (20 mg total) by mouth daily as needed. For weight gain of 3 pounds or more. 30 tablet 1  . hydrALAZINE (APRESOLINE) 25 MG tablet Take 50 mg by mouth 2 (two) times daily. If Bp elevated takes additional dose  0   . lansoprazole (PREVACID) 15 MG capsule Take 15 mg by mouth daily as needed (reflux).     . nitrofurantoin (MACRODANTIN) 100 MG capsule Take 100 mg by mouth daily.     . nitroGLYCERIN (NITROSTAT) 0.4 MG SL tablet Place 1 tablet (0.4 mg total) under the tongue every 5 (five) minutes as needed for chest pain. 25 tablet 12  . Pitavastatin Calcium (LIVALO) 1 MG TABS Take 0.5 tablets (0.5 mg total) by mouth daily. 30 tablet 11  . tiotropium (SPIRIVA) 18 MCG inhalation capsule Place 18 mcg into inhaler and inhale daily.    Marland Kitchen warfarin (COUMADIN) 2 MG tablet Take 4 mg by mouth See admin instructions. TAKES 4MG ONCE WEEKLY ON THURS ONLY       Results for orders placed or performed during the hospital encounter of 09/24/15 (from the past 48 hour(s))  Urinalysis, Routine w reflex microscopic (not at [facility])     Status: Abnormal   Collection Time:  09/24/15  8:25 PM  Result Value Ref Range   Color, Urine YELLOW YELLOW   APPearance HAZY (A) CLEAR   Specific Gravity, Urine 1.014 1.005 - 1.030   pH 7.0 5.0 - 8.0   Glucose, UA NEGATIVE NEGATIVE mg/dL   Hgb urine dipstick NEGATIVE NEGATIVE   Bilirubin Urine NEGATIVE NEGATIVE   Ketones, ur NEGATIVE NEGATIVE mg/dL   Protein, ur NEGATIVE NEGATIVE mg/dL   Nitrite NEGATIVE NEGATIVE   Leukocytes, UA SMALL (A) NEGATIVE  Urine culture     Status: None   Collection Time: 09/24/15  8:25 PM  Result Value Ref Range   Specimen Description URINE, CLEAN CATCH    Special Requests NONE    Culture MULTIPLE SPECIES PRESENT, SUGGEST RECOLLECTION    Report Status 09/26/2015 FINAL   Urine microscopic-add on     Status: Abnormal   Collection Time: 09/24/15  8:25 PM  Result Value Ref Range   Squamous Epithelial / LPF 6-30 (A) NONE SEEN   WBC, UA 6-30 0 - 5 WBC/hpf   RBC / HPF 0-5 0 - 5 RBC/hpf   Bacteria, UA FEW (A) NONE SEEN  Comprehensive metabolic panel     Status: Abnormal   Collection Time: 09/24/15  8:29 PM  Result Value Ref Range   Sodium 125 (L) 135 - 145  mmol/L   Potassium 4.2 3.5 - 5.1 mmol/L   Chloride 94 (L) 101 - 111 mmol/L   CO2 24 22 - 32 mmol/L   Glucose, Bld 146 (H) 65 - 99 mg/dL   BUN 11 6 - 20 mg/dL   Creatinine, Ser 0.99 0.44 - 1.00 mg/dL   Calcium 9.1 8.9 - 10.3 mg/dL   Total Protein 5.6 (L) 6.5 - 8.1 g/dL   Albumin 3.1 (L) 3.5 - 5.0 g/dL   AST 31 15 - 41 U/L   ALT 15 14 - 54 U/L   Alkaline Phosphatase 97 38 - 126 U/L   Total Bilirubin 1.2 0.3 - 1.2 mg/dL   GFR calc non Af Amer 51 (L) >60 mL/min   GFR calc Af Amer 59 (L) >60 mL/min    Comment: (NOTE) The eGFR has been calculated using the CKD EPI equation. This calculation has not been validated in all clinical situations. eGFR's persistently <60 mL/min signify possible Chronic Kidney Disease.    Anion gap 7 5 - 15  CBC with Differential     Status: Abnormal   Collection Time: 09/24/15  8:29 PM  Result Value Ref Range   WBC 7.5 4.0 - 10.5 K/uL   RBC 3.06 (L) 3.87 - 5.11 MIL/uL   Hemoglobin 9.5 (L) 12.0 - 15.0 g/dL   HCT 28.5 (L) 36.0 - 46.0 %   MCV 93.1 78.0 - 100.0 fL   MCH 31.0 26.0 - 34.0 pg   MCHC 33.3 30.0 - 36.0 g/dL   RDW 14.6 11.5 - 15.5 %   Platelets 183 150 - 400 K/uL   Neutrophils Relative % 80 %   Neutro Abs 6.0 1.7 - 7.7 K/uL   Lymphocytes Relative 9 %   Lymphs Abs 0.7 0.7 - 4.0 K/uL   Monocytes Relative 7 %   Monocytes Absolute 0.6 0.1 - 1.0 K/uL   Eosinophils Relative 4 %   Eosinophils Absolute 0.3 0.0 - 0.7 K/uL   Basophils Relative 0 %   Basophils Absolute 0.0 0.0 - 0.1 K/uL  D-dimer, quantitative (not at [facility])     Status: Abnormal   Collection Time: 09/24/15  8:29 PM  Result Value Ref Range   D-Dimer, Quant 2.33 (H) 0.00 - 0.50 ug/mL-FEU    Comment: (NOTE) At the manufacturer cut-off of 0.50 ug/mL FEU, this assay has been documented to exclude PE with a sensitivity and negative predictive value of 97 to 99%.  At this time, this assay has not been approved by the FDA to exclude DVT/VTE. Results should be correlated with clinical  presentation.   I-Stat CG4 Lactic Acid, ED  (not at [facility])     Status: None   Collection Time: 09/24/15  8:36 PM  Result Value Ref Range   Lactic Acid, Venous 1.03 0.5 - 2.0 mmol/L  Brain natriuretic peptide     Status: Abnormal   Collection Time: 09/24/15  8:39 PM  Result Value Ref Range   B Natriuretic Peptide 1102.4 (H) 0.0 - 100.0 pg/mL  I-Stat Troponin, ED (not at [facility])     Status: None   Collection Time: 09/24/15 10:06 PM  Result Value Ref Range   Troponin i, poc 0.00 0.00 - 0.08 ng/mL   Comment 3            Comment: Due to the release kinetics of cTnI, a negative result within the first hours of the onset of symptoms does not rule out myocardial infarction with certainty. If myocardial infarction is still suspected, repeat the test at appropriate intervals.   Troponin I     Status: None   Collection Time: 09/25/15 12:39 AM  Result Value Ref Range   Troponin I <0.03 <0.031 ng/mL    Comment:        NO INDICATION OF MYOCARDIAL INJURY.   Protime-INR     Status: Abnormal   Collection Time: 09/25/15 12:39 AM  Result Value Ref Range   Prothrombin Time 65.3 (H) 11.6 - 15.2 seconds   INR 8.22 (HH) 0.00 - 1.49    Comment: REPEATED TO VERIFY CRITICAL RESULT CALLED TO, READ BACK BY AND VERIFIED WITH: HANEY RASHEEDA RN AT 0503 12.22.16 BY COCHRANE S    TSH     Status: None   Collection Time: 09/25/15  3:39 AM  Result Value Ref Range   TSH 1.340 0.350 - 4.500 uIU/mL  Creatinine, urine, random     Status: None   Collection Time: 09/25/15  3:55 AM  Result Value Ref Range   Creatinine, Urine 20.04 mg/dL  Sodium, urine, random     Status: None   Collection Time: 09/25/15  3:55 AM  Result Value Ref Range   Sodium, Ur 76 mmol/L  Osmolality, urine     Status: Abnormal   Collection Time: 09/25/15  3:55 AM  Result Value Ref Range   Osmolality, Ur 249 (L) 300 - 900 mOsm/kg  Troponin I     Status: None   Collection Time: 09/25/15  8:50 AM  Result Value Ref Range   Troponin I  <0.03 <0.031 ng/mL    Comment:        NO INDICATION OF MYOCARDIAL INJURY.   Basic metabolic panel     Status: Abnormal   Collection Time: 09/25/15  8:50 AM  Result Value Ref Range   Sodium 126 (L) 135 - 145 mmol/L   Potassium 4.3 3.5 - 5.1 mmol/L   Chloride 92 (L) 101 - 111 mmol/L   CO2 25 22 - 32 mmol/L   Glucose, Bld 124 (H) 65 - 99 mg/dL   BUN 11 6 - 20 mg/dL   Creatinine, Ser 1.03 (H) 0.44 - 1.00 mg/dL   Calcium  9.4 8.9 - 10.3 mg/dL   GFR calc non Af Amer 49 (L) >60 mL/min   GFR calc Af Amer 57 (L) >60 mL/min    Comment: (NOTE) The eGFR has been calculated using the CKD EPI equation. This calculation has not been validated in all clinical situations. eGFR's persistently <60 mL/min signify possible Chronic Kidney Disease.    Anion gap 9 5 - 15  Prealbumin     Status: None   Collection Time: 09/25/15  8:50 AM  Result Value Ref Range   Prealbumin 21.4 18 - 38 mg/dL  Troponin I     Status: None   Collection Time: 09/25/15  1:30 PM  Result Value Ref Range   Troponin I <0.03 <0.031 ng/mL    Comment:        NO INDICATION OF MYOCARDIAL INJURY.   Osmolality     Status: Abnormal   Collection Time: 09/25/15  2:50 PM  Result Value Ref Range   Osmolality 269 (L) 275 - 295 mOsm/kg  Basic metabolic panel     Status: Abnormal   Collection Time: 09/26/15  4:28 AM  Result Value Ref Range   Sodium 126 (L) 135 - 145 mmol/L   Potassium 4.2 3.5 - 5.1 mmol/L   Chloride 92 (L) 101 - 111 mmol/L   CO2 22 22 - 32 mmol/L   Glucose, Bld 127 (H) 65 - 99 mg/dL   BUN 20 6 - 20 mg/dL   Creatinine, Ser 1.41 (H) 0.44 - 1.00 mg/dL   Calcium 9.0 8.9 - 10.3 mg/dL   GFR calc non Af Amer 33 (L) >60 mL/min   GFR calc Af Amer 39 (L) >60 mL/min    Comment: (NOTE) The eGFR has been calculated using the CKD EPI equation. This calculation has not been validated in all clinical situations. eGFR's persistently <60 mL/min signify possible Chronic Kidney Disease.    Anion gap 12 5 - 15  Protime-INR      Status: Abnormal   Collection Time: 09/26/15  4:28 AM  Result Value Ref Range   Prothrombin Time 23.0 (H) 11.6 - 15.2 seconds   INR 2.05 (H) 0.00 - 1.49  CBC     Status: Abnormal   Collection Time: 09/26/15  4:28 AM  Result Value Ref Range   WBC 9.2 4.0 - 10.5 K/uL   RBC 3.04 (L) 3.87 - 5.11 MIL/uL   Hemoglobin 9.7 (L) 12.0 - 15.0 g/dL   HCT 28.7 (L) 36.0 - 46.0 %   MCV 94.4 78.0 - 100.0 fL   MCH 31.9 26.0 - 34.0 pg   MCHC 33.8 30.0 - 36.0 g/dL   RDW 14.9 11.5 - 15.5 %   Platelets 236 150 - 400 K/uL   Dg Chest 2 View  09/24/2015  CLINICAL DATA:  Cough and shortness of breath for 1 day EXAM: CHEST  2 VIEW COMPARISON:  12/17/2014 FINDINGS: There is a 7 mm nodular opacity in the right upper lobe. There is bilateral interstitial thickening. There are bilateral small pleural effusions. There is prominence of the central pulmonary vasculature. There is no pneumothorax. The heart and mediastinal contours are unremarkable. There is severe osteoarthritis of the left glenohumeral joint. IMPRESSION: 1. Bilateral interstitial thickening and bilateral pleural effusions concerning for mild pulmonary edema. 2. 7 mm nodular opacity in the right upper lobe. Recommend further evaluation with a nonemergent CT of the chest. Electronically Signed   By: Kathreen Devoid   On: 09/24/2015 20:58   Dg Wrist 2 Views  Right  09/25/2015  CLINICAL DATA:  Wrist pain after fall.  Initial encounter. EXAM: RIGHT WRIST - 2 VIEW COMPARISON:  12/17/2014 FINDINGS: No evidence of acute fracture or dislocation. Advanced first [place] osteoarthritis with subluxation, fragmentation, and metacarpal base cystic change. Osteopenia and chondrocalcinosis. IMPRESSION: No acute finding. Electronically Signed   By: Monte Fantasia M.D.   On: 09/25/2015 01:03   Ct Angio Chest Pe W/cm &/or Wo Cm  09/25/2015  CLINICAL DATA:  Dyspnea.  Fall today with back pain EXAM: CT ANGIOGRAPHY CHEST WITH CONTRAST TECHNIQUE: Multidetector CT imaging of the chest  was performed using the standard protocol during bolus administration of intravenous contrast. Multiplanar CT image reconstructions and MIPs were obtained to evaluate the vascular anatomy. CONTRAST:  169m OMNIPAQUE IOHEXOL 350 MG/ML SOLN COMPARISON:  None. FINDINGS: THORACIC INLET/BODY WALL: Subcutaneous reticulation along the left posterior chest wall compatible with contusion. 1 cm right thyroid nodule which is considered incidental. MEDIASTINUM: Normal heart size. No pericardial effusion. No evidence of acute vascular abnormality, including pulmonary embolism or aortic dissection. Extensive atherosclerosis. No adenopathy. LUNG WINDOWS: Mosaic attenuation lungs which is likely air trapping. There is extensive airways debris with layering in the bilateral mainstem bronchus. Dependent atelectasis with small layering effusions. Spiculated 8 mm pulmonary nodule in the left upper lobe. UPPER ABDOMEN: No acute findings. OSSEOUS: Severe glenohumeral osteoarthritis on the left with joint effusion and secondary osteochondromatosis. Review of the MIP images confirms the above findings. IMPRESSION: 1. No evidence of pulmonary embolism. 2. Partly visualized left posterior chest wall contusion. 3. Extensive airway debris, question aspiration. 4. Small bilateral pleural effusion and mild atelectasis. 5. 8 mm spiculated nodule in the left upper lobe. Follow-up chest CT at [id number] recommended. Electronically Signed   By: JoMonte Fantasia.D.   On: 09/25/2015 02:06    ROS: General:no colds or fevers, + weight loss of 9 lbs with lasix Skin:no rashes or ulcers HEENT:no blurred vision, no congestion CV:see HPI PUL:see HPI GI:no diarrhea constipation or melena, no indigestion GU:no hematuria, no dysuria MS:no joint pain, no claudication, + back pain from fall and hematoma Neuro:no syncope, + lightheadedness Endo:no diabetes, no thyroid disease   Blood pressure 123/36, pulse 57, temperature 97.6 F (36.4 C),  temperature source Oral, resp. rate 20, height '5\' 5"'  (1.651 m), weight 143 lb 14.4 oz (65.273 kg), SpO2 98 %.  Wt Readings from Last 3 Encounters:  09/26/15 143 lb 14.4 oz (65.273 kg)  09/10/15 157 lb (71.215 kg)  01/30/15 159 lb 4.8 oz (72.258 kg)    PE: General:Pleasant affect, NAD, hard of hearing Skin:Warm and dry, brisk capillary refill HEENT:normocephalic, sclera clear, mucus membranes dry Neck:supple, no JVD, no carotid bruit  Heart:S1S2 RRR with soft systolic murmur, no gallup, rub or click Lungs:clear without rales, rhonchi, or wheezes AbHMC:NOBSnon tender, + BS, do not palpate liver spleen or masses Ext:no lower ext edema, 2+ pedal pulses, 2+ radial pulses Neuro:alert and oriented, MAE, follows commands, + facial symmetry Tele: SR  Assessment/Plan Active Problems:   Hyponatremia   Hypertension   COPD (chronic obstructive pulmonary disease) (HCC)   PAF NSR on Flecanide   Acute on chronic diastolic heart failure (HCC)   Aortic stenosis   Pulmonary nodule   Pre-syncope   CHF exacerbation (HCC)   Paroxysmal atrial fibrillation (HCC)   Orthostatic hypotension   Chronic diastolic CHF (congestive heart failure) (HCC)   Dyspnea- acute on chronic diastolic HF --neg [id number] admit --wt down 9 lbs since admit  Supra  therapeutic INR with back hematoma and anemia  --vit K was given --? Resume coumadin with  CHADSVASc- 6  PAF- on flecainide maintaining SR   Orthostatic hypotension  bp today sitting 119/43--standing 105/45  --chronic has been [facility] 08/12/[facility]  Per Dr. Hartford Poli " goal a) to improve blood pressure control and b) to improve quality of life which is markedly limited because of the fear of falling due to low blood pressure. Patient does not have clinical characteristics of Shay-Dragger syndrome. As related to medications, currently, patient is on long acting beta blocker (Carvedilol). We may consider substituting with Metoprolol later on. Patient is also on  Hydralazine 50 mg twice daily which in part may further increase vasodilation which is a part of problem with orthostatic hypotension. Patient relatively is on restricted fluid intake and low sodium intake. I discussed with the patient both medical and non-medical therapy. We will start with "non-medical" therapy which includes wearing 20-30 mmHg knee high compression stockings (optional thigh high) with adding some salt to her diet. During the daytime she should be drinking ice cold drinks and warm beverages in the evening and at nighttime. Prior to the next appointment, we will perform from diagnostic standpoint of view, 24 hour Ambulatory Blood Pressure Monitoring to better delineate changes in the blood pressure during the daytime and nighttime. The rationale was explained to the patient and her son. They were understanding and agreeable. Also at the time of next visit we will perform renal duplex to rule out renal artery stenosis as a possible contributing factor to Stage II hypertension. In the meantime, we will continue with current medications except for reducing the dose of Hydralazine to 25 mg twice daily" --he actually did not reduce hydralazine he wanted to wait for 24 hr BP checks.  She has not yet had follow up appt.    Now her coreg is at 6.25 mg BID and her hydralazine has been stopped.  Her home furosemide is prn 20 mg --here she has rec'd 40 mg IV lasix X 2  Hyponatremia  Today 126   COPD -improved         [facility]  Nurse Practitioner Certified [place] Pager [phone] or after 5pm or weekends call [phone] 09/26/2015, 3:29 PM  Patient seen and examined and history reviewed. Agree with above findings and plan. 81 yo WF with longstanding labile HTN and orthostatic hypotension presents with increased dyspnea secondary to diastolic CHF. She was diuresed with IV lasix with  good response but resultant hypotension. Hydralazine held and Coreg reduced.  She also had a supratherapeutic INR with back hematoma related to fall. Today BP is improved with less orthostasis. I think some temporary alteration in BP expected with diuresis. I expect her BP to return to baseline in next 24-48 hours and we will probably need to resume Hydralazine at lower dose at DC. Will repeat orthostatic vitals in am. With her hematoma I would hold coumadin for a couple of days then resume at prior dose with follow up of INR as outpatient. Continue Flecainide for rhythm control.    Mckinzie Saksa [place], [place] 09/26/2015 4:52 PM

## 2015-09-26 NOTE — Progress Notes (Addendum)
PROGRESS NOTE  Madeline Serrano ZOX:[id number] DOB: [birth year] DOA: 09/24/2015 PCP: Martha ClanShaw, William, MD  Brief history 81 year old female with a history of paroxysmal atrial fibrillation on warfarin, hypertension, hyperlipidemia, diastolic CHF, TIA presented with sudden onset of shortness of breath and chest congestion on the day of admission. Further questioning reveals that the patient has had a cough for the past 7 days with 2 days of shortness of breath. She denies any orthopnea or PND type symptoms. She denies any recent weight gain. Her weight has been stable, and the patient has been compliant with all her medications. She denies any new medications. One day prior to admission, the patient felt lightheaded as she was getting up and trying to put on close and resulted in a mechanical fall without syncope. Review of the medical record reveals that the patient hasn't had a history of orthostatic hypotension. She denies any fevers, chills, chest pain, headache, neck pain, rashes, nausea, vomiting, diarrhea, abdominal pain, dysuria, hematuria. After admission, the patient was given prednisone and intravenous furosemide. CT angiogram of the chest reveals a left posterior chest wall contusion, but was negative for pulmonary embolus.  Assessment/Plan: Dyspnea in the setting of COPD and chronic diastolic CHF -Suspect the patient had a recent viral URI syndrome -Also concerned with possible aspiration event given the acuity of her symptoms and CTA of the chest revealing airway debris layering in the mainstem bronchus.  Neg for PE -Although there were small bilateral pleural effusions, the patient appears clinically euvolemic -Repeat echocardiogram-EF 50-55%, PEEP 46, akinesis of the basal inferolateral myocardium -Discontinue antibiotics except for her maintenance nitrofurantoin -Discontinue furosemide -Daily weights -Monitor clinically Supratherapeutic INR/Back hematoma -There is concern for  the patient mixing up on her warfarin dosing -Vitamin K was given -The patient's mechanical fall has resulted in a large posterior chest wall hematoma -I have discussed the risks and benefits of continuing warfarin in the setting of her falls--she wishes to continue -Recheck INR--2.05 -Hgb stable Paroxysmal atrial fibrillation -CHADSVASc-6 -Continue flecainide -Hold warfarin for now--family requests cardiology opinion before making ultimate decision on warfarin -I have consulted cardiology -EKG--sinus with LBBB, unchanged, first degree AVB Orthostatic hypotension -This has been a chronic problem for the patient -She will have to tolerate a higher blood pressure at rest -Again family requests cardiology opinion whom I have consulted -Discontinue hydralazine -Reduced dose of carvedilol -give fluid challenge Hyponatremia -This has been chronic with a range from 127-131 -I suspect this is probably due to poor solute intake and a degree of volume depletion -give fluid challenge-->1L -Unfortunately, urine sodium was obtained after the patient was given furosemide -Check serum osmolarity, urine osmolarity--appropriate response, not suggestive of SIADH Dysphagia -spoke with speech therapy -no oropharyngeal dysphagia -Concern for esophageal dysphagia -Barium esophagram ordered COPD -Clinically compensated -Continue Spiriva Gait instability -PT evaluation-->HHPT -OT eval Pulmonary nodule -Incidental finding on CT chest -needs outpatient surveillance  Family Communication: Son updated at beside Disposition Plan: Home 1-2 days--total time 40 min    Procedures/Studies: Dg Chest 2 View  09/24/2015  CLINICAL DATA:  Cough and shortness of breath for 1 day EXAM: CHEST  2 VIEW COMPARISON:  12/17/2014 FINDINGS: There is a 7 mm nodular opacity in the right upper lobe. There is bilateral interstitial thickening. There are bilateral small pleural effusions. There is prominence of the  central pulmonary vasculature. There is no pneumothorax. The heart and mediastinal contours are unremarkable. There is severe osteoarthritis of the left glenohumeral  joint. IMPRESSION: 1. Bilateral interstitial thickening and bilateral pleural effusions concerning for mild pulmonary edema. 2. 7 mm nodular opacity in the right upper lobe. Recommend further evaluation with a nonemergent CT of the chest. Electronically Signed   By: Elige Ko   On: 09/24/2015 20:58   Dg Wrist 2 Views Right  09/25/2015  CLINICAL DATA:  Wrist pain after fall.  Initial encounter. EXAM: RIGHT WRIST - 2 VIEW COMPARISON:  12/17/2014 FINDINGS: No evidence of acute fracture or dislocation. Advanced first CMC osteoarthritis with subluxation, fragmentation, and metacarpal base cystic change. Osteopenia and chondrocalcinosis. IMPRESSION: No acute finding. Electronically Signed   By: Marnee Spring M.D.   On: 09/25/2015 01:03   Ct Angio Chest Pe W/cm &/or Wo Cm  09/25/2015  CLINICAL DATA:  Dyspnea.  Fall today with back pain EXAM: CT ANGIOGRAPHY CHEST WITH CONTRAST TECHNIQUE: Multidetector CT imaging of the chest was performed using the standard protocol during bolus administration of intravenous contrast. Multiplanar CT image reconstructions and MIPs were obtained to evaluate the vascular anatomy. CONTRAST:  OMNIPAQUE IOHEXOL 350 MG/ML SOLN COMPARISON:  None. FINDINGS: THORACIC INLET/BODY WALL: Subcutaneous reticulation along the left posterior chest wall compatible with contusion. 1 cm right thyroid nodule which is considered incidental. MEDIASTINUM: Normal heart size. No pericardial effusion. No evidence of acute vascular abnormality, including pulmonary embolism or aortic dissection. Extensive atherosclerosis. No adenopathy. LUNG WINDOWS: Mosaic attenuation lungs which is likely air trapping. There is extensive airways debris with layering in the bilateral mainstem bronchus. Dependent atelectasis with small layering effusions.  Spiculated 8 mm pulmonary nodule in the left upper lobe. UPPER ABDOMEN: No acute findings. OSSEOUS: Severe glenohumeral osteoarthritis on the left with joint effusion and secondary osteochondromatosis. Review of the MIP images confirms the above findings. IMPRESSION: 1. No evidence of pulmonary embolism. 2. Partly visualized left posterior chest wall contusion. 3. Extensive airway debris, question aspiration. 4. Small bilateral pleural effusion and mild atelectasis. 5. 8 mm spiculated nodule in the left upper lobe. Follow-up chest CT at 3-108months is recommended. Electronically Signed   By: Marnee Spring M.D.   On: 09/25/2015 02:06         Subjective: Patient complains of feeling a little bit weak and dizzy. Denies any fevers, chills, chest pain, shortness breath, nausea, vomiting, diarrhea, abdominal pain. No dysuria or hematuria.  Objective: Filed Vitals:   09/26/15 0423 09/26/15 0806 09/26/15 0847 09/26/15 1132  BP: 144/45  123/36   Pulse: 61 59 57   Temp: 97.6 F (36.4 C)     TempSrc: Oral     Resp: Height:      Weight: 65.273 kg (143 lb 14.4 oz)     SpO2: 96% 96% 98% 98%    Intake/Output Summary (Last 24 hours) at 09/26/15 1338 Last data filed at 09/26/15 0846  Gross per 24 hour  Intake    480 ml  Output    650 ml  Net   -170 ml   Weight change: -3.674 kg (-8 lb 1.6 oz) Exam:   General:  Pt is alert, follows commands appropriately, not in acute distress  HEENT: No icterus, No thrush, No neck mass, [place]/AT  Cardiovascular: RRR, S1/S2, no rubs, no gallops  Respiratory: CTA bilaterally, no wheezing, no crackles, no rhonchi  Abdomen: Soft/+BS, non tender, non distended, no guarding  Extremities: No edema, No lymphangitis, No petechiae, No rashes, no   synovitis Neuro:  CN II-XII intact, strength 4/5 in RUE, RLE, strength 4/5 LUE,  LLE; sensation intact bilateral; no dysmetria; babinski equivocal   Data Reviewed: Basic Metabolic Panel:  Recent Labs Lab  09/24/15 2029 09/25/15 0850 09/26/15 0428  NA 125* 126* 126*  K 4.2 4.3 4.2  CL 94* 92* 92*  CO2 GLUCOSE 146* 124* 127*  BUN CREATININE 0.99 1.03* 1.41*  CALCIUM 9.1 9.4 9.0   Liver Function Tests:  Recent Labs Lab 09/24/15 2029  AST 31  ALT 15  ALKPHOS 97  BILITOT 1.2  PROT 5.6*  ALBUMIN 3.1*   No results for input(s): LIPASE, AMYLASE in the last 168 hours. No results for input(s): AMMONIA in the last 168 hours. CBC:  Recent Labs Lab 09/24/15 2029 09/26/15 0428  WBC 7.5 9.2  NEUTROABS 6.0  --   HGB 9.5* 9.7*  HCT 28.5* 28.7*  MCV 93.1 94.4  PLT 183 236   Cardiac Enzymes:  Recent Labs Lab 09/25/15 0039 09/25/15 0850 09/25/15 1330  TROPONINI <0.03 <0.03 <0.03   BNP: Invalid input(s): POCBNP CBG: No results for input(s): GLUCAP in the last 168 hours.  Recent Results (from the past 240 hour(s))  Urine culture     Status: None   Collection Time: 09/24/15  8:25 PM  Result Value Ref Range Status   Specimen Description URINE, CLEAN CATCH  Final   Special Requests NONE  Final   Culture MULTIPLE SPECIES PRESENT, SUGGEST RECOLLECTION  Final   Report Status 09/26/2015 FINAL  Final     Scheduled Meds: . carvedilol  12.5 mg Oral BID WC  . flecainide  100 mg Oral BID  . guaiFENesin  600 mg Oral BID  . mometasone-formoterol  2 puff Inhalation BID  . multivitamin with minerals  1 tablet Oral Daily  . nitrofurantoin  100 mg Oral Daily  . pravastatin  20 mg Oral q1800  . sodium chloride  3 mL Intravenous Q12H  . tiotropium  18 mcg Inhalation Daily   Continuous Infusions:    Aradhana Gin, DO  [facility] Pager [phone]  If 7PM-7AM, please contact night-coverage [email or web address] Password [email or web address] 09/26/2015, 1:38 PM   LOS: 2 days

## 2015-09-26 NOTE — Progress Notes (Signed)
OT Cancellation Note  Patient Details Name: Madeline Serrano MRN: [id number] DOB: 1930-05-02   Cancelled Treatment:    Reason Eval/Treat Not Completed: Patient at procedure or test/ unavailable. [facility] Tat [facility] room. While waiting for them to exit, transport arrived to take pt down for swallow test.  Madeline Serrano, Madeline Serrano Eva [phone] 09/26/2015, 1:15 PM

## 2015-09-27 LAB — BASIC METABOLIC PANEL
ANION GAP: 6 (ref 5–15)
BUN: 25 mg/dL — ABNORMAL HIGH (ref 6–20)
CHLORIDE: 92 mmol/L — AB (ref 101–111)
CO2: 28 mmol/L (ref 22–32)
CREATININE: 1.27 mg/dL — AB (ref 0.44–1.00)
Calcium: 8.8 mg/dL — ABNORMAL LOW (ref 8.9–10.3)
GFR calc non Af Amer: 38 mL/min — ABNORMAL LOW (ref >60)
GFR, EST AFRICAN AMERICAN: 44 mL/min — AB (ref >60)
Glucose, Bld: 100 mg/dL — ABNORMAL HIGH (ref 65–99)
POTASSIUM: 4.2 mmol/L (ref 3.5–5.1)
SODIUM: 126 mmol/L — AB (ref 135–145)

## 2015-09-27 LAB — PROTIME-INR
INR: 1.43 (ref 0.00–1.49)
PROTHROMBIN TIME: 17.5 s — AB (ref 11.6–15.2)

## 2015-09-27 MED ORDER — WARFARIN SODIUM 2.5 MG PO TABS
2.5000 mg | ORAL_TABLET | Freq: Every day | ORAL | Status: DC
  Administered 2015-09-27: 2.5 mg via ORAL
  Filled 2015-09-27: qty 1

## 2015-09-27 MED ORDER — WARFARIN - PHARMACIST DOSING INPATIENT
Freq: Every day | Status: DC
  Administered 2015-09-27: 17:00:00

## 2015-09-27 NOTE — Progress Notes (Signed)
    Subjective:  Denies CP or dyspnea; complains of back pain   Objective:  Filed Vitals:   09/26/15 2058 09/27/15 0406 09/27/15 0449 09/27/15 0900  BP:   129/44 154/45  Pulse:   54 57  Temp:   97.9 F (36.6 C)   TempSrc:   Oral   Resp:      Height:      Weight:  149 lb 11.2 oz (67.903 kg)    SpO2: 96%  95%     Intake/Output from previous day:  Intake/Output Summary (Last 24 hours) at 09/27/15 1110 Last data filed at 09/27/15 0900  Gross per 24 hour  Intake   1040 ml  Output   1020 ml  Net     20 ml    Physical Exam: Physical exam: Well-developed well-nourished in no acute distress.  Skin is warm and dry.  HEENT is normal.  Neck is supple.  Chest is clear to auscultation with normal expansion.  Cardiovascular exam is regular rate and rhythm. 2/6 systolic murmur Abdominal exam nontender or distended. No masses palpated. Extremities show no edema. neuro grossly intact Back with hematoma on the left.    Lab Results: Basic Metabolic Panel:  Recent Labs  16/07/9611/23/16 0428 09/27/15 0300  NA 126* 126*  K 4.2 4.2  CL 92* 92*  CO2 22 28  GLUCOSE 127* 100*  BUN 20 25*  CREATININE 1.41* 1.27*  CALCIUM 9.0 8.8*   CBC:  Recent Labs  09/24/15 2029 09/26/15 0428  WBC 7.5 9.2  NEUTROABS 6.0  --   HGB 9.5* 9.7*  HCT 28.5* 28.7*  MCV 93.1 94.4  PLT 183 236   Cardiac Enzymes:  Recent Labs  09/25/15 0039 09/25/15 0850 09/25/15 1330  TROPONINI <0.03 <0.03 <0.03     Assessment/Plan:  1 paroxysmal atrial fibrillation-patient remains in sinus rhythm on examination and on telemetry. Continue beta blocker and flecainide. Would resume Coumadin at lower dose (ie 2.5 mg daily) at time of discharge. She will need a follow-up INR 3 days after resuming Coumadin. If she has more frequent falls in the future we would most likely discontinue Coumadin at that time. I would like to continue for now as she has had TIAs in the past and will be at high risk for embolic event  if atrial fibrillation recurs. 2 Hypertension-Would resume hydralazine 25 mg by mouth 3 times a day following discharge. [place] in [place] and with Dr. Lowell GuitarPowell for further management. 3 COPD-management per primary care. 4 Chronic diastolic congestive heart failure-improved with diuresis. At discharge would resume Lasix 20 mg daily for weight gain of 2-3 pounds. I would like to avoid daily diuretics if possible even history of orthostasis. We will sign off. Please call with questions. Patient should follow-up with Dr. Allyson SabalBerry one week after discharge.  Olga MillersBrian Crenshaw 09/27/2015, 11:10 AM

## 2015-09-27 NOTE — Progress Notes (Signed)
Pharmacy consulted for warfarin.  On warfarin PTA for AFib s/p fall with hematoma. INR on admission 8.22- given Vitamin K and INR reversed. INR currently 1.43. Hgb   Hgb 9.7, plts 236. No bleeding noted.  Note Dr. Jens Somrenshaw from cardiology saw the patient this morning and recommended resuming warfarin at dose of 2.5mg  daily with an INR check ~3 days after discharge.  Plan: -warfarin 2.5mg  po daily- ordered -daily INR while in the hospital -recommend INR on 12/27 if patient is discharged today  Madeline Serrano D. Madeline Serrano, PharmD, BCPS Clinical Pharmacist Pager: [phone] 09/27/2015 1:03 PM

## 2015-09-27 NOTE — Progress Notes (Signed)
PROGRESS NOTE  Madeline Serrano ZOX:[id number] DOB: September 09, 1930 DOA: 09/24/2015 PCP: Martha ClanShaw, William, MD  HPI/Recap of past 3624 hours: 81 year old female with a history of paroxysmal atrial fibrillation on warfarin, hypertension, hyperlipidemia, diastolic CHF, TIA presented with sudden onset of shortness of breath and chest congestion on the day of admission. Further questioning reveals that the patient has had a cough for the past 7 days with 2 days of shortness of breath. She denies any orthopnea or PND type symptoms. She denies any recent weight gain. Her weight has been stable, and the patient has been compliant with all her medications. She denies any new medications. One day prior to admission, the patient felt lightheaded as she was getting up and trying to put on close and resulted in a mechanical fall without syncope. Review of the medical record reveals that the patient hasn't had a history of orthostatic hypotension. She denies any fevers, chills, chest pain, headache, neck pain, rashes, nausea, vomiting, diarrhea, abdominal pain, dysuria, hematuria. After admission, the patient was given prednisone and intravenous furosemide. CT angiogram of the chest reveals a left posterior chest wall contusion, but was negative for pulmonary embolus.   Patient responded well to diuresis. Breathing comfortably, closer to baseline. Following diuresis, INR now subtherapeutic and Coumadin has been restarted. Patient itself is feeling okay. She feels like her breathing is very close to baseline. No chest pain.  Assessment/Plan: Active Problems:   Hyponatremia: Stable, chronic medical issue   Hypertension: Blood pressure stable   COPD (chronic obstructive pulmonary disease) (HCC): Much improved. Viral URI may have been triggering event.   PAF NSR on Flecanide   Acute on chronic diastolic heart failure (HCC): Mild. With diuresis, only diuresed 1 L   Aortic stenosis   Pulmonary nodule: Incidental finding on chest  CT. Outpatient surveillance.    Pre-syncope: There is a concern about previous falls. Patient is rather alert and oriented. She still species had 4 falls in the past 4 years. She feels like she wants to continue on blood thinners because she is at high risk for TIAs. I would concur, although would recommend to cardiology to perhaps maybe candidate for new or anticoagulation agents    Paroxysmal atrial fibrillation [facility](HCC): Chads 2 score of 6. Coumadin restarted INR subtherapeutic   Orthostatic hypotension: Blood pressure stable. Chronic problem for patient, Coreg decreased and hydralazine discontinued  Gait instability: Home health PT.  Dysphagia: Cleared by speech therapy. Questionable esophageal dysphagia so barium esophagram ordered and is pending  Code Status: DO NOT RESUSCITATE   Family Communication: Left message for sun   Disposition Plan: Anticipate discharge home likely tomorrow, waiting for INR to be therapeutic   Consultants:  Cardiology   Procedures:  Preserved ejection fraction of 50-55 percent. Elevated diastolic filling pressures   Antibiotics:  None    Objective: BP 158/52 mmHg  Pulse 57  Temp(Src) 97.8 F (36.6 C) (Oral)  Resp 20  Ht 5\' 5"  (1.651 m)  Wt 67.903 kg (149 lb 11.2 oz)  BMI 24.91 kg/m2  SpO2 93%  Intake/Output Summary (Last 24 hours) at 09/27/15 1440 Last data filed at 09/27/15 0900  Gross per 24 hour  Intake    840 ml  Output    800 ml  Net     40 ml   Filed Weights   09/25/15 0320 09/26/15 0423 09/27/15 0406  Weight: 66 kg (145 lb 8.1 oz) 65.273 kg (143 lb 14.4 oz) 67.903 kg (149 lb 11.2 oz)  Exam:   General:  Alert and oriented 3, no acute distress   Cardiovascular: Normal sinus rhythm with 2/6 systolic ejection murmur   Respiratory: Clear to auscultation bilaterally   Abdomen: Soft, nontender, nondistended, positive bowel sounds   Musculoskeletal: No edema    Data Reviewed: Basic Metabolic Panel:  Recent Labs Lab  09/24/15 2029 09/25/15 0850 09/26/15 0428 09/27/15 0300  NA 125* 126* 126* 126*  K 4.2 4.3 4.2 4.2  CL 94* 92* 92* 92*  CO2 GLUCOSE 146* 124* 127* 100*  BUN 25*  CREATININE 0.99 1.03* 1.41* 1.27*  CALCIUM 9.1 9.4 9.0 8.8*   Liver Function Tests:  Recent Labs Lab 09/24/15 2029  AST 31  ALT 15  ALKPHOS 97  BILITOT 1.2  PROT 5.6*  ALBUMIN 3.1*   No results for input(s): LIPASE, AMYLASE in the last 168 hours. No results for input(s): AMMONIA in the last 168 hours. CBC:  Recent Labs Lab 09/24/15 2029 09/26/15 0428  WBC 7.5 9.2  NEUTROABS 6.0  --   HGB 9.5* 9.7*  HCT 28.5* 28.7*  MCV 93.1 94.4  PLT 183 236   Cardiac Enzymes:    Recent Labs Lab 09/25/15 0039 09/25/15 0850 09/25/15 1330  TROPONINI <0.03 <0.03 <0.03   BNP (last 3 results)  Recent Labs  10/01/14 1512 09/24/15 2039  BNP 470.0* 1102.4*    ProBNP (last 3 results) No results for input(s): PROBNP in the last 8760 hours.  CBG: No results for input(s): GLUCAP in the last 168 hours.  Recent Results (from the past 240 hour(s))  Urine culture     Status: None   Collection Time: 09/24/15  8:25 PM  Result Value Ref Range Status   Specimen Description URINE, CLEAN CATCH  Final   Special Requests NONE  Final   Culture MULTIPLE SPECIES PRESENT, SUGGEST RECOLLECTION  Final   Report Status 09/26/2015 FINAL  Final     Studies: Dg Esophagus  09/26/2015  CLINICAL DATA:  Dysphagia, acid reflux, occasional vomiting with soft foods. EXAM: ESOPHOGRAM/BARIUM SWALLOW TECHNIQUE: Single contrast examination was performed using thin barium or water soluble. FLUOROSCOPY TIME:  Fluoroscopy Time:  1 minute and 20 seconds. COMPARISON:  None. FINDINGS: The esophagus is normal in caliber without evidence of obstructing masses or filling defects. There is however poor motility with prominent tertiary contractions and spontaneous severe gastroesophageal reflux. There is a small hiatal hernia. A 13  mm barium tablet was administered and passed without delay into the stomach. IMPRESSION: Poor motility of the esophagus with severe spontaneous gastroesophageal reflux. Small hiatal hernia. Electronically Signed   By: Ted Mcalpine M.D.   On: 09/26/2015 19:32    Scheduled Meds: . carvedilol  6.25 mg Oral BID WC  . flecainide  100 mg Oral BID  . guaiFENesin  600 mg Oral BID  . mometasone-formoterol  2 puff Inhalation BID  . multivitamin with minerals  1 tablet Oral Daily  . nitrofurantoin  100 mg Oral Daily  . pravastatin  20 mg Oral q1800  . sodium chloride  3 mL Intravenous Q12H  . tiotropium  18 mcg Inhalation Daily  . warfarin  2.5 mg Oral q1800  . Warfarin - Pharmacist Dosing Inpatient   Does not apply q1800    Continuous Infusions:    Time spent: 15 minutes   Hollice Espy  [facility] Pager [phone] . If 7PM-7AM, please contact night-coverage at [email or web address], password [email or web address] 09/27/2015, 2:40 PM  LOS: 3 days

## 2015-09-28 ENCOUNTER — Inpatient Hospital Stay (HOSPITAL_COMMUNITY): Payer: Medicare Other

## 2015-09-28 DIAGNOSIS — J441 Chronic obstructive pulmonary disease with (acute) exacerbation: Secondary | ICD-10-CM

## 2015-09-28 LAB — BASIC METABOLIC PANEL
Anion gap: 7 (ref 5–15)
BUN: 23 mg/dL — AB (ref 6–20)
CO2: 27 mmol/L (ref 22–32)
CREATININE: 1.14 mg/dL — AB (ref 0.44–1.00)
Calcium: 9.1 mg/dL (ref 8.9–10.3)
Chloride: 94 mmol/L — ABNORMAL LOW (ref 101–111)
GFR calc Af Amer: 50 mL/min — ABNORMAL LOW (ref >60)
GFR, EST NON AFRICAN AMERICAN: 43 mL/min — AB (ref >60)
GLUCOSE: 108 mg/dL — AB (ref 65–99)
Potassium: 4.1 mmol/L (ref 3.5–5.1)
SODIUM: 128 mmol/L — AB (ref 135–145)

## 2015-09-28 LAB — PROTIME-INR
INR: 1.24 (ref 0.00–1.49)
Prothrombin Time: 15.8 seconds — ABNORMAL HIGH (ref 11.6–15.2)

## 2015-09-28 MED ORDER — RIVAROXABAN 15 MG PO TABS
15.0000 mg | ORAL_TABLET | Freq: Every day | ORAL | Status: DC
  Administered 2015-09-28 – 2015-09-29 (×2): 15 mg via ORAL
  Filled 2015-09-28 (×2): qty 1

## 2015-09-28 MED ORDER — LIDOCAINE 5 % EX PTCH
1.0000 | MEDICATED_PATCH | CUTANEOUS | Status: DC
  Administered 2015-09-28 – 2015-09-29 (×2): 1 via TRANSDERMAL
  Filled 2015-09-28 (×2): qty 1

## 2015-09-28 MED ORDER — PANTOPRAZOLE SODIUM 40 MG PO TBEC
40.0000 mg | DELAYED_RELEASE_TABLET | Freq: Two times a day (BID) | ORAL | Status: DC
  Administered 2015-09-28 – 2015-09-30 (×4): 40 mg via ORAL
  Filled 2015-09-28 (×4): qty 1

## 2015-09-28 NOTE — Progress Notes (Addendum)
Pharmacy consulted for warfarin.-> to Xarelto On warfarin PTA for AFib s/p fall with hematoma. INR on admission 8.22- given Vitamin K and INR reversed. INR currently 1.24. Hgb 9.7, plts 236. No bleeding noted.  Note Dr. Jens Somrenshaw from cardiology saw the patient this morning and recommended resuming warfarin at dose of 2.5mg  daily with an INR check ~3 days after discharge.   Plan: -continue warfarin 2.5mg  po daily- ordered -daily INR while in the hospital -recommend INR on 12/27 if patient is discharged today  Thank you Okey RegalLisa Khaleem Burchill, PharmD [phone]  09/28/2015 8:35 AM   Addendum Changing Coumadin to Xarelto CrCl = 35 ml / min --> will give Xarelto 15 mg po daily  Thank you Okey RegalLisa Keith Felten, PharmD [phone]

## 2015-09-28 NOTE — Progress Notes (Signed)
PROGRESS NOTE  Madeline Roenita D Phariss WUJ:[id number] DOB: Jan 19, 1930 DOA: 09/24/2015 PCP: Martha ClanShaw, William, MD  HPI/Recap of past 3524 hours: 81 year old female with a history of paroxysmal atrial fibrillation on warfarin, hypertension, hyperlipidemia, diastolic CHF, TIA presented with sudden onset of shortness of breath and chest congestion on the day of admission. A day or 2 prior, patient had had a mechanical fall landing on her back. Seen by her PCP and x-rays (which are not in the system ) reportedly normal. However with progressively worsening shortness of breath and cough, patient sent to emergency room.  CT angiogram of the chest reveals a left posterior chest wall contusion, but was negative for pulmonary embolus. She was found to be volume overloaded   Patient responded well to diuresis. Breathing comfortably, closer to baseline. Has diuresed over a liter.  Following diuresis, INR now subtherapeutic and Coumadin initially restarted after being seen by cardiology. Patient herself is not feeling well. Continues to have significant amount of pain with movement on the left side of her back mid way down. Today, feels more congested with trouble taking a deep inspiration secondary to pain.  Assessment/Plan: Active Problems:   Hyponatremia: Stable, chronic medical issue   Hypertension: Blood pressure stable   COPD (chronic obstructive pulmonary disease) (HCC): Much improved. Viral URI may have been triggering event. Inspirations limited secondary to pain. Check chest x-ray for follow-up. Have added incentive spirometry   PAF NSR on Flecanide   Acute on chronic diastolic heart failure (HCC): Mild. With diuresis, diuresed a little over 1 L.   Aortic stenosis   Pulmonary nodule: Incidental finding on chest CT. Outpatient surveillance.  Recent fall with large hematoma on back side. Patient does have previous history of these. Recheck hemoglobin in the morning and if drop, will get CT. Patient's blood pressure  stable. If 11 stable, will monitor. The meantime, lidocaine patch for pain control.    Paroxysmal atrial fibrillation [facility](HCC): Chads 2 score of 6. Coumadin initially restarted. Had discussion with patient's son as well as the patient. Falls in general are Concerning, but patient is a very high risk for TIAs. Cardiology recommending continued anticoagulation. Given stable renal function, we'll start xarelto in place of Coumadin.    Orthostatic hypotension: Blood pressure stable. Chronic problem for patient, Coreg decreased and hydralazine discontinued  Gait instability: In the setting of this acute back pain. They're recommending short-term skilled nursing.  Patient amenable, but we'll see once pain better controlled.  Dysphagia: Cleared by speech therapy. Questionable esophageal dysphagia so barium esophagram ordered.  Results note some esophageal dysmotility and severe acid reflux. Have started PPI twice a day  History of knee DJD: Patient missed appointment after coming in the hospital. He is supposed to get a knee injection. We will call Dr. Reva BoresJeffrey's office in the morning and see if someone can come over for injection.  Code Status: DO NOT RESUSCITATE   Family Communication: Son at the bedside  Disposition Plan: Likely here for several more days pending control of pain, PT follow-up, improvement in respiratory function   Consultants:  Cardiology   Procedures:  Preserved ejection fraction of 50-55 percent. Elevated diastolic filling pressures   Antibiotics:  None    Objective: BP 166/52 mmHg  Pulse 55  Temp(Src) 97.9 F (36.6 C) (Oral)  Resp 19  Ht 5\' 5"  (1.651 m)  Wt 67.314 kg (148 lb 6.4 oz)  BMI 24.70 kg/m2  SpO2 97%  Intake/Output Summary (Last 24 hours) at 09/28/15 1703 Last data filed  at 09/28/15 1500  Gross per 24 hour  Intake    360 ml  Output   1250 ml  Net   -890 ml   Filed Weights   09/26/15 0423 09/27/15 0406 09/28/15 0612  Weight: 65.273 kg (143 lb  14.4 oz) 67.903 kg (149 lb 11.2 oz) 67.314 kg (148 lb 6.4 oz)    Exam:   General:  Alert and oriented 3, moderate distress secondary to pain  Cardiovascular: Normal sinus rhythm with 2/6 systolic ejection murmur   Respiratory: Scattered rales, poor inspiratory effort   Abdomen: Soft, nontender, nondistended, positive bowel sounds   Back: On left side midway down, fair sized hematoma with fluid collection measuring approximately 5 inches across  Musculoskeletal: No edema    Data Reviewed: Basic Metabolic Panel:  Recent Labs Lab 09/24/15 2029 09/25/15 0850 09/26/15 0428 09/27/15 0300 09/28/15 0404  NA 125* 126* 126* 126* 128*  K 4.2 4.3 4.2 4.2 4.1  CL 94* 92* 92* 92* 94*  CO2 GLUCOSE 146* 124* 127* 100* 108*  BUN 25* 23*  CREATININE 0.99 1.03* 1.41* 1.27* 1.14*  CALCIUM 9.1 9.4 9.0 8.8* 9.1   Liver Function Tests:  Recent Labs Lab 09/24/15 2029  AST 31  ALT 15  ALKPHOS 97  BILITOT 1.2  PROT 5.6*  ALBUMIN 3.1*   No results for input(s): LIPASE, AMYLASE in the last 168 hours. No results for input(s): AMMONIA in the last 168 hours. CBC:  Recent Labs Lab 09/24/15 2029 09/26/15 0428  WBC 7.5 9.2  NEUTROABS 6.0  --   HGB 9.5* 9.7*  HCT 28.5* 28.7*  MCV 93.1 94.4  PLT 183 236   Cardiac Enzymes:    Recent Labs Lab 09/25/15 0039 09/25/15 0850 09/25/15 1330  TROPONINI <0.03 <0.03 <0.03   BNP (last 3 results)  Recent Labs  10/01/14 1512 09/24/15 2039  BNP 470.0* 1102.4*    ProBNP (last 3 results) No results for input(s): PROBNP in the last 8760 hours.  CBG: No results for input(s): GLUCAP in the last 168 hours.  Recent Results (from the past 240 hour(s))  Urine culture     Status: None   Collection Time: 09/24/15  8:25 PM  Result Value Ref Range Status   Specimen Description URINE, CLEAN CATCH  Final   Special Requests NONE  Final   Culture MULTIPLE SPECIES PRESENT, SUGGEST RECOLLECTION  Final   Report Status  09/26/2015 FINAL  Final     Studies: No results found.  Scheduled Meds: . carvedilol  6.25 mg Oral BID WC  . flecainide  100 mg Oral BID  . guaiFENesin  600 mg Oral BID  . lidocaine  1 patch Transdermal Q24H  . mometasone-formoterol  2 puff Inhalation BID  . multivitamin with minerals  1 tablet Oral Daily  . nitrofurantoin  100 mg Oral Daily  . pravastatin  20 mg Oral q1800  . rivaroxaban  15 mg Oral Q supper  . sodium chloride  3 mL Intravenous Q12H  . tiotropium  18 mcg Inhalation Daily    Continuous Infusions:    Time spent: 45 minutes   Hollice Espy  [facility] Pager [phone] . If 7PM-7AM, please contact night-coverage at [email or web address], password [facility] 09/28/2015, 5:03 PM  LOS: 4 days

## 2015-09-29 ENCOUNTER — Inpatient Hospital Stay (HOSPITAL_COMMUNITY): Payer: Medicare Other

## 2015-09-29 DIAGNOSIS — R1310 Dysphagia, unspecified: Secondary | ICD-10-CM | POA: Insufficient documentation

## 2015-09-29 DIAGNOSIS — S300XXD Contusion of lower back and pelvis, subsequent encounter: Secondary | ICD-10-CM

## 2015-09-29 DIAGNOSIS — S300XXA Contusion of lower back and pelvis, initial encounter: Secondary | ICD-10-CM | POA: Diagnosis present

## 2015-09-29 DIAGNOSIS — R1314 Dysphagia, pharyngoesophageal phase: Secondary | ICD-10-CM

## 2015-09-29 DIAGNOSIS — R911 Solitary pulmonary nodule: Secondary | ICD-10-CM

## 2015-09-29 DIAGNOSIS — R1319 Other dysphagia: Secondary | ICD-10-CM | POA: Insufficient documentation

## 2015-09-29 DIAGNOSIS — I509 Heart failure, unspecified: Secondary | ICD-10-CM | POA: Insufficient documentation

## 2015-09-29 DIAGNOSIS — E871 Hypo-osmolality and hyponatremia: Secondary | ICD-10-CM

## 2015-09-29 LAB — BASIC METABOLIC PANEL
Anion gap: 7 (ref 5–15)
BUN: 20 mg/dL (ref 6–20)
CALCIUM: 9.1 mg/dL (ref 8.9–10.3)
CO2: 27 mmol/L (ref 22–32)
CREATININE: 1.15 mg/dL — AB (ref 0.44–1.00)
Chloride: 95 mmol/L — ABNORMAL LOW (ref 101–111)
GFR, EST AFRICAN AMERICAN: 50 mL/min — AB (ref >60)
GFR, EST NON AFRICAN AMERICAN: 43 mL/min — AB (ref >60)
GLUCOSE: 139 mg/dL — AB (ref 65–99)
Potassium: 4.5 mmol/L (ref 3.5–5.1)
Sodium: 129 mmol/L — ABNORMAL LOW (ref 135–145)

## 2015-09-29 LAB — CBC
HEMATOCRIT: 28.1 % — AB (ref 36.0–46.0)
Hemoglobin: 9.1 g/dL — ABNORMAL LOW (ref 12.0–15.0)
MCH: 30.4 pg (ref 26.0–34.0)
MCHC: 32.4 g/dL (ref 30.0–36.0)
MCV: 94 fL (ref 78.0–100.0)
PLATELETS: 212 10*3/uL (ref 150–400)
RBC: 2.99 MIL/uL — ABNORMAL LOW (ref 3.87–5.11)
RDW: 14.4 % (ref 11.5–15.5)
WBC: 6.9 10*3/uL (ref 4.0–10.5)

## 2015-09-29 MED ORDER — LIDOCAINE 5 % EX PTCH: 1.0000 | MEDICATED_PATCH | CUTANEOUS | Status: DC

## 2015-09-29 MED ORDER — WARFARIN - PHARMACIST DOSING INPATIENT: Freq: Every day | Status: DC

## 2015-09-29 MED ORDER — CARVEDILOL 6.25 MG PO TABS
6.2500 mg | ORAL_TABLET | Freq: Two times a day (BID) | ORAL | Status: DC
  Administered 2015-09-30: 6.25 mg via ORAL
  Filled 2015-09-29: qty 1

## 2015-09-29 MED ORDER — TRAMADOL HCL 50 MG PO TABS
100.0000 mg | ORAL_TABLET | Freq: Four times a day (QID) | ORAL | Status: DC | PRN
  Administered 2015-09-29 – 2015-09-30 (×3): 100 mg via ORAL
  Filled 2015-09-29 (×3): qty 2

## 2015-09-29 NOTE — NC FL2 (Signed)
[place] MEDICAID FL2 LEVEL OF CARE SCREENING TOOL     IDENTIFICATION  Patient Name: Madeline Serrano Birthdate: [birth year] Sex: female Admission Date (Current Location): 09/24/2015  [facility] [place] and [place] Number:      Facility and Address:  The . [facility], [address], [place], [place] [address]      Provider Number: [id number]  Attending Physician Name and Address:  Rodolph Bong, MD  Relative Name and Phone Number:       Current Level of Care: Hospital Recommended Level of Care: Skilled Nursing Facility Prior Approval Number:    Date Approved/Denied:   PASRR Number: [id number]  Discharge Plan: SNF    Current Diagnoses: Patient Active Problem List   Diagnosis Date Noted  . Traumatic hematoma of lower back 09/29/2015  . CHF exacerbation (HCC) 09/25/2015  . Paroxysmal atrial fibrillation (HCC) 09/25/2015  . Orthostatic hypotension 09/25/2015  . Chronic diastolic CHF (congestive heart failure) (HCC) 09/25/2015  . Wrist pain   . Pulmonary nodule 09/24/2015  . Pre-syncope 09/24/2015  . Fall 12/18/2014  . Aortic stenosis 06/28/2014  . Aortic insufficiency 06/28/2014  . Mitral regurgitation 06/28/2014  . Ejection fraction   . Chronic diastolic heart failure (HCC) 05/27/2014  . Chest discomfort 05/27/2014  . Acute on chronic diastolic heart failure (HCC) 05/27/2014  . PAF NSR on Flecanide   . High cholesterol   . Chronic anticoagulation   . Speech abnormality 09/07/2012  . Dehydration 09/07/2012  . Orthostasis 08/27/2012  . TIA (transient ischemic attack) 08/24/2012  . COPD (chronic obstructive pulmonary disease) (HCC) 07/05/2012  . Acute blood loss anemia 07/03/2012  . Urinary retention 07/03/2012  . Hip fracture, right (HCC) 06/29/2012  . Hyponatremia 06/29/2012  . Osteoporosis 06/29/2012  . Hypertension 06/29/2012    Orientation RESPIRATION BLADDER Height & Weight    Self, Time, Situation, Place  Normal Continent  (165.1  cm) 144 lbs.  BEHAVIORAL SYMPTOMS/MOOD NEUROLOGICAL BOWEL NUTRITION STATUS  Other (Comment) (n/a)  (n/a) Continent Diet (heart healthy. 1500 mL fluid restriction)  AMBULATORY STATUS COMMUNICATION OF NEEDS Skin   Limited Assist Verbally Bruising                       Personal Care Assistance Level of Assistance  Bathing, Feeding, Dressing Bathing Assistance: Limited assistance Feeding assistance: Limited assistance Dressing Assistance: Limited assistance     Functional Limitations Info  Sight, Hearing, Speech Sight Info: Adequate Hearing Info: Impaired Speech Info: Adequate    SPECIAL CARE FACTORS FREQUENCY  OT (By licensed OT)     PT Frequency: 5 OT Frequency: 5            Contractures      Additional Factors Info  Code Status, Allergies Code Status Info: DNR Allergies Info: Chlorthalidone, Sulfa Antibiotics, Vioxx, Celebrex, Clarithromycin, Statins, Other, Penicillins           Current Medications (09/29/2015):  This is the current hospital active medication list Current Facility-Administered Medications  Medication Dose Route Frequency Provider Last Rate Last Dose  . 0.9 %  sodium chloride infusion  250 mL Intravenous PRN Therisa Doyne, MD      . acetaminophen (TYLENOL) tablet 650 mg  650 mg Oral Q4H PRN Therisa Doyne, MD   650 mg at 09/29/15 1017  . albuterol (PROVENTIL) (2.5 MG/3ML) 0.083% nebulizer solution 2.5 mg  2.5 mg Nebulization Q4H PRN Therisa Doyne, MD      . carvedilol (COREG) tablet 6.25 mg  6.25  mg Oral BID WC Catarina Hartshornavid Tat, MD   6.25 mg at 09/29/15 [id number]  . flecainide (TAMBOCOR) tablet 100 mg  100 mg Oral BID Therisa DoyneAnastassia Doutova, MD   100 mg at 09/29/15 1040  . guaiFENesin (MUCINEX) 12 hr tablet 600 mg  600 mg Oral BID Therisa DoyneAnastassia Doutova, MD   600 mg at 09/29/15 1040  . ipratropium (ATROVENT) nebulizer solution 0.5 mg  0.5 mg Nebulization Q4H PRN Catarina Hartshornavid Tat, MD      . lidocaine (LIDODERM) 5 % 1 patch  1 patch Transdermal Q24H Hollice EspySendil K  Krishnan, MD   1 patch at 09/28/15 1430  . mometasone-formoterol (DULERA) 100-5 MCG/ACT inhaler 2 puff  2 puff Inhalation BID Therisa DoyneAnastassia Doutova, MD   2 puff at 09/29/15 0757  . multivitamin with minerals tablet 1 tablet  1 tablet Oral Daily [place] Senateeanne J Barbato, RD   1 tablet at 09/29/15 1040  . nitrofurantoin (MACRODANTIN) capsule 100 mg  100 mg Oral Daily Therisa DoyneAnastassia Doutova, MD   100 mg at 09/29/15 1040  . ondansetron (ZOFRAN) injection 4 mg  4 mg Intravenous Q6H PRN Therisa DoyneAnastassia Doutova, MD      . pantoprazole (PROTONIX) EC tablet 40 mg  40 mg Oral BID Hollice EspySendil K Krishnan, MD   40 mg at 09/29/15 1040  . pravastatin (PRAVACHOL) tablet 20 mg  20 mg Oral q1800 Therisa DoyneAnastassia Doutova, MD   20 mg at 09/28/15 1704  . Rivaroxaban (XARELTO) tablet 15 mg  15 mg Oral Q supper Hollice EspySendil K Krishnan, MD   15 mg at 09/28/15 1637  . sodium chloride 0.9 % injection 3 mL  3 mL Intravenous Q12H Therisa DoyneAnastassia Doutova, MD   3 mL at 09/29/15 1000  . sodium chloride 0.9 % injection 3 mL  3 mL Intravenous PRN Therisa DoyneAnastassia Doutova, MD      . tiotropium (SPIRIVA) inhalation capsule 18 mcg  18 mcg Inhalation Daily Catarina Hartshornavid Tat, MD   18 mcg at 09/29/15 0757  . traMADol (ULTRAM) tablet 100 mg  100 mg Oral Q6H PRN Rodolph Bonganiel Thompson V, MD   100 mg at 09/29/15 1226     Discharge Medications: Please see discharge summary for a list of discharge medications.  Relevant Imaging Results:  Relevant Lab Results:   Additional Information    Karn CassisStultz, Alajia Schmelzer [place], [place] [phone]

## 2015-09-29 NOTE — Progress Notes (Signed)
Physical Therapy Treatment Patient Details Name: Madeline Serrano MRN: [id number] DOB: Mar 02, 1930 Today's Date: 09/29/2015    History of Present Illness Patient is an 81 y/o female with  past medical history of PAF (paroxysmal atrial fibrillation) (HCC); Hypertension; High cholesterol; Macular degeneration; GERD (gastroesophageal reflux disease); TIA (transient ischemic attack); DJD (degenerative joint disease) (10/13); Chronic anticoagulation; Aortic valve stenosis, mild (11/13); Ejection fraction; and Diastolic heart failure (HCC).  Reports feeling short of breath at baseline due to COPD. She has no been feeling well. She was supposed to have a PCP appointment and fell as was trying to get ready because she felt lightheaded.  Admitted with CHF exacerbation.    PT Comments    Patient continued to be limited in mobility and independence, at least in part due to dizziness and lightheadedness.  Feel patient will require inpatient post-acute stay to reach max potential for hopeful eventual discharge home.  Recommending SNF at this time.  Will benefit from continued PT to progress mobility.   Follow Up Recommendations  SNF     Equipment Recommendations  None recommended by PT    Recommendations for Other Services       Precautions / Restrictions Precautions Precautions: Fall    Mobility  Bed Mobility Overal bed mobility: Needs Assistance Bed Mobility: Supine to Sit     Supine to sit: Min assist     General bed mobility comments: pt requires (A) to exit the bed and bed rail used.  Transfers Overall transfer level: Needs assistance Equipment used: Rolling walker (2 wheeled) Transfers: Sit to/from Stand Sit to Stand: Min assist         General transfer comment: min assist for safety,  Ambulation/Gait Ambulation/Gait assistance: Min assist Ambulation Distance (Feet): 25 Feet Assistive device: Rolling walker (2 wheeled) Gait Pattern/deviations: Step-through pattern;Decreased  stride length Gait velocity: decreased   General Gait Details: ambulation limited due to patient reporting she felt dizzy and lightheaded.  She thought related to new medication.   Stairs            Wheelchair Mobility    Modified Rankin (Stroke Patients Only)       Balance Overall balance assessment: Needs assistance Sitting-balance support: No upper extremity supported Sitting balance-Leahy Scale: Fair     Standing balance support: Bilateral upper extremity supported Standing balance-Leahy Scale: Poor Standing balance comment: required RW for standing balance                    Cognition Arousal/Alertness: Awake/alert Behavior During Therapy: WFL for tasks assessed/performed Overall Cognitive Status: Within Functional Limits for tasks assessed                      Exercises      General Comments        Pertinent Vitals/Pain Pain Assessment: Faces Faces Pain Scale: Hurts little more Pain Location: back Pain Intervention(s): Monitored during session;Limited activity within patient's tolerance    Home Living                      Prior Function            PT Goals (current goals can now be found in the care plan section) Progress towards PT goals: Not progressing toward goals - comment (continues to be limited by dizziness)    Frequency  Min 3X/week    PT Plan Current plan remains appropriate    Co-evaluation  End of Session Equipment Utilized During Treatment: Gait belt Activity Tolerance: Treatment limited secondary to medical complications (Comment) (feeling dizzy and lightheaded) Patient left: in bed;with call bell/phone within reach     Time: 1410-1430 PT Time Calculation (min) (ACUTE ONLY): 20 min  Charges:  $Gait Training: 8-22 mins                    G Codes:      Olivia Canter 2015/10/05, 2:36 PM  10/05/15 Corlis Hove, PT [phone]

## 2015-09-29 NOTE — Progress Notes (Signed)
Patient ID: Madeline Serrano, female   DOB: 12-Dec-1929, 81 y.o.   MRN: [id number]    Patient ID: Madeline Serrano MRN: [id number] DOB/AGE: 02/27/1930 81 y.o.  Admit date: 09/24/2015  Admission Diagnoses:  Active Problems:   Hyponatremia   Hypertension   COPD (chronic obstructive pulmonary disease) (HCC)   PAF NSR on Flecanide   Acute on chronic diastolic heart failure (HCC)   Aortic stenosis   Pulmonary nodule   Pre-syncope   CHF exacerbation (HCC)   Paroxysmal atrial fibrillation (HCC)   Orthostatic hypotension   Chronic diastolic CHF (congestive heart failure) (HCC)   Traumatic hematoma of lower back   HPI: Very pleasant 81 year old female pt.  She reports falling flat on her back at her home on 09/23/15 which resulted in significant echymosis  And hematoma of her left posterior thorax.  She reports it was the CHF and SOB that brought her into the ED.  The pt is on chronic coumadin treatment.   Past Medical History: Past Medical History  Diagnosis Date  . PAF (paroxysmal atrial fibrillation) (HCC)     NSR on Flecanide  . Hypertension     labile  . High cholesterol   . Macular degeneration   . GERD (gastroesophageal reflux disease)   . TIA (transient ischemic attack)     several in past  . DJD (degenerative joint disease) 10/13    s/p rt THR  . Chronic anticoagulation   . Aortic valve stenosis, mild 11/13  . Ejection fraction   . Diastolic heart failure [facility])     Surgical History: Past Surgical History  Procedure Laterality Date  . Hip arthroplasty  06/30/2012    Procedure: ARTHROPLASTY BIPOLAR HIP;  Surgeon: Shelda Pal, MD;  Location: [facility] OR;  Service: Orthopedics;  Laterality: Right;  right hip hemiarthroplasty  . Carotid duplex  03/20/2012    Bilateral Bulb/Proximal ICAs-mild plaque, mildly abnormal scan  . Cardiovascular stress test  10/13/2010    Normal study, no ECG changes  . Transthoracic echocardiogram  08/25/2012    EF 60-65%, moderately calcified leaflets  of the aortic valve, LA moderately dilated,    Family History: Family History  Problem Relation Age of Onset  . Emphysema Mother   . Diabetes Father   . Heart attack Father 53  . Stroke Maternal Grandfather   . Heart disease Paternal Grandfather   . Heart disease Paternal Grandmother   . Cancer Son     testicular x3  . Pancreatitis Son   . Diabetes Son     Borderline    Social History: Social History   Social History  . Marital Status: Widowed    Spouse Name: N/A  . Number of Children: N/A  . Years of Education: N/A   Occupational History  . Not on file.   Social History Main Topics  . Smoking status: Never Smoker   . Smokeless tobacco: Never Used  . Alcohol Use: No  . Drug Use: No  . Sexual Activity: Not on file   Other Topics Concern  . Not on file   Social History Narrative    Allergies: Chlorthalidone; Sulfa antibiotics; Vioxx; Celebrex; Clarithromycin; Statins; Other; and Penicillins  Medications: I have reviewed the patient's current medications.  Vital Signs: Patient Vitals for the past 24 hrs:  BP Temp Temp src Pulse Resp SpO2 Weight  09/29/15 0822 (!) 126/54 mmHg - - (!) 58 - - -  09/29/15 0758 - - - - - 98 % -  09/29/15 0434 (!) 134/49 mmHg 98 F (36.7 C) Oral 60 18 97 % 65.59 kg (144 lb 9.6 oz)  09/28/15 2110 (!) 143/43 mmHg 98.5 F (36.9 C) Oral (!) 57 18 99 % -  09/28/15 2015 - - - - - 97 % -  09/28/15 1600 (!) 150/52 mmHg - - (!) 57 - - -    Radiology: Dg Chest 2 View  09/24/2015  CLINICAL DATA:  Cough and shortness of breath for 1 day EXAM: CHEST  2 VIEW COMPARISON:  12/17/2014 FINDINGS: There is a 7 mm nodular opacity in the right upper lobe. There is bilateral interstitial thickening. There are bilateral small pleural effusions. There is prominence of the central pulmonary vasculature. There is no pneumothorax. The heart and mediastinal contours are unremarkable. There is severe osteoarthritis of the left glenohumeral joint.  IMPRESSION: 1. Bilateral interstitial thickening and bilateral pleural effusions concerning for mild pulmonary edema. 2. 7 mm nodular opacity in the right upper lobe. Recommend further evaluation with a nonemergent CT of the chest. Electronically Signed   By: Elige KoHetal  Patel   On: 09/24/2015 20:58   Dg Thoracic Spine 2 View  09/29/2015  CLINICAL DATA:  Fall.  Hematoma left back.  Pain. EXAM: THORACIC SPINE 2 VIEWS COMPARISON:  Chest CT 09/25/2015 FINDINGS: Mild superior endplate deformity of T5 is unchanged from the CT and is probably chronic. No new fracture. Dextroscoliosis. Mild thoracic disc degeneration and spurring in the lower thoracic spine. IMPRESSION: Mild fracture of T5 is unchanged and probably chronic. No acute abnormality. Electronically Signed   By: Marlan Palauharles  Clark M.D.   On: 09/29/2015 15:45   Dg Lumbar Spine Complete  09/29/2015  CLINICAL DATA:  Fall.  Back pain EXAM: LUMBAR SPINE - COMPLETE 4+ VIEW COMPARISON:  CT abdomen pelvis 08/31/2006 FINDINGS: Negative for fracture.  Negative for pars defect.  No mass lesion. Mild anterior slip L5-S1 related to disc and facet degeneration. Multilevel disc degeneration throughout the lumbar spine. Mild levoscoliosis. Prominent right-sided osteophytes at L2-3 and L3-4. IMPRESSION: Scoliosis and degenerative change.  Negative for fracture. Electronically Signed   By: Marlan Palauharles  Clark M.D.   On: 09/29/2015 15:47   Dg Wrist 2 Views Right  09/25/2015  CLINICAL DATA:  Wrist pain after fall.  Initial encounter. EXAM: RIGHT WRIST - 2 VIEW COMPARISON:  12/17/2014 FINDINGS: No evidence of acute fracture or dislocation. Advanced first CMC osteoarthritis with subluxation, fragmentation, and metacarpal base cystic change. Osteopenia and chondrocalcinosis. IMPRESSION: No acute finding. Electronically Signed   By: Marnee SpringJonathon  Watts M.D.   On: 09/25/2015 01:03   Ct Angio Chest Pe W/cm &/or Wo Cm  09/25/2015  CLINICAL DATA:  Dyspnea.  Fall today with back pain EXAM: CT  ANGIOGRAPHY CHEST WITH CONTRAST TECHNIQUE: Multidetector CT imaging of the chest was performed using the standard protocol during bolus administration of intravenous contrast. Multiplanar CT image reconstructions and MIPs were obtained to evaluate the vascular anatomy. CONTRAST:  100mL OMNIPAQUE IOHEXOL 350 MG/ML SOLN COMPARISON:  None. FINDINGS: THORACIC INLET/BODY WALL: Subcutaneous reticulation along the left posterior chest wall compatible with contusion. 1 cm right thyroid nodule which is considered incidental. MEDIASTINUM: Normal heart size. No pericardial effusion. No evidence of acute vascular abnormality, including pulmonary embolism or aortic dissection. Extensive atherosclerosis. No adenopathy. LUNG WINDOWS: Mosaic attenuation lungs which is likely air trapping. There is extensive airways debris with layering in the bilateral mainstem bronchus. Dependent atelectasis with small layering effusions. Spiculated 8 mm pulmonary nodule in the left upper lobe. UPPER ABDOMEN:  No acute findings. OSSEOUS: Severe glenohumeral osteoarthritis on the left with joint effusion and secondary osteochondromatosis. Review of the MIP images confirms the above findings. IMPRESSION: 1. No evidence of pulmonary embolism. 2. Partly visualized left posterior chest wall contusion. 3. Extensive airway debris, question aspiration. 4. Small bilateral pleural effusion and mild atelectasis. 5. 8 mm spiculated nodule in the left upper lobe. Follow-up chest CT at 3-51months is recommended. Electronically Signed   By: Marnee Spring M.D.   On: 09/25/2015 02:06   Dg Esophagus  09/26/2015  CLINICAL DATA:  Dysphagia, acid reflux, occasional vomiting with soft foods. EXAM: ESOPHOGRAM/BARIUM SWALLOW TECHNIQUE: Single contrast examination was performed using thin barium or water soluble. FLUOROSCOPY TIME:  Fluoroscopy Time:  1 minute and 20 seconds. COMPARISON:  None. FINDINGS: The esophagus is normal in caliber without evidence of  obstructing masses or filling defects. There is however poor motility with prominent tertiary contractions and spontaneous severe gastroesophageal reflux. There is a small hiatal hernia. A 13 mm barium tablet was administered and passed without delay into the stomach. IMPRESSION: Poor motility of the esophagus with severe spontaneous gastroesophageal reflux. Small hiatal hernia. Electronically Signed   By: Ted Mcalpine M.D.   On: 09/26/2015 19:32   Dg Chest Port 1 View  09/28/2015  CLINICAL DATA:  Dyspnea, hypertension, gastroesophageal reflux, heart failure EXAM: PORTABLE CHEST 1 VIEW COMPARISON:  09/25/2015, 09/24/2015 FINDINGS: Low lung volumes evident with minor basilar atelectasis. Mild cardiomegaly without CHF. No focal pneumonia, collapse, or consolidation. No enlarging effusion or pneumothorax. Trachea midline. Atherosclerosis of aorta. Degenerative changes of the spine with an associated scoliosis. Advanced left shoulder arthropathy evident. IMPRESSION: Low lung volumes and basilar atelectasis. Atherosclerosis and degenerative changes as above. Electronically Signed   By: Judie Petit.  Shick M.D.   On: 09/28/2015 18:11    Labs:  Recent Labs  09/29/15 0610  WBC 6.9  RBC 2.99*  HCT 28.1*  PLT 212    Recent Labs  09/28/15 0404 09/29/15 0840  NA 128* 129*  K 4.1 4.5  CL 94* 95*  CO2 27 27  BUN 23* 20  CREATININE 1.14* 1.15*  GLUCOSE 108* 139*  CALCIUM 9.1 9.1    Recent Labs  09/27/15 0300 09/28/15 0404  INR 1.43 1.24    Review of Systems: Review of Systems  Eyes: Positive for discharge.    Physical Exam: Neurologically intact ABD soft Sensation intact distally No cellulitis present  Large ecchymosis present on the pt left posterior flank Hematoma present - palpable to touch Tenderness to palpation  Assessment and Plan: Pt is concerned about getting her final Euflexxa injection by Dr. Juliene Pina.  She has gotten 2 so far.  Advised pt the rehab facility could  transport her for office visits. Pt has been told she will be discharged to rehab faciility Continue to monitor hematoma/ecchymosis   Anette Riedel, PA-C [facility] [phone]  Agree with above Patient has been in hospital for several days without significant issue with hematoma Recommend Radiation Oncology consult for possible XRT to prevent heterotopic bone formation Continue ice and compression dressing No need for acute surgical drainage Will inform Dr Darrelyn Hillock about patient and see if he or his PA can perform viscosupplement injection

## 2015-09-29 NOTE — Progress Notes (Signed)
Occupational Therapy Treatment Patient Details Name: Madeline Serrano MRN: [id number] DOB: Apr 02, 1930 Today's Date: 09/29/2015    History of present illness Patient is an 81 y/o female with  past medical history of PAF (paroxysmal atrial fibrillation) (HCC); Hypertension; High cholesterol; Macular degeneration; GERD (gastroesophageal reflux disease); TIA (transient ischemic attack); DJD (degenerative joint disease) (10/13); Chronic anticoagulation; Aortic valve stenosis, mild (11/13); Ejection fraction; and Diastolic heart failure (HCC).  Reports feeling short of breath at baseline due to COPD. She has no been feeling well. She was supposed to have a PCP appointment and fell as was trying to get ready because she felt lightheaded.  Admitted with CHF exacerbation.   OT comments  Pt demonstrates decr ability to exit the bed with bed rail and hob elevated. Pt requires (A) and pt verbalized concern for d/c home. Pt limited this session by back pain. Pt repositioned in chair at the end of session. Rn informed of patients request for medication.    Follow Up Recommendations  SNF    Equipment Recommendations  3 in 1 bedside comode;Other (comment) (RW)    Recommendations for Other Services      Precautions / Restrictions Precautions Precautions: Fall       Mobility Bed Mobility Overal bed mobility: Needs Assistance Bed Mobility: Supine to Sit     Supine to sit: Min assist     General bed mobility comments: pt requires (A) to exit the bed and bed rail used. pt states "i can't go home like this. I can't get out of my bed"  Transfers Overall transfer level: Needs assistance Equipment used: Rolling walker (2 wheeled) Transfers: Sit to/from Stand Sit to Stand: Min assist         General transfer comment: needs (A) for RW     Balance Overall balance assessment: Needs assistance           Standing balance-Leahy Scale: Poor                     ADL Overall ADL's : Needs  assistance/impaired     Grooming: Oral care;Minimal assistance;Standing Grooming Details (indicate cue type and reason): pt very fast to terminate task and required sitting due to back pain                 Toilet Transfer: Minimal assistance;Stand-pivot;BSC;RW Toilet Transfer Details (indicate cue type and reason): pt requires min (A) to navigate RW to pivot to 3n1 Toileting- Clothing Manipulation and Hygiene: Minimal assistance Toileting - Clothing Manipulation Details (indicate cue type and reason): incr time and effort for peri care. Pt with void in bed and unawareness     Functional mobility during ADLs: Minimal assistance General ADL Comments: Pt ambulated with RW to sink level with incr time       Vision                     Perception     Praxis      Cognition   Behavior During Therapy: Flat affect Overall Cognitive Status: Impaired/Different from baseline Area of Impairment: Memory     Memory: Decreased short-term memory          General Comments: pt asking repeat questions and no recall of previously asking therapist. Pt is very [facility] however patient repeating information back to therapit to confirm hearing clearly. After restating information later in session unable to recall.     Extremity/Trunk Assessment  Exercises     Shoulder Instructions       General Comments      Pertinent Vitals/ Pain       Pain Assessment: Faces Faces Pain Scale: Hurts worst Pain Location: back pain Pain Descriptors / Indicators: Crushing Pain Intervention(s): Monitored during session;Repositioned;Patient requesting pain meds-RN notified  Home Living                                          Prior Functioning/Environment              Frequency Min 2X/week     Progress Toward Goals  OT Goals(current goals can now be found in the care plan section)  Progress towards OT goals: Not progressing toward goals - comment  (limited by Pain)  Acute Rehab OT Goals Patient Stated Goal: to get pain under control to go home OT Goal Formulation: With patient Time For Goal Achievement: 10/09/15 Potential to Achieve Goals: Good ADL Goals Pt Will Perform Grooming: with supervision;standing Pt Will Perform Lower Body Bathing: with supervision;sit to/from stand Pt Will Perform Lower Body Dressing: with supervision;sit to/from stand Pt Will Transfer to Toilet: with supervision;ambulating;regular height toilet;bedside commode;grab bars Pt Will Perform Toileting - Clothing Manipulation and hygiene: with supervision;sit to/from stand  Plan Discharge plan remains appropriate    Co-evaluation                 End of Session Equipment Utilized During Treatment: Rolling walker   Activity Tolerance Patient limited by pain   Patient Left in chair;with call bell/phone within reach   Nurse Communication Mobility status;Precautions        Time: [id number] OT Time Calculation (min): 16 min  Charges: OT General Charges $OT Visit: 1 Procedure OT Treatments $Self Care/Home Management : 8-22 mins  Boone MasterJones, Amayah Staheli B 09/29/2015, 10:03 AM  Mateo FlowJones, Brynn   OTR/L Pager: [phone]: [phone] Office: [phone] .

## 2015-09-29 NOTE — Progress Notes (Signed)
ANTICOAGULATION CONSULT NOTE - Initial Consult  Pharmacy Consult for Coumadin Indication: atrial fibrillation  Allergies  Allergen Reactions  . Chlorthalidone Nausea Only    Lightheadedness, weakness, so "sick feeling"  . Sulfa Antibiotics Other (See Comments)    [name]  . Vioxx [Rofecoxib] Other (See Comments)    Renal problems  . Celebrex [Celecoxib] Swelling  . Clarithromycin Swelling  . Statins Other (See Comments)    Hair fell out  . Other Other (See Comments)    EXTENDED RELEASE tablets  . Penicillins Rash    Patient Measurements: Height:  (165.1 cm) Weight: 144 lb 9.6 oz (65.59 kg) (a scale) IBW/kg (Calculated) : 57  Vital Signs: BP: 142/71 mmHg (12/26 1702) Pulse Rate: 62 (12/26 1702)  Labs:  Recent Labs  09/27/15 0300 09/28/15 0404 09/29/15 0610 09/29/15 0840  HGB  --   --  9.1*  --   HCT  --   --  28.1*  --   PLT  --   --  212  --   LABPROT 17.5* 15.8*  --   --   INR 1.43 1.24  --   --   CREATININE 1.27* 1.14*  --  1.15*    Estimated Creatinine Clearance: 33.4 mL/min (by C-G formula based on Cr of 1.15).   Medical History: Past Medical History  Diagnosis Date  . PAF (paroxysmal atrial fibrillation) (HCC)     NSR on Flecanide  . Hypertension     labile  . High cholesterol   . Macular degeneration   . GERD (gastroesophageal reflux disease)   . TIA (transient ischemic attack)     several in past  . DJD (degenerative joint disease) 10/13    s/p rt THR  . Chronic anticoagulation   . Aortic valve stenosis, mild 11/13  . Ejection fraction   . Diastolic heart failure (HCC)     Medications:  Prescriptions prior to admission  Medication Sig Dispense Refill Last Dose  . CALCIUM PO Take 1 tablet by mouth 3 (three) times daily.    09/24/2015 at Unknown time  . carvedilol (COREG) 12.5 MG tablet Take 25 mg by mouth 2 (two) times daily with a meal.    09/24/2015 at 1800  . fish oil-omega-3 fatty acids 1000 MG capsule Take 1 g by  mouth daily.   09/24/2015 at Unknown time  . flecainide (TAMBOCOR) 100 MG tablet Take 1 tablet (100 mg total) by mouth 2 (two) times daily. 180 tablet 2 09/24/2015 at pm  . furosemide (LASIX) 20 MG tablet Take 1 tablet (20 mg total) by mouth daily as needed. For weight gain of 3 pounds or more. 30 tablet 1 unknown  . hydrALAZINE (APRESOLINE) 25 MG tablet Take 50 mg by mouth 2 (two) times daily. If Bp elevated takes additional dose  0 09/24/2015 at Unknown time  . lansoprazole (PREVACID) 15 MG capsule Take 15 mg by mouth daily as needed (reflux).    Past Month at Unknown time  . Multiple Vitamin (MULTIVITAMIN WITH MINERALS) TABS tablet Take 1 tablet by mouth daily.   09/24/2015 at Unknown time  . Multiple Vitamins-Minerals (ICAPS AREDS 2) CAPS Take 2 capsules by mouth 2 (two) times daily. WITH LUTEIN AND ZEAXANTHIN   09/24/2015 at Unknown time  . nitrofurantoin (MACRODANTIN) 100 MG capsule Take 100 mg by mouth daily.    09/24/2015 at Unknown time  . nitroGLYCERIN (NITROSTAT) 0.4 MG SL tablet Place 1 tablet (0.4 mg total) under the tongue every 5 (five)  minutes as needed for chest pain. 25 tablet 12 not used  . Pitavastatin Calcium (LIVALO) 1 MG TABS Take 0.5 tablets (0.5 mg total) by mouth daily. 30 tablet 11 09/24/2015 at Unknown time  . tiotropium (SPIRIVA) 18 MCG inhalation capsule Place 18 mcg into inhaler and inhale daily.   09/24/2015 at Unknown time  . warfarin (COUMADIN) 2 MG tablet Take 4 mg by mouth See admin instructions. TAKES 4MG  ONCE WEEKLY ON THURS ONLY   Past Week at Unknown time    Assessment: 81 yo F on Coumadin PTA for hx afib.  Presented 12/21 after fall with hematoma and INR >8.  INR was reversed with Vit K.  Initially patient was restarted on Coumadin 12/24 but changed to Xarelto 12/25.  Pt received Xarelto dose this PM, will not start Coumadin until 12/27.  Of note, Xarelto dose affect INRs and therefor initial INR values may not be reliable for Coumadin dosing.  Goal of  Therapy:  INR 2-3 Monitor platelets by anticoagulation protocol: Yes   Plan:  No Coumadin tonight. Daily INR.  Toys 'R' UsKimberly Yolani Vo, Pharm.D., BCPS Clinical Pharmacist Pager [phone] 09/29/2015 7:57 PM

## 2015-09-29 NOTE — Progress Notes (Signed)
[facility] PROGRESS NOTE  Madeline Serrano UEA:[id number] DOB: [birth year] DOA: 09/24/2015 PCP: Martha ClanShaw, William, MD  HPI 81 year old female with a history of paroxysmal atrial fibrillation on warfarin, hypertension, hyperlipidemia, diastolic CHF, TIA presented with sudden onset of shortness of breath and chest congestion on the day of admission. A day or 2 prior, patient had had a mechanical fall landing on her back. Seen by her PCP and x-rays (which are not in the system ) reportedly normal. However with progressively worsening shortness of breath and cough, patient sent to emergency room. CT angiogram of the chest reveals a left posterior chest wall contusion, but was negative for pulmonary embolus. She was found to be volume overloaded  Patient responded well to diuresis. Breathing comfortably, closer to baseline. Has diuresed over a liter. Following diuresis, INR now subtherapeutic and Coumadin initially restarted after being seen by cardiology. Patient herself is not feeling well. Continues to have significant amount of pain with movement on the left side of back mid way down.   Assessment/Plan: #1 acute on chronic diastolic heart failure Clinical improvement. Patient is -2.3 L during this hospitalization. Patient with good diuresis. Patient has been seen by cardiology recommending Lasix as needed when weighed over 2-3 pounds from baseline. Continue lower dose Coreg, Pravachol.  #2 large hematoma on back Secondary to fall while on chronic anticoagulation. H&H is been stable. Patient with significant pain. Will get plain films of the T and L-spine. Will consult with orthopedics for further evaluation and management. Anticoagulation was resumed yesterday. Continue Lidoderm patch. Start Ultram for breakthrough pain. Follow.  #3 hyperlipidemia Continue Pravachol.  #4 COPD Stable. Chest x-ray no acute abnormalities. Continue Spiriva,dulera, Mucinex.  #5 paroxysmal atrial  fibrillation Continue second night for rhythm control. Coumadin has been changed to xarelto per Dr. Rito EhrlichKrishnan for anticoagulation. Will change patient back to Coumadin.  #6 pulmonary nodule Per CT incidental finding. Outpatient follow-up.  #7 orthostatic hypotension Hydralazine has been discontinued. Coreg dose has been decreased. Follow.  #8 gait instability Patient is seen by PT and recommended skilled nursing facility.  #9 dysphagia Patient has been cleared by speech therapy. Barium esophagram did show some dysmotility and severe acid reflux. Continue PPI.  #10 history of knee DJD Patient missed appointment because she was hospitalized was posterior get a knee injection. Will notify orthopedics of patient's admission.  Code Status: DO NOT RESUSCITATE Family Communication: Updated patient and son at bedside. Disposition Plan: Skilled nursing facility when medically stable.   Consultants:  Cardiology: Dr. SwazilandJordan 09/26/2015  Orthopedics pending  Procedures:  2-D echo 09/25/2015  Chest x-ray 09/28/2015, 09/24/2015  Barium swallow 09/26/2015  Plain films of the right wrist 09/25/2015  CT angiogram chest 09/25/2015  Antibiotics:  None  HPI/Subjective: Patient complaining of back pain states back pain is not controlled. Patient denies any shortness of breath. No chest pain.  Objective: Filed Vitals:   09/29/15 0434 09/29/15 0822  BP: 134/49 126/54  Pulse: 60 58  Temp: 98 F (36.7 C)   Resp: 18     Intake/Output Summary (Last 24 hours) at 09/29/15 1157 Last data filed at 09/29/15 0912  Gross per 24 hour  Intake    680 ml  Output   1575 ml  Net   -895 ml   Filed Weights   09/27/15 0406 09/28/15 0612 09/29/15 0434  Weight: 67.903 kg (149 lb 11.2 oz) 67.314 kg (148 lb 6.4 oz) 65.59 kg (144 lb 9.6 oz)    Exam:   General:  NAD  Cardiovascular: RRR  Respiratory: CTA B  Abdomen: Soft, nontender, nondistended, positive bowel sounds.   Back: Large  hematoma noted       Musculoskeletal: No clubbing cyanosis or edema.  Data Reviewed: Basic Metabolic Panel:  Recent Labs Lab 09/25/15 0850 09/26/15 0428 09/27/15 0300 09/28/15 0404 09/29/15 0840  NA 126* 126* 126* 128* 129*  K 4.3 4.2 4.2 4.1 4.5  CL 92* 92* 92* 94* 95*  CO2 GLUCOSE 124* 127* 100* 108* 139*  BUN 11 20 25* 23* 20  CREATININE 1.03* 1.41* 1.27* 1.14* 1.15*  CALCIUM 9.4 9.0 8.8* 9.1 9.1   Liver Function Tests:  Recent Labs Lab 09/24/15 2029  AST 31  ALT 15  ALKPHOS 97  BILITOT 1.2  PROT 5.6*  ALBUMIN 3.1*   No results for input(s): LIPASE, AMYLASE in the last 168 hours. No results for input(s): AMMONIA in the last 168 hours. CBC:  Recent Labs Lab 09/24/15 2029 09/26/15 0428 09/29/15 0610  WBC 7.5 9.2 6.9  NEUTROABS 6.0  --   --   HGB 9.5* 9.7* 9.1*  HCT 28.5* 28.7* 28.1*  MCV 93.1 94.4 94.0  PLT 183 236 212   Cardiac Enzymes:  Recent Labs Lab 09/25/15 0039 09/25/15 0850 09/25/15 1330  TROPONINI <0.03 <0.03 <0.03   BNP (last 3 results)  Recent Labs  10/01/14 1512 09/24/15 2039  BNP 470.0* 1102.4*    ProBNP (last 3 results) No results for input(s): PROBNP in the last 8760 hours.  CBG: No results for input(s): GLUCAP in the last 168 hours.  Recent Results (from the past 240 hour(s))  Urine culture     Status: None   Collection Time: 09/24/15  8:25 PM  Result Value Ref Range Status   Specimen Description URINE, CLEAN CATCH  Final   Special Requests NONE  Final   Culture MULTIPLE SPECIES PRESENT, SUGGEST RECOLLECTION  Final   Report Status 09/26/2015 FINAL  Final     Studies: Dg Chest Port 1 View  09/28/2015  CLINICAL DATA:  Dyspnea, hypertension, gastroesophageal reflux, heart failure EXAM: PORTABLE CHEST 1 VIEW COMPARISON:  09/25/2015, 09/24/2015 FINDINGS: Low lung volumes evident with minor basilar atelectasis. Mild cardiomegaly without CHF. No focal pneumonia, collapse, or consolidation. No enlarging  effusion or pneumothorax. Trachea midline. Atherosclerosis of aorta. Degenerative changes of the spine with an associated scoliosis. Advanced left shoulder arthropathy evident. IMPRESSION: Low lung volumes and basilar atelectasis. Atherosclerosis and degenerative changes as above. Electronically Signed   By: Judie Petit.  Shick M.D.   On: 09/28/2015 18:11    Scheduled Meds: . carvedilol  6.25 mg Oral BID WC  . flecainide  100 mg Oral BID  . guaiFENesin  600 mg Oral BID  . lidocaine  1 patch Transdermal Q24H  . mometasone-formoterol  2 puff Inhalation BID  . multivitamin with minerals  1 tablet Oral Daily  . nitrofurantoin  100 mg Oral Daily  . pantoprazole  40 mg Oral BID  . pravastatin  20 mg Oral q1800  . rivaroxaban  15 mg Oral Q supper  . sodium chloride  3 mL Intravenous Q12H  . tiotropium  18 mcg Inhalation Daily   Continuous Infusions:   Active Problems:   Hyponatremia   Hypertension   COPD (chronic obstructive pulmonary disease) (HCC)   PAF NSR on Flecanide   Acute on chronic diastolic heart failure (HCC)   Aortic stenosis   Pulmonary nodule   Pre-syncope   CHF exacerbation (HCC)   Paroxysmal  atrial fibrillation (HCC)   Orthostatic hypotension   Chronic diastolic CHF (congestive heart failure) (HCC)   Traumatic hematoma of lower back    Time spent: 35 minutes  Ethlyn Alto M.D. [facility] Pager [phone]. If 7PM-7AM, please contact night-coverage at [email or web address], password [facility] 09/29/2015, 11:57 AM  LOS: 5 days

## 2015-09-29 NOTE — Progress Notes (Signed)
Physical medicine rehabilitation consult requested chart reviewed. Patient currently is with his son for the past 1 year. Used a cane and walker at times prior to admission. Presented 09/24/2015 with fall intractable back pain as well as multiple comorbidities. Patient has been progressing well with some waxing and waning. She was recently required minimal assist to ambulate 175 feet. Spoke at length with patient and son. Patient has been a [facility] in the past after hip surgery. Do not feel patient could tolerate inpatient rehabilitation therapy program at this time. Son discussed opportunity to return to [facility] longer length of stay a lesser intensity of therapies. Feel at this time would be the best options. Hold on formal rehabilitation consult at this time with recommendations for home therapies versus skilled nursing facility.

## 2015-09-30 LAB — CBC
HCT: 27.7 % — ABNORMAL LOW (ref 36.0–46.0)
HEMOGLOBIN: 9.3 g/dL — AB (ref 12.0–15.0)
MCH: 31.6 pg (ref 26.0–34.0)
MCHC: 33.6 g/dL (ref 30.0–36.0)
MCV: 94.2 fL (ref 78.0–100.0)
PLATELETS: 218 10*3/uL (ref 150–400)
RBC: 2.94 MIL/uL — ABNORMAL LOW (ref 3.87–5.11)
RDW: 14.5 % (ref 11.5–15.5)
WBC: 7.5 10*3/uL (ref 4.0–10.5)

## 2015-09-30 LAB — PROTIME-INR
INR: 1.78 — AB (ref 0.00–1.49)
PROTHROMBIN TIME: 20.6 s — AB (ref 11.6–15.2)

## 2015-09-30 MED ORDER — WARFARIN SODIUM 2.5 MG PO TABS: 2.5000 mg | ORAL_TABLET | Freq: Once | ORAL | Status: DC

## 2015-09-30 MED ORDER — CARVEDILOL 6.25 MG PO TABS: 6.2500 mg | ORAL_TABLET | Freq: Two times a day (BID) | ORAL | Status: AC

## 2015-09-30 MED ORDER — LIDOCAINE 5 % EX PTCH: 1.0000 | MEDICATED_PATCH | CUTANEOUS | Status: AC

## 2015-09-30 MED ORDER — PANTOPRAZOLE SODIUM 40 MG PO TBEC: 40.0000 mg | DELAYED_RELEASE_TABLET | Freq: Two times a day (BID) | ORAL | Status: AC

## 2015-09-30 MED ORDER — BUDESONIDE-FORMOTEROL FUMARATE 160-4.5 MCG/ACT IN AERO: 2.0000 | INHALATION_SPRAY | Freq: Two times a day (BID) | RESPIRATORY_TRACT | Status: DC

## 2015-09-30 MED ORDER — TRAMADOL HCL 50 MG PO TABS: 100.0000 mg | ORAL_TABLET | Freq: Four times a day (QID) | ORAL | Status: AC | PRN

## 2015-09-30 MED ORDER — WARFARIN SODIUM 2.5 MG PO TABS: 2.5000 mg | ORAL_TABLET | Freq: Once | ORAL | Status: AC

## 2015-09-30 NOTE — Discharge Summary (Addendum)
Physician Discharge Summary  SAE HANDRICH UJW:[id number] DOB: 1929-11-19 DOA: 09/24/2015  PCP: Martha Clan, MD  Admit date: 09/24/2015 Discharge date: 09/30/2015  Time spent: 65 minutes  Recommendations for Outpatient Follow-up:  1. Patient be discharged to skilled nursing facility at [name]. Patient will need PT/INR checked on Friday, 10/03/2015 with goal INR 2-3. 2. Patient is to follow-up with Dr. Allyson Sabal of cardiology in 1 week. 3. Patient may follow-up with Dr. Darrelyn Hillock, orthopedics on Thursday or Friday 12/29 or 10/03/2015 for knee injection.   Discharge Diagnoses:  Active Problems:   Hyponatremia   Hypertension   COPD (chronic obstructive pulmonary disease) (HCC)   PAF NSR on Flecanide   Acute on chronic diastolic heart failure (HCC)   Aortic stenosis   Pulmonary nodule   Pre-syncope   CHF exacerbation (HCC)   Paroxysmal atrial fibrillation (HCC)   Orthostatic hypotension   Chronic diastolic CHF (congestive heart failure) (HCC)   Traumatic hematoma of lower back   Acute on chronic congestive heart failure (HCC)   Esophageal dysphagia   Discharge Condition: Stable and improved.  Diet recommendation: Heart healthy.  Filed Weights   09/28/15 0612 09/29/15 0434 09/30/15 0525  Weight: 67.314 kg (148 lb 6.4 oz) 65.59 kg (144 lb 9.6 oz) 67.1 kg (147 lb 14.9 oz)    History of present illness:  Per Dr Wyline Mood is a 81 y.o. female   has a past medical history of PAF (paroxysmal atrial fibrillation) (HCC); Hypertension; High cholesterol; Macular degeneration; GERD (gastroesophageal reflux disease); TIA (transient ischemic attack); DJD (degenerative joint disease) (10/13); Chronic anticoagulation; Aortic valve stenosis, mild (11/13); Ejection fraction; and Diastolic heart failure (HCC).   Presented with  Reports of feeling short of breath at baseline due to COPD. She had not been feeling well. She was supposed to have a PCP appointment and fell as was  trying to get ready because she felt lightheaded. Denied head injury but fell on her back.. 1 day prior to admission, she apparently fell down secondary to feel lightheaded denied syncope. She presented to PCP who imaged her back no evidence of fracture.  On the day of admission, she developed worsening dyspnea and cough at 5 PM she was very distressed and called 911 did not take additional lasix, presented emergency department and was found to have pulmonary edema. Family stated she have not had good PO intake. Denied leg swelling, Of note last echogram in August 2015 showed moderate aortic stenosis and preserved EF. Cardiology was consulted that requested at this point medical admission. Patient denied associated chest pain haven't had any fevers or chills. Chest x-ray and emergent, showed pulmonary edema but also 7 mm nodule opacity in the upper lobe recommending follow-up.   Patient history of atrial fibrillation with RVR on chronic anticoagulation with Coumadin been treated with flecanide currently in sinus rhythm. Patient in the past has had TIAs with gross odor negative workup carotid Dopplers were unremarkable. She has had past admissions due to diastolic heart failure last time was in August 2016 regarding patient's blood pressure she has been on by mouth) clonidine secondary to labile blood pressure readings and has been referred to [facility] secondary to poor control Hospitalist was called for admission for CHF exacerbation  Hospital Course:  81 year old female with a history of paroxysmal atrial fibrillation on warfarin, hypertension, hyperlipidemia, diastolic CHF, TIA presented with sudden onset of shortness of breath and chest congestion on the day of admission. A day or 2 prior, patient had  had a mechanical fall landing on her back. Seen by her PCP and x-rays (which are not in the system ) reportedly normal. However with progressively worsening shortness of breath and cough, patient sent  to emergency room. CT angiogram of the chest reveals a left posterior chest wall contusion, but was negative for pulmonary embolus. She was found to be volume overloaded  Patient responded well to diuresis. Breathing comfortably, closer to baseline. Has diuresed over a liter. Following diuresis, INR now subtherapeutic and Coumadin initially restarted after being seen by cardiology. Patient herself is not feeling well. Continues to have significant amount of pain with movement on the left side of back mid way down.   Assessment/Plan: #1 acute on chronic diastolic heart failure Patient was admitted with worsening shortness of breath felt to be in acute on chronic diastolic heart failure. Patient was diuresed with IV Lasix followed by cardiology during the hospitalization. Patient was -2.72 L during the hospitalization.  Patient has been seen by cardiology recommending Lasix as needed when weighed over 2-3 pounds from baseline. Patient was maintained on Coreg and Pravachol and had improved clinically and back to baseline by day of discharge.   #2 large hematoma on back Secondary to fall while on chronic anticoagulation. H&H is been stable. Patient with significant pain. Plain films of the T and L-spine were done which did not show any acute abnormalities. Patient was seen in consultation by orthopedics who recommended conservative management with pain management and ice compresses. Patient was placed on a Lidoderm patch. Patient was also placed on Ultram for breakthrough pain. Outpatient follow-up.  #3 hyperlipidemia Continued on Pravachol.  #4 COPD Stable. Chest x-ray no acute abnormalities. Patient was maintained on Spiriva,dulera, Mucinex during the hospitalization..  #5 paroxysmal atrial fibrillation Patient was seen in consultation by cardiology and followed during the hospitalization. Patient remained in sinus rhythm on telemetry. It was recommended to continue patient's beta blocker for rate  control and flecainide for rhythm control. Patient's Coumadin was initially held and it was recommended per cardiology that patient be discharged on a lower dose of Coumadin at 2.5 mg daily secondary to her hematoma. INR will need to be checked on Friday, 10/03/2015. It was felt that if patient had more frequent falls in the future would likely need to discontinue Coumadin at that time however due to her prior history of TIAs in the past and high risk for embolic event if A. fib recurs recommended to continue patient on anticoagulation at this time. Patient will follow-up with cardiology as outpatient.   #6 pulmonary nodule Per CT incidental finding. Outpatient follow-up.  #7 orthostatic hypotension Hydralazine has been discontinued. Coreg dose has been decreased. Blood pressure remained stable. Outpatient follow-up. Follow.  #8 gait instability Patient is seen by PT and recommended skilled nursing facility.  #9 dysphagia Patient has been cleared by speech therapy. Barium esophagram did show some dysmotility and severe acid reflux. Patient was placed on a PPI twice daily.   #10 chronic hyponatremia Remained stable.  #11 history of knee DJD Patient missed appointment because she was hospitalized was posterior get a knee injection. Orthopedics was notified and patient may follow-up with them this Thursday or Friday, 10/02/2015 all 10/03/2015 in the office for her final injection.    Procedures:  2-D echo 09/25/2015  Chest x-ray 09/28/2015, 09/24/2015  Barium swallow 09/26/2015  Plain films of the right wrist 09/25/2015  CT angiogram chest 09/25/2015  Plane. It T and L-spine 09/29/2015  Consultations:  Orthopedics: Dr.  Brooks 09/29/2015  Cardiology: Dr. Peter Swaziland 09/26/2015  Discharge Exam: Filed Vitals:   09/30/15 0735 09/30/15 1147  BP: 161/61 106/77  Pulse: 59 52  Temp:  98.6 F (37 C)  Resp:  18    General: NAD Cardiovascular: RRR Respiratory: CTAB Back:  Large hematoma noted, softer.  Discharge Instructions   Discharge Instructions    AMB Referral to [facility] Care Management    Complete by:  As directed   Reason for consult:  Post hospital follow up  Diagnoses of:  Kidney Failure  Expected date of contact:  1-3 days (reserved for hospital discharges)  Please assign to community nurse for transition of care calls and assess for home visits. Questions please call:     Diet - low sodium heart healthy    Complete by:  As directed      Discharge instructions    Complete by:  As directed   Patient will need PT/INR checked on Friday 10/03/2015. Follow up with Dr Darrelyn Hillock Thursday or Friday for knee injection. Follow up with MD at SNF. Follow uip with Dr Allyson Sabal in 1 week. Place ice compressors to hematoma on back 4 times daily.     Increase activity slowly    Complete by:  As directed           Current Discharge Medication List    START taking these medications   Details  budesonide-formoterol (SYMBICORT) 160-4.5 MCG/ACT inhaler Inhale 2 puffs into the lungs 2 (two) times daily. Qty: 1 Inhaler, Refills: 0    lidocaine (LIDODERM) 5 % Place 1 patch onto the skin daily. Remove & Discard patch within 12 hours or as directed by MD Qty: 30 patch, Refills: 0    pantoprazole (PROTONIX) 40 MG tablet Take 1 tablet (40 mg total) by mouth 2 (two) times daily. Qty: 60 tablet, Refills: 0    traMADol (ULTRAM) 50 MG tablet Take 2 tablets (100 mg total) by mouth every 6 (six) hours as needed for moderate pain. Qty: 30 tablet, Refills: 0      CONTINUE these medications which have CHANGED   Details  carvedilol (COREG) 6.25 MG tablet Take 1 tablet (6.25 mg total) by mouth 2 (two) times daily with a meal. Qty: 60 tablet, Refills: 0    warfarin (COUMADIN) 2.5 MG tablet Take 1 tablet (2.5 mg total) by mouth one time only at 6 PM. Qty: 30 tablet, Refills: 0      CONTINUE these medications which have NOT CHANGED   Details  CALCIUM PO Take 1 tablet by  mouth 3 (three) times daily.     fish oil-omega-3 fatty acids 1000 MG capsule Take 1 g by mouth daily.    flecainide (TAMBOCOR) 100 MG tablet Take 1 tablet (100 mg total) by mouth 2 (two) times daily. Qty: 180 tablet, Refills: 2    furosemide (LASIX) 20 MG tablet Take 1 tablet (20 mg total) by mouth daily as needed. For weight gain of 3 pounds or more. Qty: 30 tablet, Refills: 1    Multiple Vitamin (MULTIVITAMIN WITH MINERALS) TABS tablet Take 1 tablet by mouth daily.    Multiple Vitamins-Minerals (ICAPS AREDS 2) CAPS Take 2 capsules by mouth 2 (two) times daily. WITH LUTEIN AND ZEAXANTHIN    nitrofurantoin (MACRODANTIN) 100 MG capsule Take 100 mg by mouth daily.     nitroGLYCERIN (NITROSTAT) 0.4 MG SL tablet Place 1 tablet (0.4 mg total) under the tongue every 5 (five) minutes as needed for chest pain. Qty: 25 tablet,  Refills: 12    Pitavastatin Calcium (LIVALO) 1 MG TABS Take 0.5 tablets (0.5 mg total) by mouth daily. Qty: 30 tablet, Refills: 11    tiotropium (SPIRIVA) 18 MCG inhalation capsule Place 18 mcg into inhaler and inhale daily.      STOP taking these medications     hydrALAZINE (APRESOLINE) 25 MG tablet      lansoprazole (PREVACID) 15 MG capsule        Allergies  Allergen Reactions  . Chlorthalidone Nausea Only    Lightheadedness, weakness, so "sick feeling"  . Sulfa Antibiotics Other (See Comments)    [name]  . Vioxx [Rofecoxib] Other (See Comments)    Renal problems  . Celebrex [Celecoxib] Swelling  . Clarithromycin Swelling  . Statins Other (See Comments)    Hair fell out  . Other Other (See Comments)    EXTENDED RELEASE tablets  . Penicillins Rash   Follow-up Information    Follow up with [facility].   Why:  They will do your home health care at your home   Contact information:   [address] [place] [address] [phone]       Please follow up.   Why:  f/u wityh MD at SNF      Follow up On 10/03/2015.    Why:  PT/INR check.      Follow up with Nanetta Batty, MD. Schedule an appointment as soon as possible for a visit in 1 week.   Specialties:  Cardiology, Radiology   Contact information:   [address] [place] [address] [phone]       Follow up with Ranee Gosselin A, MD. Schedule an appointment as soon as possible for a visit on 10/03/2015.   Specialty:  Orthopedic Surgery   Why:  f/u for knee injection   Contact information:   [address] [place] [address] [phone]        The results of significant diagnostics from this hospitalization (including imaging, microbiology, ancillary and laboratory) are listed below for reference.    Significant Diagnostic Studies: Dg Chest 2 View  09/24/2015  CLINICAL DATA:  Cough and shortness of breath for 1 day EXAM: CHEST  2 VIEW COMPARISON:  12/17/2014 FINDINGS: There is a 7 mm nodular opacity in the right upper lobe. There is bilateral interstitial thickening. There are bilateral small pleural effusions. There is prominence of the central pulmonary vasculature. There is no pneumothorax. The heart and mediastinal contours are unremarkable. There is severe osteoarthritis of the left glenohumeral joint. IMPRESSION: 1. Bilateral interstitial thickening and bilateral pleural effusions concerning for mild pulmonary edema. 2. 7 mm nodular opacity in the right upper lobe. Recommend further evaluation with a nonemergent CT of the chest. Electronically Signed   By: Elige Ko   On: 09/24/2015 20:58   Dg Thoracic Spine 2 View  09/29/2015  CLINICAL DATA:  Fall.  Hematoma left back.  Pain. EXAM: THORACIC SPINE 2 VIEWS COMPARISON:  Chest CT 09/25/2015 FINDINGS: Mild superior endplate deformity of T5 is unchanged from the CT and is probably chronic. No new fracture. Dextroscoliosis. Mild thoracic disc degeneration and spurring in the lower thoracic spine. IMPRESSION: Mild fracture of T5 is unchanged and  probably chronic. No acute abnormality. Electronically Signed   By: Marlan Palau M.D.   On: 09/29/2015 15:45   Dg Lumbar Spine Complete  09/29/2015  CLINICAL DATA:  Fall.  Back pain EXAM: LUMBAR SPINE - COMPLETE 4+ VIEW COMPARISON:  CT abdomen pelvis 08/31/2006 FINDINGS: Negative for fracture.  Negative for pars defect.  No mass lesion. Mild anterior slip L5-S1 related to disc and facet degeneration. Multilevel disc degeneration throughout the lumbar spine. Mild levoscoliosis. Prominent right-sided osteophytes at L2-3 and L3-4. IMPRESSION: Scoliosis and degenerative change.  Negative for fracture. Electronically Signed   By: Marlan Palau M.D.   On: 09/29/2015 15:47   Dg Wrist 2 Views Right  09/25/2015  CLINICAL DATA:  Wrist pain after fall.  Initial encounter. EXAM: RIGHT WRIST - 2 VIEW COMPARISON:  12/17/2014 FINDINGS: No evidence of acute fracture or dislocation. Advanced first CMC osteoarthritis with subluxation, fragmentation, and metacarpal base cystic change. Osteopenia and chondrocalcinosis. IMPRESSION: No acute finding. Electronically Signed   By: Marnee Spring M.D.   On: 09/25/2015 01:03   Ct Angio Chest Pe W/cm &/or Wo Cm  09/25/2015  CLINICAL DATA:  Dyspnea.  Fall today with back pain EXAM: CT ANGIOGRAPHY CHEST WITH CONTRAST TECHNIQUE: Multidetector CT imaging of the chest was performed using the standard protocol during bolus administration of intravenous contrast. Multiplanar CT image reconstructions and MIPs were obtained to evaluate the vascular anatomy. CONTRAST:  OMNIPAQUE IOHEXOL 350 MG/ML SOLN COMPARISON:  None. FINDINGS: THORACIC INLET/BODY WALL: Subcutaneous reticulation along the left posterior chest wall compatible with contusion. 1 cm right thyroid nodule which is considered incidental. MEDIASTINUM: Normal heart size. No pericardial effusion. No evidence of acute vascular abnormality, including pulmonary embolism or aortic dissection. Extensive atherosclerosis. No  adenopathy. LUNG WINDOWS: Mosaic attenuation lungs which is likely air trapping. There is extensive airways debris with layering in the bilateral mainstem bronchus. Dependent atelectasis with small layering effusions. Spiculated 8 mm pulmonary nodule in the left upper lobe. UPPER ABDOMEN: No acute findings. OSSEOUS: Severe glenohumeral osteoarthritis on the left with joint effusion and secondary osteochondromatosis. Review of the MIP images confirms the above findings. IMPRESSION: 1. No evidence of pulmonary embolism. 2. Partly visualized left posterior chest wall contusion. 3. Extensive airway debris, question aspiration. 4. Small bilateral pleural effusion and mild atelectasis. 5. 8 mm spiculated nodule in the left upper lobe. Follow-up chest CT at 3-66months is recommended. Electronically Signed   By: Marnee Spring M.D.   On: 09/25/2015 02:06   Dg Esophagus  09/26/2015  CLINICAL DATA:  Dysphagia, acid reflux, occasional vomiting with soft foods. EXAM: ESOPHOGRAM/BARIUM SWALLOW TECHNIQUE: Single contrast examination was performed using thin barium or water soluble. FLUOROSCOPY TIME:  Fluoroscopy Time:  1 minute and 20 seconds. COMPARISON:  None. FINDINGS: The esophagus is normal in caliber without evidence of obstructing masses or filling defects. There is however poor motility with prominent tertiary contractions and spontaneous severe gastroesophageal reflux. There is a small hiatal hernia. A 13 mm barium tablet was administered and passed without delay into the stomach. IMPRESSION: Poor motility of the esophagus with severe spontaneous gastroesophageal reflux. Small hiatal hernia. Electronically Signed   By: Ted Mcalpine M.D.   On: 09/26/2015 19:32   Dg Chest Port 1 View  09/28/2015  CLINICAL DATA:  Dyspnea, hypertension, gastroesophageal reflux, heart failure EXAM: PORTABLE CHEST 1 VIEW COMPARISON:  09/25/2015, 09/24/2015 FINDINGS: Low lung volumes evident with minor basilar atelectasis. Mild  cardiomegaly without CHF. No focal pneumonia, collapse, or consolidation. No enlarging effusion or pneumothorax. Trachea midline. Atherosclerosis of aorta. Degenerative changes of the spine with an associated scoliosis. Advanced left shoulder arthropathy evident. IMPRESSION: Low lung volumes and basilar atelectasis. Atherosclerosis and degenerative changes as above. Electronically Signed   By: Judie Petit.  Shick M.D.  On: 09/28/2015 18:11    Microbiology: Recent Results (from the past 240 hour(s))  Urine culture     Status: None   Collection Time: 09/24/15  8:25 PM  Result Value Ref Range Status   Specimen Description URINE, CLEAN CATCH  Final   Special Requests NONE  Final   Culture MULTIPLE SPECIES PRESENT, SUGGEST RECOLLECTION  Final   Report Status 09/26/2015 FINAL  Final     Labs: Basic Metabolic Panel:  Recent Labs Lab 09/25/15 0850 09/26/15 0428 09/27/15 0300 09/28/15 0404 09/29/15 0840  NA 126* 126* 126* 128* 129*  K 4.3 4.2 4.2 4.1 4.5  CL 92* 92* 92* 94* 95*  CO2 25 22 28 27 27   GLUCOSE 124* 127* 100* 108* 139*  BUN 11 20 25* 23* 20  CREATININE 1.03* 1.41* 1.27* 1.14* 1.15*  CALCIUM 9.4 9.0 8.8* 9.1 9.1   Liver Function Tests:  Recent Labs Lab 09/24/15 2029  AST 31  ALT 15  ALKPHOS 97  BILITOT 1.2  PROT 5.6*  ALBUMIN 3.1*   No results for input(s): LIPASE, AMYLASE in the last 168 hours. No results for input(s): AMMONIA in the last 168 hours. CBC:  Recent Labs Lab 09/24/15 2029 09/26/15 0428 09/29/15 0610 09/30/15 0728  WBC 7.5 9.2 6.9 7.5  NEUTROABS 6.0  --   --   --   HGB 9.5* 9.7* 9.1* 9.3*  HCT 28.5* 28.7* 28.1* 27.7*  MCV 93.1 94.4 94.0 94.2  PLT 183 236 212 218   Cardiac Enzymes:  Recent Labs Lab 09/25/15 0039 09/25/15 0850 09/25/15 1330  TROPONINI <0.03 <0.03 <0.03   BNP: BNP (last 3 results)  Recent Labs  10/01/14 1512 09/24/15 2039  BNP 470.0* 1102.4*    ProBNP (last 3 results) No results for input(s): PROBNP in the last 8760  hours.  CBG: No results for input(s): GLUCAP in the last 168 hours.     SignedRamiro Harvest MD  FACP  [facility] 09/30/2015, 12:16 PM

## 2015-09-30 NOTE — Progress Notes (Signed)
Patient will discharge to [facility] Anticipated discharge date:12/27 Family notified: pt son Transportation by [employer]- scheduled for 1:30pm  CSW signing off.  Merlyn LotJenna Holoman, LCSWA Clinical Social Worker [phone]

## 2015-09-30 NOTE — Progress Notes (Signed)
Called report to [name]. Pt is waiting on ambulance.

## 2015-09-30 NOTE — Care Management Important Message (Signed)
Important Message  Patient Details  Name: Madeline Serrano MRN: [id number] Date of Birth: June 21, 1930   Medicare Important Message Given:  Yes    Kyla BalzarineShealy, Maylen Waltermire Abena 09/30/2015, 12:17 PM

## 2015-09-30 NOTE — Progress Notes (Addendum)
ANTICOAGULATION CONSULT NOTE - Initial Consult  Pharmacy Consult for Coumadin Indication: atrial fibrillation  Allergies  Allergen Reactions  . Chlorthalidone Nausea Only    Lightheadedness, weakness, so "sick feeling"  . Sulfa Antibiotics Other (See Comments)    [name]  . Vioxx [Rofecoxib] Other (See Comments)    Renal problems  . Celebrex [Celecoxib] Swelling  . Clarithromycin Swelling  . Statins Other (See Comments)    Hair fell out  . Other Other (See Comments)    EXTENDED RELEASE tablets  . Penicillins Rash    Patient Measurements: Height:  (165.1 cm) Weight: 147 lb 14.9 oz (67.1 kg) (scale a) IBW/kg (Calculated) : 57  Vital Signs: Temp: 97.7 F (36.5 C) (12/27 0525) Temp Source: Oral (12/27 0525) BP: 161/61 mmHg (12/27 0735) Pulse Rate: 59 (12/27 0735)  Labs:  Recent Labs  09/28/15 0404 09/29/15 0610 09/29/15 0840 09/30/15 0728  HGB  --  9.1*  --  9.3*  HCT  --  28.1*  --  27.7*  PLT  --  212  --  218  LABPROT 15.8*  --   --  20.6*  INR 1.24  --   --  1.78*  CREATININE 1.14*  --  1.15*  --     Estimated Creatinine Clearance: 33.4 mL/min (by C-G formula based on Cr of 1.15).   Medical History: Past Medical History  Diagnosis Date  . PAF (paroxysmal atrial fibrillation) (HCC)     NSR on Flecanide  . Hypertension     labile  . High cholesterol   . Macular degeneration   . GERD (gastroesophageal reflux disease)   . TIA (transient ischemic attack)     several in past  . DJD (degenerative joint disease) 10/13    s/p rt THR  . Chronic anticoagulation   . Aortic valve stenosis, mild 11/13  . Ejection fraction   . Diastolic heart failure (HCC)     Medications:  Prescriptions prior to admission  Medication Sig Dispense Refill Last Dose  . CALCIUM PO Take 1 tablet by mouth 3 (three) times daily.    09/24/2015 at Unknown time  . carvedilol (COREG) 12.5 MG tablet Take 25 mg by mouth 2 (two) times daily with a meal.    09/24/2015 at  1800  . fish oil-omega-3 fatty acids 1000 MG capsule Take 1 g by mouth daily.   09/24/2015 at Unknown time  . flecainide (TAMBOCOR) 100 MG tablet Take 1 tablet (100 mg total) by mouth 2 (two) times daily. 180 tablet 2 09/24/2015 at pm  . furosemide (LASIX) 20 MG tablet Take 1 tablet (20 mg total) by mouth daily as needed. For weight gain of 3 pounds or more. 30 tablet 1 unknown  . hydrALAZINE (APRESOLINE) 25 MG tablet Take 50 mg by mouth 2 (two) times daily. If Bp elevated takes additional dose  0 09/24/2015 at Unknown time  . lansoprazole (PREVACID) 15 MG capsule Take 15 mg by mouth daily as needed (reflux).    Past Month at Unknown time  . Multiple Vitamin (MULTIVITAMIN WITH MINERALS) TABS tablet Take 1 tablet by mouth daily.   09/24/2015 at Unknown time  . Multiple Vitamins-Minerals (ICAPS AREDS 2) CAPS Take 2 capsules by mouth 2 (two) times daily. WITH LUTEIN AND ZEAXANTHIN   09/24/2015 at Unknown time  . nitrofurantoin (MACRODANTIN) 100 MG capsule Take 100 mg by mouth daily.    09/24/2015 at Unknown time  . nitroGLYCERIN (NITROSTAT) 0.4 MG SL tablet Place 1 tablet (0.4  mg total) under the tongue every 5 (five) minutes as needed for chest pain. 25 tablet 12 not used  . Pitavastatin Calcium (LIVALO) 1 MG TABS Take 0.5 tablets (0.5 mg total) by mouth daily. 30 tablet 11 09/24/2015 at Unknown time  . tiotropium (SPIRIVA) 18 MCG inhalation capsule Place 18 mcg into inhaler and inhale daily.   09/24/2015 at Unknown time  . warfarin (COUMADIN) 2 MG tablet Take 4 mg by mouth See admin instructions. TAKES 4MG  ONCE WEEKLY ON THURS ONLY   Past Week at Unknown time    Assessment: 81 yo F on Coumadin PTA for hx afib.  Presented 12/21 after fall with hematoma and INR >8.  INR was reversed with Vit K.  Initially patient was restarted on Coumadin 12/24 but changed to Xarelto 12/25.  Pt received Xarelto dose 12/26 PM, will not start Coumadin until 12/27.  Of note, Xarelto dose affect INRs and therefor initial  INR values may not be reliable for Coumadin dosing.  INR subtherapeutic at 1.78.  Will give Warfarin 2.5 mg x 1 tonight hgb 9.3, plts 218   Goal of Therapy:  INR 2-3 Monitor platelets by anticoagulation protocol: Yes   Plan:  Warfarin 2.5 mg x 1 Daily INR/CBC q3d Monitor for s/sx of bleeding  Hazle NordmannKelsy Combs, PharmD Pharmacy Resident [phone]  09/30/2015 10:51 AM

## 2015-09-30 NOTE — Progress Notes (Signed)
Per MD pt is stable for DC today- pt and son first choice is [facility]- they are able to accept pt today.  Pt son to go to facility to complete paperwork- pt plans to be transported by [facility]  CSW will continue to follow  Merlyn LotJenna Holoman, [facility] CenterCSWA Clinical Social Worker [phone]

## 2015-09-30 NOTE — Progress Notes (Signed)
    Subjective:     Patient reports pain as 2 on 0-10 scale.   Denies CP or SOB.  Voiding without difficulty. Positive flatus. Objective: Vital signs in last 24 hours: Temp:  [97.7 F (36.5 C)-98.1 F (36.7 C)] 97.7 F (36.5 C) (12/27 0525) Pulse Rate:  [56-62] 59 (12/27 0735) Resp:  [18] 18 (12/27 0525) BP: (126-180)/(48-74) 161/61 mmHg (12/27 0735) SpO2:  [97 %-98 %] 97 % (12/27 0525) Weight:  [67.1 kg (147 lb 14.9 oz)] 67.1 kg (147 lb 14.9 oz) (12/27 0525)  Intake/Output from previous day: 12/26 0701 - 12/27 0700 In: 540 [P.O.:540] Out: 950 [Urine:950] Intake/Output this shift:    Labs:  Recent Labs  09/29/15 0610  HGB 9.1*    Recent Labs  09/29/15 0610  WBC 6.9  RBC 2.99*  HCT 28.1*  PLT 212    Recent Labs  09/28/15 0404 09/29/15 0840  NA 128* 129*  K 4.1 4.5  CL 94* 95*  CO2 27 27  BUN 23* 20  CREATININE 1.14* 1.15*  GLUCOSE 108* 139*  CALCIUM 9.1 9.1    Recent Labs  09/28/15 0404  INR 1.24    Physical Exam: Neurologically intact Intact pulses distally Compartment soft  Assessment/Plan:     Stable from ortho standpoint No need for acute intervention Will sign-off  Please call if further questions or concerns arise  Emsley Custer D for Dr. Venita Lickahari Yazid Pop [facility] [phone] 09/30/2015, 8:10 AM

## 2015-10-01 ENCOUNTER — Other Ambulatory Visit: Payer: Self-pay

## 2015-10-01 ENCOUNTER — Other Ambulatory Visit: Payer: Self-pay | Admitting: *Deleted

## 2015-10-01 DIAGNOSIS — I5032 Chronic diastolic (congestive) heart failure: Secondary | ICD-10-CM

## 2015-10-01 NOTE — Patient Outreach (Signed)
Covering for Venita LickJ. Wallace.  Referral received from hospital liaison, member recently admitted for congestive heart failure, discharged yesterday 09/30/15.  According to notes, she was discharged to SNF ([name]).  This care manager will inform assigned care manager of member's disposition and send referral to social worker for involvement while in facility.  Kemper DurieMonica Satsuki Zillmer, BSN, [facility] Care Management  [facility] Care Manager [phone]

## 2015-10-07 ENCOUNTER — Other Ambulatory Visit: Payer: Self-pay | Admitting: Licensed Clinical Social Worker

## 2015-10-07 ENCOUNTER — Ambulatory Visit (INDEPENDENT_AMBULATORY_CARE_PROVIDER_SITE_OTHER): Payer: Medicare Other | Admitting: Cardiovascular Disease

## 2015-10-07 ENCOUNTER — Encounter: Payer: Self-pay | Admitting: Cardiovascular Disease

## 2015-10-07 VITALS — BP 170/64 | HR 60 | Ht 65.0 in | Wt 148.0 lb

## 2015-10-07 DIAGNOSIS — I5032 Chronic diastolic (congestive) heart failure: Secondary | ICD-10-CM | POA: Diagnosis not present

## 2015-10-07 DIAGNOSIS — E7800 Pure hypercholesterolemia, unspecified: Secondary | ICD-10-CM

## 2015-10-07 DIAGNOSIS — I10 Essential (primary) hypertension: Secondary | ICD-10-CM

## 2015-10-07 DIAGNOSIS — I480 Paroxysmal atrial fibrillation: Secondary | ICD-10-CM

## 2015-10-07 NOTE — Assessment & Plan Note (Signed)
History of hyperlipidemia on statin therapy followed by her PCP. 

## 2015-10-07 NOTE — Assessment & Plan Note (Signed)
History of hypertension blood pressure measured at 170/64. She was on hydralazine before and now is only on carvedilol. Her blood pressures in the skilled nursing facility or in the 140 range systolic.

## 2015-10-07 NOTE — Patient Instructions (Signed)
Medication Instructions:  Your physician recommends that you continue on your current medications as directed. Please refer to the Current Medication list given to you today.   Labwork: none  Testing/Procedures: none  Follow-Up: We request that you follow-up in: 6-8 weeks with an extender and in 4 months with Dr San MorelleBerry  You will receive a reminder letter in the mail two months in advance. If you don't receive a letter, please call our office to schedule the follow-up appointment.   Any Other Special Instructions Will Be Listed Below (If Applicable).     If you need a refill on your cardiac medications before your next appointment, please call your pharmacy.

## 2015-10-07 NOTE — Progress Notes (Signed)
10/07/2015 Madeline Serrano   [birth year]  [id number]  Primary Physician Madeline Clan, MD Primary Cardiologist: Madeline Gess MD Madeline Serrano   HPI:  The patient is a very pleasant 82 year old mildly overweight, widowed Caucasian female, mother of 2 sons, grandmother of 2 grandchildren, who I last saw in the office 09/10/15. She is originally from [place] and moved to [place] 16years ago. She has a history of atrial fibrillation with RVR in the past, on Coumadin anticoagulation and flecainide, maintaining sinus rhythm. Her other problems include hypertension, hyperlipidemia, and positive family history for heart disease with a father who died of an MI at age 50. She also has GERD. By echocardiogram, she has normal LV size and function with mild left atrial enlargement and normal Myoview. She has had no episodes of breakthrough PAF since her flecainide dose was adjusted.. She did break her hip back in January of 2013 and had hip replacement by Dr. Lajoyce Corners with prolonged hospitalization and rehab at [facility]. She also had several TIAs in the past , and had carotid Dopplers that were entirely normal.. She has been adjusting her blood pressure medicines because of symptomatic hypotension in the morning after taking her Coreg and hypertension later in the afternoon. She was admitted to the hospital with diastolic heart failure in August and was diuresed. She does have labile hypertension and was given when necessary clonidine by Dr. Eric Form. Since I saw her a month ago she's been hospitalized with diastolic heart failure requiring IV diuresis, over an evaluation with INR in the range and mechanical fall with a hematoma.  Current Outpatient Prescriptions  Medication Sig Dispense Refill  . CALCIUM PO Take 1 tablet by mouth 3 (three) times daily.     . carvedilol (COREG) 6.25 MG tablet Take 1 tablet (6.25 mg total) by mouth 2 (two) times daily with a meal. 60 tablet 0  . fish  oil-omega-3 fatty acids 1000 MG capsule Take 1 g by mouth daily.    . flecainide (TAMBOCOR) 100 MG tablet Take 1 tablet (100 mg total) by mouth 2 (two) times daily. 180 tablet 2  . furosemide (LASIX) 20 MG tablet Take 1 tablet (20 mg total) by mouth daily as needed. For weight gain of 3 pounds or more. 30 tablet 1  . lidocaine (LIDODERM) 5 % Place 1 patch onto the skin daily. Remove & Discard patch within 12 hours or as directed by MD 30 patch 0  . Multiple Vitamin (MULTIVITAMIN WITH MINERALS) TABS tablet Take 1 tablet by mouth daily.    . Multiple Vitamins-Minerals (ICAPS AREDS 2) CAPS Take 2 capsules by mouth 2 (two) times daily. WITH LUTEIN AND ZEAXANTHIN    . nitrofurantoin (MACRODANTIN) 100 MG capsule Take 100 mg by mouth daily.     . nitroGLYCERIN (NITROSTAT) 0.4 MG SL tablet Place 1 tablet (0.4 mg total) under the tongue every 5 (five) minutes as needed for chest pain. 25 tablet 12  . pantoprazole (PROTONIX) 40 MG tablet Take 1 tablet (40 mg total) by mouth 2 (two) times daily. 60 tablet 0  . Pitavastatin Calcium (LIVALO) 1 MG TABS Take 0.5 tablets (0.5 mg total) by mouth daily. 30 tablet 11  . tiotropium (SPIRIVA) 18 MCG inhalation capsule Place 18 mcg into inhaler and inhale daily.    . traMADol (ULTRAM) 50 MG tablet Take 2 tablets (100 mg total) by mouth every 6 (six) hours as needed for moderate pain. 30 tablet 0  . warfarin (  COUMADIN) 2.5 MG tablet Take 1 tablet (2.5 mg total) by mouth one time only at 6 PM. 30 tablet 0   No current facility-administered medications for this visit.    Allergies  Allergen Reactions  . Chlorthalidone Nausea Only    Lightheadedness, weakness, so "sick feeling"  . Sulfa Antibiotics Other (See Comments)    Kathlen BrunswickStevens Johnsons  . Vioxx [Rofecoxib] Other (See Comments)    Renal problems  . Celebrex [Celecoxib] Swelling  . Clarithromycin Swelling  . Statins Other (See Comments)    Hair fell out  . Other Other (See Comments)    EXTENDED RELEASE tablets    . Penicillins Rash    Social History   Social History  . Marital Status: Widowed    Spouse Name: N/A  . Number of Children: N/A  . Years of Education: N/A   Occupational History  . Not on file.   Social History Main Topics  . Smoking status: Never Smoker   . Smokeless tobacco: Never Used  . Alcohol Use: No  . Drug Use: No  . Sexual Activity: Not on file   Other Topics Concern  . Not on file   Social History Narrative     Review of Systems: General: negative for chills, fever, night sweats or weight changes.  Cardiovascular: negative for chest pain, dyspnea on exertion, edema, orthopnea, palpitations, paroxysmal nocturnal dyspnea or shortness of breath Dermatological: negative for rash Respiratory: negative for cough or wheezing Urologic: negative for hematuria Abdominal: negative for nausea, vomiting, diarrhea, bright red blood per rectum, melena, or hematemesis Neurologic: negative for visual changes, syncope, or dizziness All other systems reviewed and are otherwise negative except as noted above.    Blood pressure 170/64, pulse 60, height 5\' 5"  (1.651 m), weight 148 lb (67.132 kg).  General appearance: alert and no distress Neck: no adenopathy, no JVD, supple, symmetrical, trachea midline, thyroid not enlarged, symmetric, no tenderness/mass/nodules and right carotid bruit Lungs: clear to auscultation bilaterally Heart: regular rate and rhythm, S1, S2 normal, no murmur, click, rub or gallop Abdomen: soft, non-tender; bowel sounds normal; no masses,  no organomegaly  EKG not performed today  ASSESSMENT AND PLAN:   PAF NSR on Flecanide Maintaining normal sinus rhythm on flecainide and Coumadin. She was recently hospitalized after a mechanical fall with an INR of 8 and 8 hematoma on her back. Apparently her in isolation was reversed. With the fluctuations in her INR on concerned about treating her with Coumadin and will explore a novel oral  anticoagulant.  Hypertension History of hypertension blood pressure measured at 170/64. She was on hydralazine before and now is only on carvedilol. Her blood pressures in the skilled nursing facility or in the 140 range systolic.  High cholesterol History of hyperlipidemia on statin therapy followed by her PCP  Chronic diastolic CHF (congestive heart failure) (HCC) History of diastolic heart failure on a diuretic recently hospitalized 09/24/15 for 6 days and was diuresed. Her echo performed 09/25/15 revealed preserved ejection fraction with basal inferolateral akinesia and high filling pressures with mild AI and moderate MR. Apparently she had not taken her Lasix for several days prior to admission.      Madeline GessJonathan J. Berry MD FACP,FACC,FAHA, [facility]SCAI 10/07/2015 12:10 PM

## 2015-10-07 NOTE — Patient Outreach (Signed)
[facility]) Care Management  10/07/2015  Madeline Serrano May 19, 1930 [id number]   Assessment-CSW completed outreach to patient's son on 10/07/15 after receiving referral. [facility] CSW contacted SNF social worker and left a HIPPA compliant voice message. Patient's son answered and provided HIPPA verifications. CSW introduced self, reason for call and of [facility] services. Patient remembers speaking to [facility] hospital liaison. Madeline Serrano is unsure as to whether patient will be staying long term or short term. CSW informed patient's son that she will be in touch in order to provide social work assistance for patient and family. Patient's son accepting of social work assistance and SNF visit by this CSW in order to complete assessment.   Plan-CSW will make an additional outreach to SNF social worker by 10/10/15.  Madeline Serrano, BSW, MSW, LCSW [facility] Care Management [email or web address] Phone: [phone] Fax: [phone]

## 2015-10-07 NOTE — Assessment & Plan Note (Signed)
History of diastolic heart failure on a diuretic recently hospitalized 09/24/15 for 6 days and was diuresed. Her echo performed 09/25/15 revealed preserved ejection fraction with basal inferolateral akinesia and high filling pressures with mild AI and moderate MR. Apparently she had not taken her Lasix for several days prior to admission.

## 2015-10-07 NOTE — Assessment & Plan Note (Signed)
Maintaining normal sinus rhythm on flecainide and Coumadin. She was recently hospitalized after a mechanical fall with an INR of 8 and 8 hematoma on her back. Apparently her in isolation was reversed. With the fluctuations in her INR on concerned about treating her with Coumadin and will explore a novel oral anticoagulant.

## 2015-10-10 ENCOUNTER — Other Ambulatory Visit: Payer: Self-pay | Admitting: Licensed Clinical Social Worker

## 2015-10-10 NOTE — Patient Outreach (Signed)
[facility]) Care Management  [facility]  10/10/2015  Madeline Serrano 03-Jan-1930 [id number]    Current Medications:  Current Outpatient Prescriptions  Medication Sig Dispense Refill  . CALCIUM PO Take 1 tablet by mouth 3 (three) times daily.     . carvedilol (COREG) 6.25 MG tablet Take 1 tablet (6.25 mg total) by mouth 2 (two) times daily with a meal. 60 tablet 0  . fish oil-omega-3 fatty acids 1000 MG capsule Take 1 g by mouth daily.    . flecainide (TAMBOCOR) 100 MG tablet Take 1 tablet (100 mg total) by mouth 2 (two) times daily. 180 tablet 2  . furosemide (LASIX) 20 MG tablet Take 1 tablet (20 mg total) by mouth daily as needed. For weight gain of 3 pounds or more. 30 tablet 1  . lidocaine (LIDODERM) 5 % Place 1 patch onto the skin daily. Remove & Discard patch within 12 hours or as directed by MD 30 patch 0  . Multiple Vitamin (MULTIVITAMIN WITH MINERALS) TABS tablet Take 1 tablet by mouth daily.    . Multiple Vitamins-Minerals (ICAPS AREDS 2) CAPS Take 2 capsules by mouth 2 (two) times daily. WITH LUTEIN AND ZEAXANTHIN    . nitrofurantoin (MACRODANTIN) 100 MG capsule Take 100 mg by mouth daily.     . nitroGLYCERIN (NITROSTAT) 0.4 MG SL tablet Place 1 tablet (0.4 mg total) under the tongue every 5 (five) minutes as needed for chest pain. 25 tablet 12  . pantoprazole (PROTONIX) 40 MG tablet Take 1 tablet (40 mg total) by mouth 2 (two) times daily. 60 tablet 0  . Pitavastatin Calcium (LIVALO) 1 MG TABS Take 0.5 tablets (0.5 mg total) by mouth daily. 30 tablet 11  . tiotropium (SPIRIVA) 18 MCG inhalation capsule Place 18 mcg into inhaler and inhale daily.    . traMADol (ULTRAM) 50 MG tablet Take 2 tablets (100 mg total) by mouth every 6 (six) hours as needed for moderate pain. 30 tablet 0  . warfarin (COUMADIN) 2.5 MG tablet Take 1 tablet (2.5 mg total) by mouth one time only at 6 PM. 30 tablet 0   No current facility-administered medications for this visit.     Functional Status:  In your present state of health, do you have any difficulty performing the following activities: 09/29/2015  Hearing? Y  Vision? Y  Difficulty concentrating or making decisions? N  Walking or climbing stairs? Y  Dressing or bathing? Y  Doing errands, shopping? N    Fall/Depression Screening:  PHQ 2/9 Scores 10/10/2015  PHQ - 2 Score 0    Assessment: CSW completed SNF visit at [name] with patient on 10/10/15. Patient was in the room watching television when CSW arrived. Patient states that her son Loraine LericheMark had reminded her that this CSW would be completing visit today. Patient is very pleasant and has a great sense of humor. At this time patient started to use her hearing device which helps her to hear. Patient uses an application on her phone which assist her by processing sounds through her headphones to make message clearer and amplify sounds.  Patient has two son's Onalee Huaavid and Loraine LericheMark who are very supportive. Loraine LericheMark is currently staying at her residence with her and has been for over the past two months for personal reasons and patient wishes for this to continue as she enjoys the company. Patient's other son [place]alee [place]David [place] in [place] and came down last week to visit her. Patient has grand children who have came  to visit her. Patient reports "I usually take really good care of myself. I do what my doctors tell me and I take my medications like I am suppose to." Patient reports that she had a hospitalization three years ago due to a fall and was also discharged to [name]. Patient denies any issues with her finances but shares "Just like anyone I am on a fixed income but I am doing well for myself." Patient states "My main problems are my walking, my eye sight, my hearing, my congestive heart failure and my blood pressure." Patient has an aide that comes to her residence two days per week for four hours and the aide assist her by taking her to medical appointments, completes  light cleaning duties, grocery shopping and assist her with daily activities. CSW questioned if aide is not able to transport her to appointments then who would be able to and patient reports "My son will or if he is not able to then my daughter in law." Patient reports "They say I'm doing really good in therapy. After that horrible fall at my house which landed me here I have dealt with extreme pain but everyday it starts to get better." Patient reports that she does not experience any signs of depression or sadness stating "I'm a pretty happy person and I have always been this way." Patient reports that she uses a glasses walker, cane, bedside commode (if needed), shoe horn, built in shower bars and seat. Patient reports having a difficult time coping with her poor vision and hearing but states that the new hearing device has helped. Patient reports that she needs assistance with reading. Patient reports that her sons are her POA both for financially and for health care. Patient states that she has a DNR order in place. Patient denies any substance or alcohol use and has never smoked cigarettes. Patient reports having a poor appetite and shares "eating her does not make that any better." Patient does not use nutrition supplements as they make her "sick to my stomach." Patient reports that she is unaware of if a discharge date has been set. CSW questioned nurse who states that no discharge date has been set. After assessment, CSW went to SNF discharge planner's office to receive updates but she was out of lunch. CSW called discharge planner and left a HIPPA compliant voice message.   Plan: CSW will assist patient with all social work needs as well as assist with her discharge planning. CSW will update RNCM once discharge takes place. CSW will await for updates from SNF discharge planner.  Dickie La, BSW, MSW, LCSW [email or web address] Phone:  [phone] Fax: [phone]

## 2015-10-20 ENCOUNTER — Other Ambulatory Visit: Payer: Self-pay | Admitting: Licensed Clinical Social Worker

## 2015-10-20 NOTE — Patient Outreach (Signed)
[facility]) Care Management  10/20/2015  Madeline Serrano 02/19/1930 [id number]   Assessment-CSW [facility] Natasha on 10/20/15. Discharge planner states that there is no discharge date set yet for patient. Marcelle Smilingatasha states no new concerns or problems to report.  Plan-CSW will continue to follow patient and ensure that she has a stable transition back home from SNF.  Dickie LaBrooke Carylon Tamburro, BSW, MSW, LCSW [facility] Care Management [email or web address] Phone: [phone] Fax: [phone]

## 2015-10-27 ENCOUNTER — Other Ambulatory Visit: Payer: Self-pay | Admitting: Licensed Clinical Social Worker

## 2015-10-27 NOTE — Patient Outreach (Signed)
[facility]) Care Management  10/27/2015  Madeline Serrano 05-May-1930 [id number]   Assessment- CSW completed three outreaches to SNF on 10/27/15 in order to gain discharge planning updates on patient. CSW was able to discuss patient's care with Ardeth Sportsman Director at [employer]. Dewayne Hatch was made aware that this CSW has had difficulty gaining appropriate updates on patient. Ann provided CSW with emails of discharge planners.  Dewayne Hatch shares that patient had a fall not long ago during therapy and broke her arm. Dewayne Hatch shared that she was doing very well and making progress but since fall has had a lot of set backs. Ann spoke of patient's recent mood change stating "She seems to be experiencing some depression. I have talked to the NP in order to see if an antidepressant could help. I'm not sure if that has taken place yet." Dewayne Hatch went on to say that patient feels like a burden at the facility and with her family and does not feel like she can get better. Patient continues to have blood pressure issues and her vision is getting worse.   Dewayne Hatch states that there are no updates on discharge planning but that the goal continue to be to discharge back home. However, patient may need additional time at SNF in order to do that. Dewayne Hatch suggested that CSW email discharge planners in order to gain further discharge planning updates.  CSW sent email to both discharge planner requesting that they contact CSW by phone or email with any further updates on discharge planning.  Plan-CSW will make next outreach by 11/04/15 to gain further updates.  Dickie La, BSW, MSW, LCSW [email or web address] Phone: [phone] Fax: [phone]

## 2015-11-03 ENCOUNTER — Other Ambulatory Visit: Payer: Self-pay | Admitting: Licensed Clinical Social Worker

## 2015-11-03 NOTE — Patient Outreach (Signed)
[facility]) Care Management  11/03/2015  OLINE BELK Feb 22, 1930 [id number]   Assessment-CSW completed outreach to [name] on 11/03/15 and spoke with discharge planner. Discharge planner states that patient's set discharge date is for 11/11/15. Discharge planner shares that family is unsure if patient will return home or will be placed at ALF. Physical therapy recommended that patient stay somewhere long term. Discharge planner states that they are awaiting to hear back from family in order to schedule next care plan meeting.  CSW completed outreach to son [name] to provide updates. [name] was not aware of a discussion on ALF placement or of her expected discharge date. [name] expresses fear for her discharge home due to patient's recent set backs since her fall. CSW spent time reviewing available care options such as [facility] [employer], private pay care aides within the home and ALF placement. Patient would not qualify for Medicaid and [name] understands that patient would have to pay out of pocket for ALF placement. [name] shares that he will contact his brother to further discuss patient's options. [name] is currently at the airport and needed to get off the phone. [name] will return home from trip on 11/07/15. [name] wishes for CSW to contact him at that time.   Plan-CSW will update RNCM. CSW will continue to provide social work assistance to patient and family. CSW will make outreach on 11/10/15.  Dickie La, BSW, MSW, LCSW [email or web address] Phone: [phone] Fax: [phone]

## 2015-11-11 ENCOUNTER — Other Ambulatory Visit: Payer: Self-pay | Admitting: Licensed Clinical Social Worker

## 2015-11-11 NOTE — Patient Outreach (Signed)
[facility]) Care Management  11/11/2015  Madeline Serrano 01-24-1930 [id number]   Assessment-CSW completed outreach to SNF discharge planner on 11/11/15. Patient's discharge date was set back to 11/14/15. Discharge planner states that patient will be staying at there residence and her son [name] will be staying with her (as he was before) but son also travels with work. Patient will continue to have her aide but the hours are days are unknown. Patient will go home with home health with [facility].  CSW completed outreach to patient's son afterwards. Son reports that he has been very dissatisfied with patient's stay at SNF. CSW provided reflective listening. Son states that patient's arm is currently in bad condition. He shares that patient is in need of a wheel chair and of bed rails. Son states that he will ensure that she has everything that she needs to be able to stay safely at home. CSW questioned if she will be receiving more personal care services. Son shares that patient has denied needing any more additional hours with her current aide. Son reports that although he is unsure he wishes for his mother to make her own decisions. CSW expressed her concerns. Son reports "She is very stubborn at times. I think we are going to have to do trial and error with this and see what works and what doesn't."  Plan-CSW will update RNCM of new discharge date. CSW will make outreach to patient's son on 11/17/15.  Dickie La, BSW, MSW, LCSW [email or web address] Phone: [phone] Fax: [phone]

## 2015-11-17 ENCOUNTER — Other Ambulatory Visit: Payer: Self-pay

## 2015-11-17 ENCOUNTER — Other Ambulatory Visit: Payer: Self-pay | Admitting: Licensed Clinical Social Worker

## 2015-11-17 NOTE — Patient Outreach (Signed)
[facility]) Care Management  11/17/2015  Madeline Serrano August 13, 1930 [id number]   Assessment-CSW completed outreach to patient's son Madeline Serrano on 11/17/15. Patient's son answered and stated "I'm very overwhelmed right now. I can't keep up with all of these people calling me. I am confused on who is doing what so I don't know what to tell you. Son states that he is currently at home with his mother. He reports that it was a very difficult weekend. He does not confirm or deny that there were any falls since her discharge home from SNF on 11/15/15. Son states that home health will be starting soon. He states that she does not currently have an aide as her previous aide that she built strong rapport with has gained a new job. He reports "She doesn't want to have to start all over again with another aide." However, son reports that he is currently making calls to other agencies. He reports that she does have a wheelchair now. Son states that he has not heard from [facility] nurse and CSW explained that she will be in contact with patient and family very soon. Son then stated "The longer it takes to hear from you guys the less I am interested in your program." CSW will update RNCM.  Plan-CSW will continue to provide social work assistance. CSW will encourage family to gain personal care services sooner than later. CSW will update RNCM.  Madeline Serrano, BSW, MSW, LCSW [email or web address] Phone: [phone] Fax: [phone]

## 2015-11-17 NOTE — Patient Outreach (Addendum)
[facility]) Care Management  11/17/2015  Madeline Serrano September 25, 1930 [id number]  Discharged from [facility] on 2/10. RNCM called for transition of care. Member answered the phone. Confirmed two patient identifiers with patient. RNCM discussed [facility] Care Management services. Member reports [name] was there with her and confirmed that a bath aide was going to be coming out on Monday, Wednesday and Friday; and a therapist was going to start, but is unable to state the name of the agency. RNCM attempted to see member tomorrow, but member has an appointment scheduled for tomorrow. Member allowed [facility] to speak with son, Madeline Serrano, after scheduling a home visit with RNCM. RNCM explained the reason for the call to [name] and referred to [facility] social worker, [name]. [name] was receptive to scheduling a home visit. Unable to complete all questions, due to home health nurse completing her home visit.  Plan: Home visit scheduled for 2/16.  Kathyrn Sheriff, RN, MSN, [facility] Community Care Coordinator Cell: [phone]

## 2015-11-20 ENCOUNTER — Other Ambulatory Visit: Payer: Self-pay

## 2015-11-20 ENCOUNTER — Ambulatory Visit: Payer: Self-pay

## 2015-11-21 ENCOUNTER — Other Ambulatory Visit: Payer: Self-pay | Admitting: Licensed Clinical Social Worker

## 2015-11-21 NOTE — Patient Outreach (Signed)
[facility]) Care Management  11/21/2015  Madeline Serrano Feb 13, 1930 [id number]   Assessment-CSW received call from [facility] on 11-29-2015. RNCM informed CSW that patient's son stated that patient passed away during the night of 11/19/15.   Plan-CSW will complete case closure at this time. CSW will notify [facility] Care Management Assistant.  Madeline Serrano, BSW, MSW, LCSW [email or web address] Phone: [phone] Fax: [phone]

## 2015-11-28 ENCOUNTER — Ambulatory Visit: Payer: BLUE CROSS/BLUE SHIELD | Admitting: Cardiology

## 2015-12-03 NOTE — Patient Outreach (Signed)
[facility]) Care Management  2015-12-13  CLYTEE HEINRICH 01-Jun-1930 [id number]  RNCM had a scheduled initial home visit today-RNCM called to patient's home to confirm RNCM was on the way. Son, Finlee Concepcion stated that Ms. Ebers had passed during the night. RNCM offered support.   Case closed, send case closure letter.  Kathyrn Sheriff, RN, MSN, [facility] Community Care Coordinator Cell: [phone]

## 2015-12-03 DEATH — deceased

## 2016-05-17 IMAGING — MR MR FEMUR*L* W/O CM
5 series · 40 of 40 positions shown · non-contrast
Comparison: CT 08/16/2014.

CLINICAL DATA: LEFT thigh mass identified on prior CT.

EXAM:
MR OF THE LEFT FEMUR WITHOUT CONTRAST
TECHNIQUE: Multiplanar, multisequence MR imaging was performed. No intravenous
contrast was administered.

[Series 7: T2 fat-sat · axial · 5.0mm · 1.25mm/px · z∈[-107,+36]mm · 8 of 24 slices shown (1 of 3)]
[im 1/24]
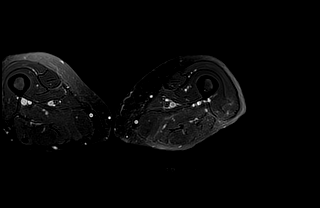
[im 4/24]
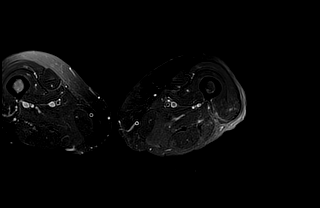
[im 7/24]
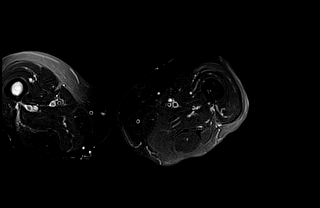
[im 10/24]
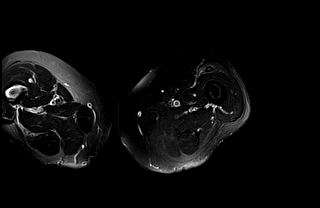
[im 14/24]
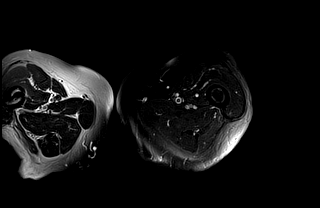
[im 17/24]
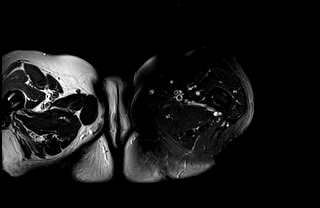
[im 20/24]
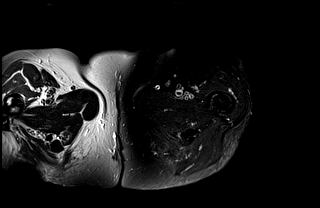
[im 24/24]
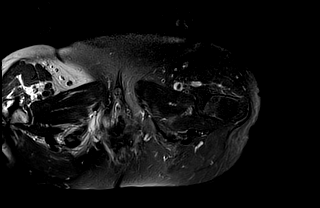

[Series 8: T2 fat-sat · sagittal · 6.0mm · 1.17mm/px · 7 of 24 slices shown (2 of 3)]
[im 1/24]
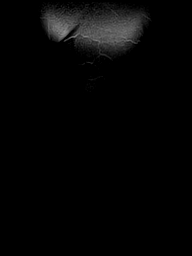
[im 4/24]
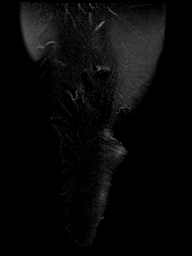
[im 8/24]
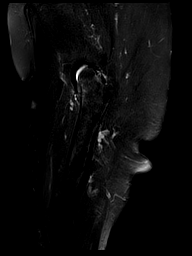
[im 12/24]
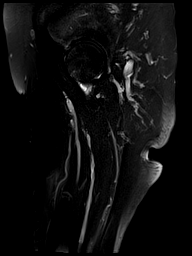
[im 16/24]
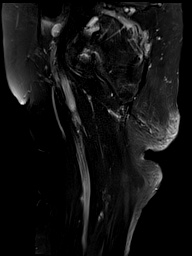
[im 20/24]
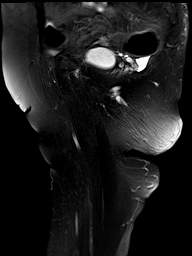
[im 24/24]
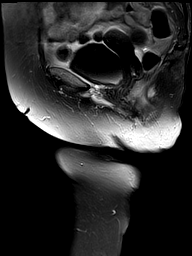

[Series 10: T1 · axial · 5.0mm · 0.86mm/px · z∈[-79,+130]mm · 12 of 39 slices shown (1 of 2)]
[im 1/39]
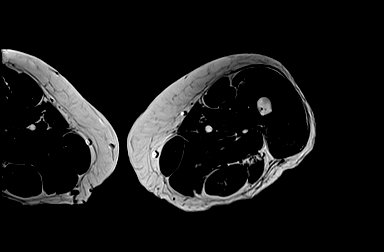
[im 4/39]
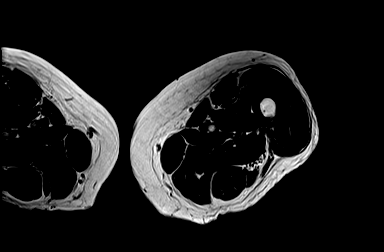
[im 7/39]
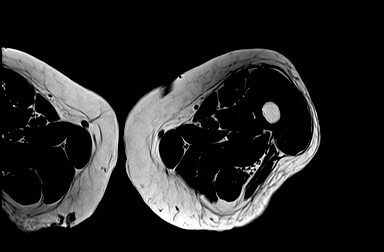
[im 11/39]
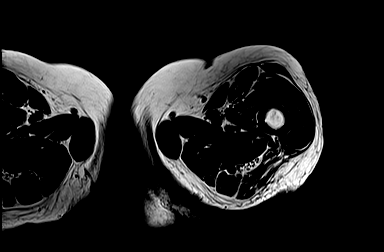
[im 14/39]
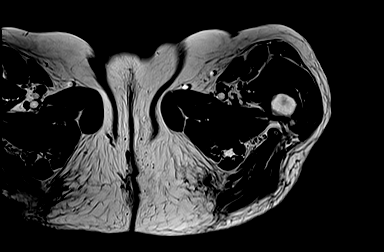
[im 18/39]
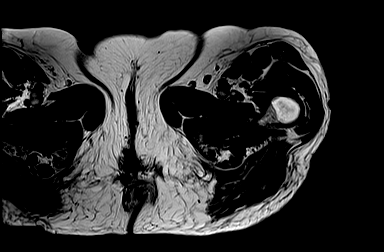
[im 21/39]
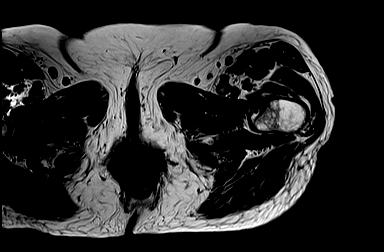
[im 25/39]
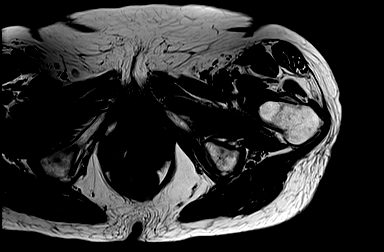
[im 28/39]
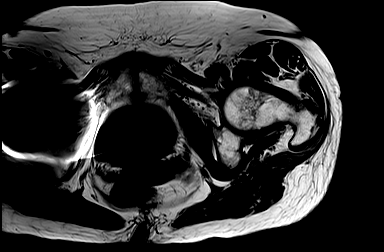
[im 32/39]
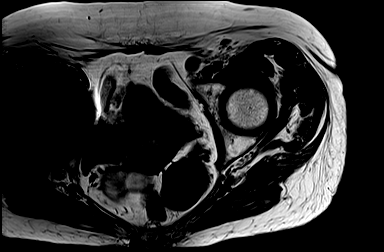
[im 35/39]
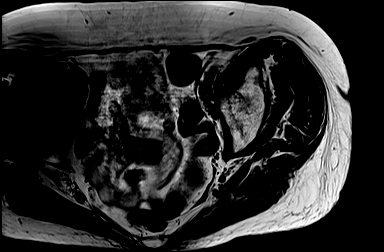
[im 39/39]
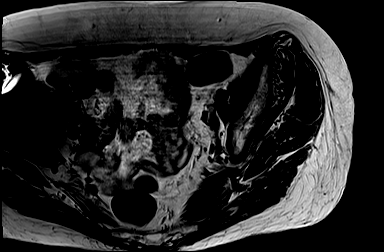

[Series 11: T1 · sagittal · 5.0mm · 0.47mm/px · 7 of 24 slices shown (2 of 2)]
[im 1/24]
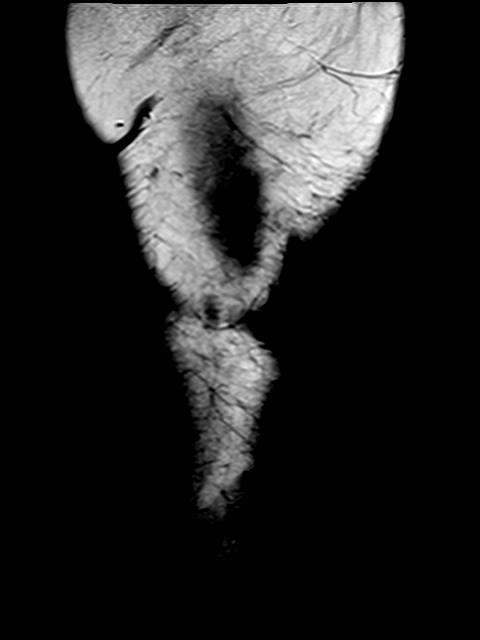
[im 4/24]
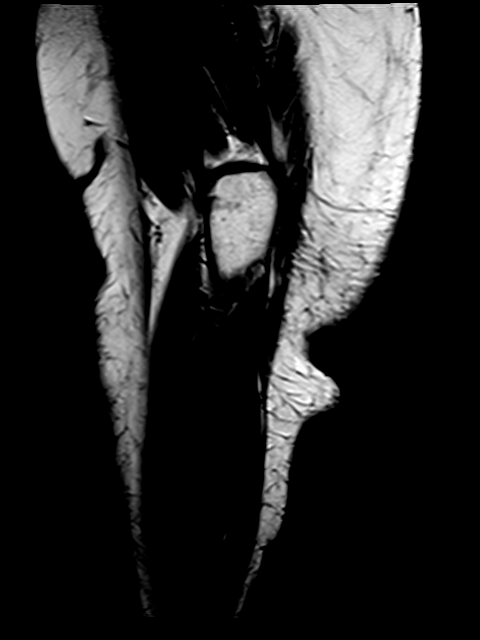
[im 8/24]
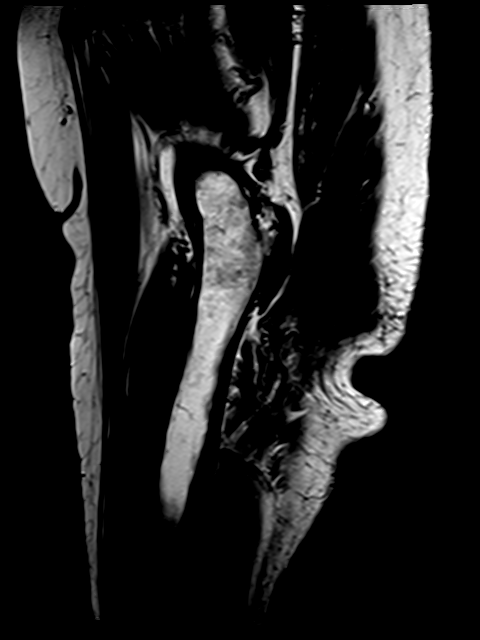
[im 12/24]
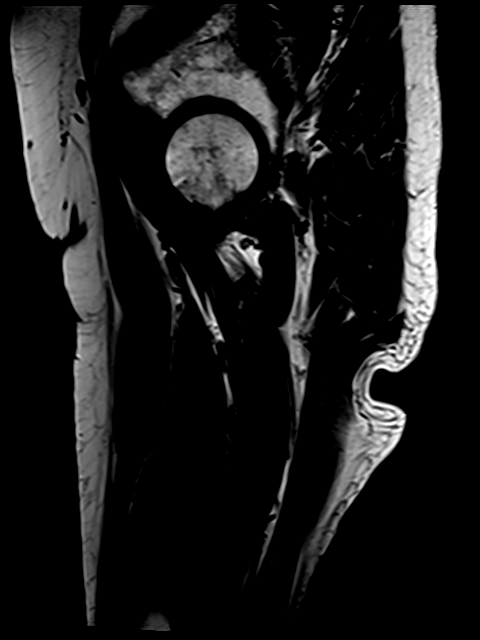
[im 16/24]
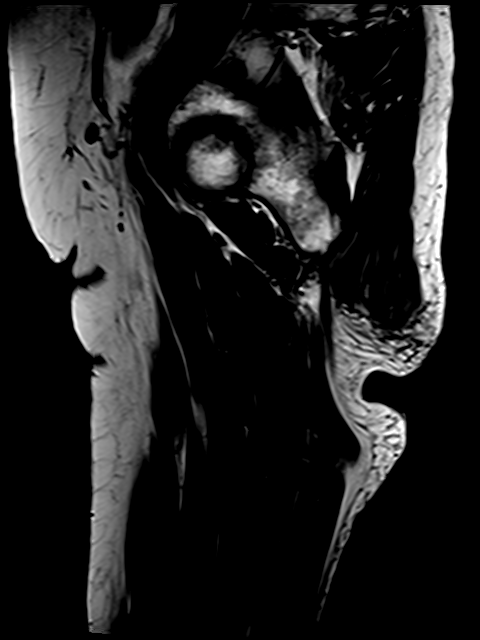
[im 20/24]
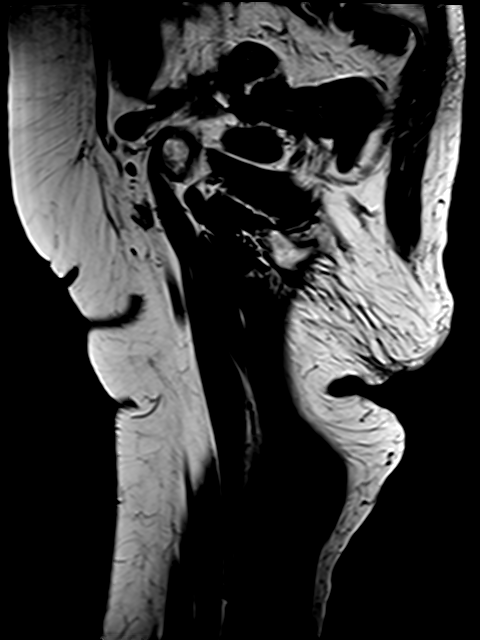
[im 24/24]
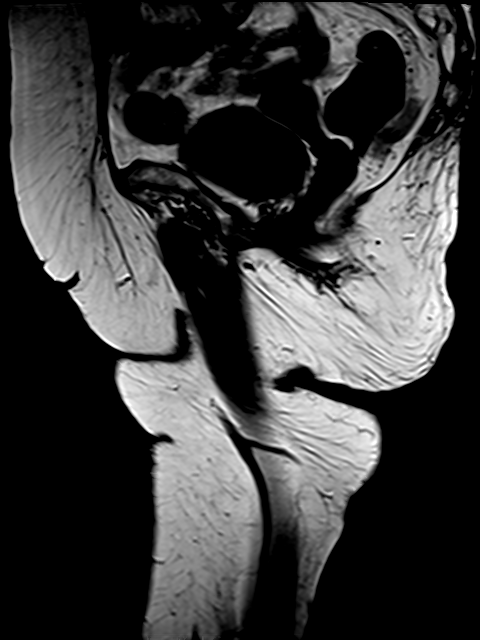

[Series 12: T2 fat-sat · coronal · 5.0mm · 1.12mm/px · 6 of 18 slices shown (3 of 3)]
[im 1/18]
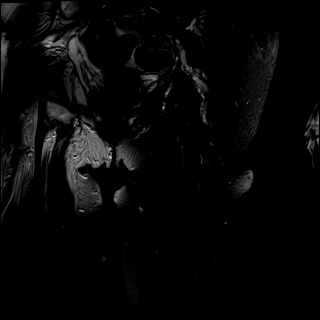
[im 4/18]
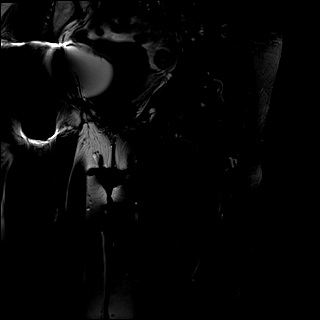
[im 7/18]
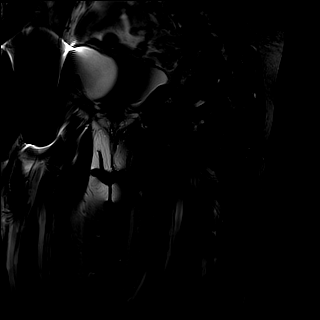
[im 11/18]
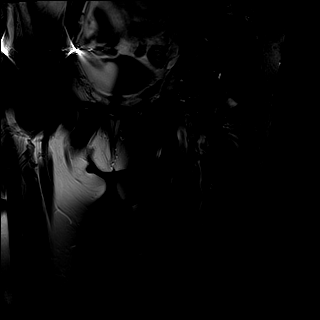
[im 14/18]
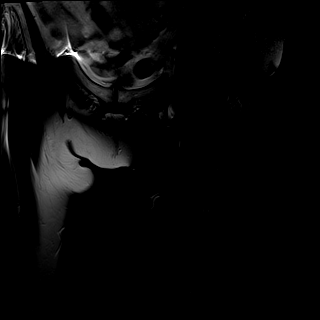
[im 18/18]
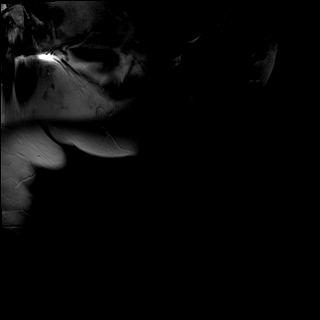

[40 of 40 positions shown; findings below may reference images not displayed]

FINDINGS: The study was ordered with and without contrast for assessment of
mass however the patient refused IV contrast. Noncontrast imaging
was performed.

The lesion has decreased in size. This is in the proximal rectus
femoris muscle. On today's exam, this is tiny and measures 13 mm
transverse by 7 mm AP. There is a very subtle surrounding rim of
edema. Craniocaudal measurement is 16 mm.

Previously this measured 16 mm x 25 mm x 39 mm and even allowing for
differences in the techniques, this is almost certainly smaller.
This does not have aggressive features and the appearance is most
compatible with a small intramuscular hematoma, particularly with
the low signal rim surrounding the lesion, likely representing
hemosiderin deposition. Additionally, on T1 weighted imaging, this
has intrinsic high signal, also consistent with blood products.
Correlating with the prior CT, the iliac lymph nodes in were
probably reactive to this resolving intramuscular hematoma.
IMPRESSION: Palpable lesion in the proximal rectus femoris muscle is smaller,
and is consistent with resolving intramuscular hematoma.

## 2019-03-02 ENCOUNTER — Other Ambulatory Visit: Payer: Self-pay
